# Patient Record
Sex: Female | Born: 1955 | Race: Black or African American | Hispanic: No | Marital: Married | Smoking: Never smoker
Health system: Southern US, Community
[De-identification: ages and names within clinical notes are randomized; demographics above are authoritative.]

## PROBLEM LIST (undated history)

## (undated) DIAGNOSIS — K750 Abscess of liver: Secondary | ICD-10-CM

## (undated) DIAGNOSIS — I10 Essential (primary) hypertension: Secondary | ICD-10-CM

---

## 1998-09-07 ENCOUNTER — Other Ambulatory Visit: Admission: RE | Admit: 1998-09-07 | Discharge: 1998-09-07 | Payer: Self-pay | Admitting: Obstetrics & Gynecology

## 1999-11-02 ENCOUNTER — Other Ambulatory Visit: Admission: RE | Admit: 1999-11-02 | Discharge: 1999-11-02 | Payer: Self-pay | Admitting: Obstetrics & Gynecology

## 2000-09-20 ENCOUNTER — Encounter: Admission: RE | Admit: 2000-09-20 | Discharge: 2000-11-20 | Payer: Self-pay | Admitting: Internal Medicine

## 2000-11-13 ENCOUNTER — Other Ambulatory Visit: Admission: RE | Admit: 2000-11-13 | Discharge: 2000-11-13 | Payer: Self-pay | Admitting: Obstetrics & Gynecology

## 2002-01-13 ENCOUNTER — Other Ambulatory Visit: Admission: RE | Admit: 2002-01-13 | Discharge: 2002-01-13 | Payer: Self-pay | Admitting: Obstetrics & Gynecology

## 2003-02-17 ENCOUNTER — Other Ambulatory Visit: Admission: RE | Admit: 2003-02-17 | Discharge: 2003-02-17 | Payer: Self-pay | Admitting: Obstetrics & Gynecology

## 2004-03-16 ENCOUNTER — Other Ambulatory Visit: Admission: RE | Admit: 2004-03-16 | Discharge: 2004-03-16 | Payer: Self-pay | Admitting: Obstetrics & Gynecology

## 2005-07-21 ENCOUNTER — Other Ambulatory Visit: Admission: RE | Admit: 2005-07-21 | Discharge: 2005-07-21 | Payer: Self-pay | Admitting: Obstetrics & Gynecology

## 2012-07-24 ENCOUNTER — Other Ambulatory Visit: Payer: Self-pay | Admitting: Radiology

## 2012-08-05 ENCOUNTER — Other Ambulatory Visit: Payer: Self-pay

## 2012-11-01 ENCOUNTER — Telehealth: Payer: Self-pay | Admitting: Hematology & Oncology

## 2012-11-01 NOTE — Telephone Encounter (Signed)
Left pt message to call and schedule appointment °

## 2012-11-01 NOTE — Telephone Encounter (Signed)
Pt aware of 12-02-12 appointment

## 2012-12-02 ENCOUNTER — Ambulatory Visit (HOSPITAL_BASED_OUTPATIENT_CLINIC_OR_DEPARTMENT_OTHER): Admitting: Hematology & Oncology

## 2012-12-02 ENCOUNTER — Other Ambulatory Visit (HOSPITAL_BASED_OUTPATIENT_CLINIC_OR_DEPARTMENT_OTHER): Admitting: Lab

## 2012-12-02 ENCOUNTER — Ambulatory Visit

## 2012-12-02 VITALS — BP 150/83 | HR 76 | Temp 98.4°F | Resp 16 | Ht 67.0 in | Wt 181.0 lb

## 2012-12-02 DIAGNOSIS — D693 Immune thrombocytopenic purpura: Secondary | ICD-10-CM

## 2012-12-02 LAB — CBC WITH DIFFERENTIAL (CANCER CENTER ONLY)
BASO#: 0 10*3/uL (ref 0.0–0.2)
HCT: 40.9 % (ref 34.8–46.6)
HGB: 13.8 g/dL (ref 11.6–15.9)
LYMPH#: 1.6 10*3/uL (ref 0.9–3.3)
MONO#: 0.4 10*3/uL (ref 0.1–0.9)
NEUT%: 66.7 % (ref 39.6–80.0)
WBC: 6.4 10*3/uL (ref 3.9–10.0)

## 2012-12-02 NOTE — Progress Notes (Signed)
This office note has been dictated.

## 2012-12-03 NOTE — Progress Notes (Signed)
CC:   Lisa Housekeeper, MD  DIAGNOSIS:  Transient thrombocytopenia.  HISTORY OF PRESENT ILLNESS:  Lisa Bauer is a very nice 57 year old African American female.  She has been very healthy.  She is followed by Dr. Donette Larry.  She has a history of high blood pressure.  She had some routine lab work done for her high blood pressure followup.  This was back in May.  Dr. Donette Larry saw that her platelet count was mildly depressed.  At that point in time, her CBC showed a white count of 5.8, hemoglobin 12.4, hematocrit 36.9, platelet count 137.  MCV was 89.  She had a normal white cell differential.  Lisa Bauer has not had any problems with bleeding or bruising.  She has had no weight loss or weight gain.  She is not a vegetarian.  There has been no change in medications.  She has had no dysphagia or odynophagia.  Of note, I think back in April, her CBC was done which showed a white cell count of 4.2, hemoglobin 13.1, hematocrit 39.1, and platelet count 138.  At that point in time, she had normal electrolytes.  Again, she was kindly referred to the [facility] for an evaluation.  She does have her routine mammograms.  She had her colonoscopy back in 2009.  She has had no rashes.  There has been no change in bowel or bladder habits.  There has been no bony pain.  She has had no dysphagia or odynophagia.  PAST MEDICAL HISTORY:  Hypertension.  ALLERGIES:  None.  MEDICATIONS:  Lisinopril 20 mg p.o. daily.  SOCIAL HISTORY:  Negative for tobacco use.  There is rare alcohol use. She has no obvious occupational exposures.  She previously worked for [employer].  FAMILY HISTORY:  Negative for any hematologic issue.  There is a cousin with sickle cell trait.  There is hypertension in the family.  REVIEW OF SYSTEMS:  As stated in history of present illness.  No additional findings noted on a 12-system review.  PHYSICAL EXAMINATION:  General:  This is a well-developed,  well- nourished African American female in no obvious distress.  Vital signs: Temperature 98.4, pulse 76, respiratory rate 16, blood pressure 150/83. Weight is 181.  Head and Neck:  Normocephalic, atraumatic skull.  There are no ocular or oral lesions.  There are no palpable cervical or supraclavicular lymph nodes.  Lungs:  Clear bilaterally.  Cardiac: Regular rate and rhythm with a normal S1, S2.  There are no murmurs, rubs or bruits.  Abdomen:  Soft with good bowel sounds.  There is no palpable abdominal mass.  There is no fluid wave.  There is no palpable hepatosplenomegaly.  Back:  No tenderness over the spine, ribs, or hips. Extremities:  No clubbing, cyanosis or edema.  She has good range motion of her joints.  Neurologic:  No focal neurological deficits.  Skin:  No rashes, ecchymosis, or petechia.  LABORATORY STUDIES:  White cell count 6.4, hemoglobin 13.8, hematocrit 40.9, platelet count 148.  Peripheral smear shows a normochromic, normocytic population of red blood cells.  There are no nucleated red blood cells.  There are no teardrop cells.  There is no __________ target cells.  There is no spherocytes or schistocytes.  White cells appear normal in morphology and maturation.  There is no immature myeloid or lymphoid forms.  I see no atypical lymphocytes.  There is no hypersegmented polys.  There is no blasts.  Platelets are adequate in number and  size.  Platelets are well granulated.  She may have a few large platelets.  IMPRESSION:  Lisa Bauer is a very nice 57 year old African female with minimal thrombocytopenia.  This is transient.  I cannot find anything on her physical exam or on the blood smear that would suggest an underlying hematologic issue.  I just think that Lisa Bauer will have some fluctuations of her platelet count as a "norm."  I do not see a need for a bone marrow biopsy.  I do not see need for __________ radiological test.  I forgot to mention that  there is no risk factors for HIV or hepatitis. As such, I do not see that cirrhosis or splenomegaly is going to be an issue here.  Again, Lisa Bauer's platelet count is normal today.  I suspect that she is going to have some fluctuations with her platelets.  Her white cells were okay.  She is not anemic.  It is also very important for [place] to consider.  I spent a good hour with Lisa Bauer.  We had a very nice talk.  I reviewed her lab work with her.  I explained my recommendations for her.  As nice as Lisa Bauer is, I do not think we need to get her back to the office.    ______________________________ Josph Macho, M.D. PRE/MEDQ  D:  12/02/2012  T:  12/03/2012  Job:  [id number]

## 2013-08-28 ENCOUNTER — Other Ambulatory Visit: Payer: Self-pay

## 2014-10-01 ENCOUNTER — Other Ambulatory Visit: Payer: Self-pay | Admitting: Internal Medicine

## 2014-10-01 ENCOUNTER — Ambulatory Visit
Admission: RE | Admit: 2014-10-01 | Discharge: 2014-10-01 | Disposition: A | Discharge status: Home / Self Care | Source: Ambulatory Visit | Attending: Internal Medicine | Admitting: Internal Medicine

## 2014-10-01 DIAGNOSIS — M545 Low back pain: Secondary | ICD-10-CM

## 2016-05-02 ENCOUNTER — Other Ambulatory Visit: Payer: Self-pay | Admitting: Internal Medicine

## 2016-05-02 DIAGNOSIS — R221 Localized swelling, mass and lump, neck: Secondary | ICD-10-CM

## 2016-05-08 ENCOUNTER — Ambulatory Visit
Admission: RE | Admit: 2016-05-08 | Discharge: 2016-05-08 | Disposition: A | Discharge status: Home / Self Care | Source: Ambulatory Visit | Attending: Internal Medicine | Admitting: Internal Medicine

## 2016-05-08 DIAGNOSIS — R221 Localized swelling, mass and lump, neck: Secondary | ICD-10-CM

## 2016-09-23 ENCOUNTER — Emergency Department (HOSPITAL_COMMUNITY)

## 2016-09-23 ENCOUNTER — Other Ambulatory Visit: Payer: Self-pay

## 2016-09-23 ENCOUNTER — Encounter (HOSPITAL_COMMUNITY): Payer: Self-pay | Admitting: *Deleted

## 2016-09-23 ENCOUNTER — Emergency Department (HOSPITAL_COMMUNITY)
Admission: EM | Admit: 2016-09-23 | Discharge: 2016-09-24 | Disposition: A | Discharge status: Home / Self Care | Attending: Emergency Medicine | Admitting: Emergency Medicine

## 2016-09-23 DIAGNOSIS — M79662 Pain in left lower leg: Secondary | ICD-10-CM | POA: Insufficient documentation

## 2016-09-23 DIAGNOSIS — Z79899 Other long term (current) drug therapy: Secondary | ICD-10-CM | POA: Diagnosis not present

## 2016-09-23 DIAGNOSIS — I10 Essential (primary) hypertension: Secondary | ICD-10-CM | POA: Diagnosis not present

## 2016-09-23 DIAGNOSIS — R079 Chest pain, unspecified: Secondary | ICD-10-CM | POA: Diagnosis not present

## 2016-09-23 HISTORY — DX: Essential (primary) hypertension: I10

## 2016-09-23 LAB — CBC
HEMATOCRIT: 40.5 % (ref 36.0–46.0)
Hemoglobin: 13.5 g/dL (ref 12.0–15.0)
MCH: 29.2 pg (ref 26.0–34.0)
MCHC: 33.3 g/dL (ref 30.0–36.0)
MCV: 87.5 fL (ref 78.0–100.0)
Platelets: 153 10*3/uL (ref 150–400)
RBC: 4.63 MIL/uL (ref 3.87–5.11)
RDW: 13.7 % (ref 11.5–15.5)
WBC: 6.3 10*3/uL (ref 4.0–10.5)

## 2016-09-23 LAB — BASIC METABOLIC PANEL
Anion gap: 9 (ref 5–15)
BUN: 13 mg/dL (ref 6–20)
CO2: 28 mmol/L (ref 22–32)
CREATININE: 0.61 mg/dL (ref 0.44–1.00)
Calcium: 8.9 mg/dL (ref 8.9–10.3)
Chloride: 102 mmol/L (ref 101–111)
GFR calc Af Amer: >60 mL/min (ref >60)
GFR calc non Af Amer: >60 mL/min (ref >60)
GLUCOSE: 112 mg/dL — AB (ref 65–99)
Potassium: 3.5 mmol/L (ref 3.5–5.1)
Sodium: 139 mmol/L (ref 135–145)

## 2016-09-23 LAB — I-STAT TROPONIN, ED
TROPONIN I, POC: 0 ng/mL (ref 0.00–0.08)
Troponin i, poc: 0 ng/mL (ref 0.00–0.08)

## 2016-09-23 LAB — D-DIMER, QUANTITATIVE (NOT AT ARMC): D DIMER QUANT: 0.7 ug{FEU}/mL — AB (ref 0.00–0.50)

## 2016-09-23 NOTE — ED Provider Notes (Signed)
[facility] DEPT Provider Note   CSN: [id number] Arrival date & time: 09/23/16  1831     History   Chief Complaint Chief Complaint  Patient presents with  . Chest Pain    HPI Lisa Bauer is a 61 y.o. female.  HPI  Off and on for 3 days, comes and goes, sharp pain in the middle of the chest.  Feels like the sharp pain "catches my breath. " But doesn't necessarily feel shortness of breath, no dizziness.  No nausea or vomiting.  Sometimes radiates to shoulder and arm.  No sweating. Noticed some calf pain on left side today.  Nothing makes chest pain better or worse, not worse with exertion. Not worse with deep breaths.  Had pain like this 30 years ago and came to ED.  Pain 5/10 at the worst, lasts for minute or two. Not constant.   No chol, DM, smoking, no other drug use No recent surgeries, car/airplan trips, estrogen use, hx DVT/PE  Father died of MI in 31s    Past Medical History:  Diagnosis Date  . Hypertension     There are no active problems to display for this patient.   History reviewed. No pertinent surgical history.  OB History    No data available       Home Medications    Prior to Admission medications   Medication Sig Start Date End Date Taking? Authorizing Provider  lisinopril-hydrochlorothiazide (PRINZIDE,ZESTORETIC) 20-25 MG tablet Take 1 tablet by mouth daily. 07/14/16  Yes Historical Provider, MD    Family History History reviewed. No pertinent family history.  Social History Social History  Substance Use Topics  . Smoking status: Never Smoker  . Smokeless tobacco: Not on file  . Alcohol use No     Allergies   Patient has no known allergies.   Review of Systems Review of Systems  Constitutional: Negative for fever.  HENT: Negative for sore throat.   Eyes: Negative for visual disturbance.  Respiratory: Negative for cough and shortness of breath.   Cardiovascular: Positive for chest pain. Negative for leg swelling.    Gastrointestinal: Negative for abdominal pain, diarrhea, nausea and vomiting.  Genitourinary: Negative for difficulty urinating and dysuria.  Musculoskeletal: Negative for neck pain.  Skin: Negative for rash.  Neurological: Negative for syncope and headaches.     Physical Exam Updated Vital Signs BP (!) 140/96   Pulse (!) 53   Temp 98.4 F (36.9 C) (Oral)   Resp 13   Wt 175 lb (79.4 kg)   SpO2 98%   BMI 27.41 kg/m   Physical Exam  Constitutional: She is oriented to person, place, and time. She appears well-developed and well-nourished. No distress.  HENT:  Head: Normocephalic and atraumatic.  Eyes: Conjunctivae and EOM are normal.  Neck: Normal range of motion.  Cardiovascular: Normal rate, regular rhythm, normal heart sounds and intact distal pulses.  Exam reveals no gallop and no friction rub.   No murmur heard. Pulmonary/Chest: Effort normal and breath sounds normal. No respiratory distress. She has no wheezes. She has no rales.  Abdominal: Soft. She exhibits no distension. There is no tenderness. There is no guarding.  Musculoskeletal: She exhibits no edema or tenderness.  Mild tenderness left lower calf on initial exam  Neurological: She is alert and oriented to person, place, and time.  Skin: Skin is warm and dry. No rash noted. She is not diaphoretic. No erythema.  Nursing note and vitals reviewed.    ED Treatments /  Results  Labs (all labs ordered are listed, but only abnormal results are displayed) Labs Reviewed  BASIC METABOLIC PANEL - Abnormal; Notable for the following:       Result Value   Glucose, Bld 112 (*)    All other components within normal limits  D-DIMER, QUANTITATIVE (NOT AT [facility]) - Abnormal; Notable for the following:    D-Dimer, Quant 0.70 (*)    All other components within normal limits  CBC  I-STAT TROPOININ, ED  I-STAT TROPOININ, ED    EKG  EKG Interpretation  Date/Time:  Saturday September 23 2016 18:34:35 EDT Ventricular Rate:   65 PR Interval:  164 QRS Duration: 100 QT Interval:  430 QTC Calculation: 447 R Axis:   -9 Text Interpretation:  Normal sinus rhythm Right atrial enlargement Incomplete right bundle branch block Moderate voltage criteria for LVH, may be normal variant Borderline ECG No previous ECGs available Confirmed by [facility] MD, Katyra Tomassetti ([id number]) on 09/23/2016 10:44:58 PM       Radiology Dg Chest 2 View  Result Date: 09/23/2016 CLINICAL DATA:  61 year old female with history of intermittent midsternal chest pain for the past 2-3 days. EXAM: CHEST  2 VIEW COMPARISON:  No priors. FINDINGS: Lung volumes are normal. No consolidative airspace disease. No pleural effusions. No pneumothorax. No pulmonary nodule or mass noted. Pulmonary vasculature and the cardiomediastinal silhouette are within normal limits. IMPRESSION: No radiographic evidence of acute cardiopulmonary disease. Electronically Signed   By: Vinnie Langton M.D.   On: 09/23/2016 19:23   Ct Angio Chest Pe W And/or Wo Contrast  Result Date: 09/24/2016 CLINICAL DATA:  Initial evaluation for intermittent chest pain for 3 days. 80 cc of Isovue 370. EXAM: CT ANGIOGRAPHY CHEST WITH CONTRAST TECHNIQUE: Multidetector CT imaging of the chest was performed using the standard protocol during bolus administration of intravenous contrast. Multiplanar CT image reconstructions and MIPs were obtained to evaluate the vascular anatomy. CONTRAST:  Prior radiograph from 09/23/2016. COMPARISON:  None. FINDINGS: Cardiovascular: Ascending aorta dilated up to 4.1 cm in diameter. Minimal plaque within the aortic arch. Mild cardiomegaly. No pericardial effusion. Pulmonary arterial tree adequately opacified for evaluation. Main pulmonary artery measures within normal limits for size. No filling defect to suggest acute pulmonary embolism. Re-formatted imaging confirms these findings. Mediastinum/Nodes: Partially visualized thyroid is normal. No pathologically enlarged mediastinal,  hilar, or axillary lymph nodes identified. Esophagus within normal limits. Lungs/Pleura: Tracheobronchial tree is patent. Mild hazy subsegmental atelectasis seen dependently within the lower lobes bilaterally. Lungs are otherwise clear. No focal infiltrates. No pulmonary edema or pleural effusion. No worrisome pulmonary nodule or mass. No pneumothorax. Upper Abdomen: Visualized upper abdomen within normal limits. Musculoskeletal: No acute osseus abnormality. No worrisome lytic or blastic osseous lesions. Few scattered breast calcifications noted. Review of the MIP images confirms the above findings. IMPRESSION: 1. No CT evidence for acute pulmonary embolism. 2. No other acute cardiopulmonary abnormality identified. 3. 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 Recommend annual imaging followup by CTA or MRA. This recommendation follows 2010 ACCF/AHA/AATS/ACR/ASA/SCA/SCAI/SIR/STS/SVM Guidelines for the Diagnosis and Management of Patients with Thoracic Aortic Disease. Circulation. 2010; 121: Y248-G500 Electronically Signed   By: Jeannine Boga M.D.   On: 09/24/2016 02:16    Procedures Procedures (including critical care time)  Medications Ordered in ED Medications  iopamidol (ISOVUE-370) 76 % injection (100 mLs  Contrast Given 09/24/16 0059)  enoxaparin (LOVENOX) injection 80 mg (80 mg Subcutaneous Given 09/24/16 0145)     Initial Impression / Assessment and Plan /  ED Course  I have reviewed the triage vital signs and the nursing notes.  Pertinent labs & imaging results that were available during my care of the patient were reviewed by me and considered in my medical decision making (see chart for details).     62 year old female with a history of hypertension, family history of coronary artery disease in her father in his 26s, presents with concern for intermittent chest pain over the last 3 days. EKG shows incomplete right bundle branch block without prior for comparison.  Given patient  reported left calf pain, with sharp chest pain, ordered d-dimer to evaluate for pulmonary embolus. D-dimer elevated, and CT PE study was done which showed no evidence of PE, however does show dilation of the ascending aorta which will require annual follow-up imaging. Discussed these results with patient.   Given patient's d-dimer, and initial left calf pain, discussed that the conservative plan would be to evaluate for DVT. Gave patient an empiric dose of Lovenox, and recommend return to the emergency department for further evaluation.  Patient is chest pain-free in the emergency department. She is a heart score of 3.  She has no exertional symptoms. Feel she is appropriate for outpatient management with her cardiologist, Dr. Tamala Julian. Recommend close follow-up with cardiology, as well as follow up with primary care physician and discussion of annual aortic imaging. Patient discharged in stable condition with understanding of reasons to return.   Final Clinical Impressions(s) / ED Diagnoses   Final diagnoses:  Chest pain, unspecified type  Pain of left lower leg    New Prescriptions Discharge Medication List as of 09/24/2016  2:20 AM       Gareth Morgan, MD 09/24/16 [phone]

## 2016-09-23 NOTE — ED Triage Notes (Signed)
Pt reports mid sharp chest pains intermittent for several days. Denies sob or n/v. No acute distress is noted at triage, ekg done.

## 2016-09-24 ENCOUNTER — Ambulatory Visit (HOSPITAL_BASED_OUTPATIENT_CLINIC_OR_DEPARTMENT_OTHER)
Admission: RE | Admit: 2016-09-24 | Discharge: 2016-09-24 | Disposition: A | Discharge status: Home / Self Care | Source: Ambulatory Visit | Attending: Emergency Medicine | Admitting: Emergency Medicine

## 2016-09-24 ENCOUNTER — Encounter (HOSPITAL_COMMUNITY): Payer: Self-pay | Admitting: Radiology

## 2016-09-24 ENCOUNTER — Emergency Department (HOSPITAL_COMMUNITY)

## 2016-09-24 DIAGNOSIS — M7989 Other specified soft tissue disorders: Secondary | ICD-10-CM | POA: Diagnosis not present

## 2016-09-24 DIAGNOSIS — M79605 Pain in left leg: Secondary | ICD-10-CM

## 2016-09-24 DIAGNOSIS — M79609 Pain in unspecified limb: Secondary | ICD-10-CM | POA: Diagnosis not present

## 2016-09-24 MED ORDER — ENOXAPARIN SODIUM 80 MG/0.8ML ~~LOC~~ SOLN
1.0000 mg/kg | Freq: Once | SUBCUTANEOUS | Status: AC
  Administered 2016-09-24: 02:00:00 80 mg via SUBCUTANEOUS
  Filled 2016-09-24: qty 0.8

## 2016-09-24 MED ORDER — IOPAMIDOL (ISOVUE-370) INJECTION 76%
INTRAVENOUS | Status: AC
  Administered 2016-09-24: 100 mL
  Filled 2016-09-24: qty 100

## 2016-09-24 NOTE — Progress Notes (Signed)
VASCULAR LAB PRELIMINARY  PRELIMINARY  PRELIMINARY  PRELIMINARY  Left lower extremity venous duplex completed.    Preliminary report:  There is no obvious evidence of DVT or SVT noted in the left lower extremity.   Tajah Noguchi, RVT 09/24/2016, 8:31 AM

## 2016-09-24 NOTE — ED Notes (Signed)
Patient transported to CT 

## 2016-09-24 NOTE — Discharge Instructions (Signed)
You have been evaluated in the emergency department for chest pain and leg pain. I see no sign of heart attack, blood clot in the lung, collapsed lung, pneumonia, or other immediate life-threatening cause of your chest pain at this time.  I recommend very close follow-up with your cardiologist for possible outpatient stress test and evaluation for heart disease.  Regarding your leg pain, I am reassured that it has self resolved, however in the setting of leg pain and an elevated d-dimer (blood clot number)-- the conservative course of action would be to obtain an ultrasound of your leg to evaluate for deep vein thrombosis tomorrow morning. Please return to the emergency department for increased chest pain, shortness of breath or other concerns.

## 2016-09-24 NOTE — ED Notes (Signed)
ED Provider at bedside. 

## 2016-09-24 NOTE — ED Notes (Signed)
Pt returned from CT and connected to the monitor 

## 2016-09-26 ENCOUNTER — Telehealth: Payer: Self-pay | Admitting: *Deleted

## 2016-09-26 DIAGNOSIS — I10 Essential (primary) hypertension: Secondary | ICD-10-CM

## 2016-09-26 DIAGNOSIS — R9431 Abnormal electrocardiogram [ECG] [EKG]: Secondary | ICD-10-CM

## 2016-09-26 DIAGNOSIS — R0789 Other chest pain: Secondary | ICD-10-CM

## 2016-09-26 NOTE — Telephone Encounter (Signed)
-----   Message from Belva Crome, MD sent at 09/25/2016  6:45 PM EDT ----- Regarding: Chest pain Recently seen in the emergency room with chest pain.  Has abnormal EKG suggesting LVH.  Needs to be scheduled for 2-D Doppler echocardiogram because of history of hypertension and EKG appearance of LVH.  Needs stress Myoview performed to rule out CAD in the setting of chest pain.

## 2016-09-26 NOTE — Telephone Encounter (Signed)
Spoke with pt and went over recommendations per Dr. Tamala Julian.  Went over instructions for Myoview.  Pt verbalized understanding and was in agreement with this plan.

## 2016-10-05 ENCOUNTER — Telehealth (HOSPITAL_COMMUNITY): Payer: Self-pay | Admitting: *Deleted

## 2016-10-05 NOTE — Telephone Encounter (Signed)
Patient given detailed instructions per Myocardial Perfusion Study Information Sheet for the test on 10/10/16 Patient notified to arrive 15 minutes early and that it is imperative to arrive on time for appointment to keep from having the test rescheduled.  If you need to cancel or reschedule your appointment, please call the office within 24 hours of your appointment. Failure to do so may result in a cancellation of your appointment, and a $50 no show fee. Patient verbalized understanding. Juliya Magill Jacqueline    

## 2016-10-10 ENCOUNTER — Ambulatory Visit (HOSPITAL_BASED_OUTPATIENT_CLINIC_OR_DEPARTMENT_OTHER)

## 2016-10-10 ENCOUNTER — Ambulatory Visit (HOSPITAL_COMMUNITY): Attending: Cardiovascular Disease

## 2016-10-10 ENCOUNTER — Other Ambulatory Visit: Payer: Self-pay

## 2016-10-10 DIAGNOSIS — R079 Chest pain, unspecified: Secondary | ICD-10-CM | POA: Diagnosis not present

## 2016-10-10 DIAGNOSIS — R9431 Abnormal electrocardiogram [ECG] [EKG]: Secondary | ICD-10-CM | POA: Insufficient documentation

## 2016-10-10 DIAGNOSIS — Z8249 Family history of ischemic heart disease and other diseases of the circulatory system: Secondary | ICD-10-CM | POA: Diagnosis not present

## 2016-10-10 DIAGNOSIS — R0789 Other chest pain: Secondary | ICD-10-CM

## 2016-10-10 DIAGNOSIS — I10 Essential (primary) hypertension: Secondary | ICD-10-CM | POA: Insufficient documentation

## 2016-10-10 LAB — MYOCARDIAL PERFUSION IMAGING
CHL CUP NUCLEAR SRS: 5
CHL CUP RESTING HR STRESS: 51 {beats}/min
CHL RATE OF PERCEIVED EXERTION: 18
CSEPED: 10 min
CSEPEDS: 30 s
Estimated workload: 12.5 METS
LV dias vol: 105 mL (ref 46–106)
LVSYSVOL: 34 mL
MPHR: 159 {beats}/min
NUC STRESS TID: 0.92
Peak HR: 139 {beats}/min
Percent HR: 87 %
RATE: 0.29
SDS: 3
SSS: 8

## 2016-10-10 MED ORDER — TECHNETIUM TC 99M TETROFOSMIN IV KIT
10.6000 | PACK | Freq: Once | INTRAVENOUS | Status: AC | PRN
  Administered 2016-10-10: 10.6 via INTRAVENOUS
  Filled 2016-10-10: qty 11

## 2016-10-10 MED ORDER — TECHNETIUM TC 99M TETROFOSMIN IV KIT
32.4000 | PACK | Freq: Once | INTRAVENOUS | Status: AC | PRN
  Administered 2016-10-10: 32.4 via INTRAVENOUS
  Filled 2016-10-10: qty 33

## 2016-10-20 ENCOUNTER — Ambulatory Visit: Admitting: Interventional Cardiology

## 2016-12-11 ENCOUNTER — Encounter: Payer: Self-pay | Admitting: Psychology

## 2017-04-03 ENCOUNTER — Encounter: Payer: Self-pay | Admitting: Psychology

## 2017-04-03 ENCOUNTER — Ambulatory Visit (INDEPENDENT_AMBULATORY_CARE_PROVIDER_SITE_OTHER): Admitting: Psychology

## 2017-04-03 DIAGNOSIS — F410 Panic disorder [episodic paroxysmal anxiety] without agoraphobia: Secondary | ICD-10-CM | POA: Diagnosis not present

## 2017-04-03 NOTE — Progress Notes (Signed)
NEUROPSYCHOLOGICAL INTERVIEW (CPT: [id number])  Name: Lisa Bauer Date of Birth: [birth year] Date of Interview: 04/03/2017  Reason for Referral:  Lisa Bauer is a 61 y.o. female who is referred for neuropsychological evaluation by Dr. Wenda Low, MD, of [facility] at [facility] due to concerns about episodic cognitive impairment. This patient is unaccompanied in the office for today's visit.  History of Presenting Problem:  Lisa Bauer reported that she had an episode while driving alone this summer to [place] wherein she all of a sudden felt an overwhelming feeling of losing control of the car, fear of blacking out, fear of crashing, and heart palpitations. She pulled over and then got something to eat as she had not eaten that day. After eating, she still felt "weird" so she returned home and took a nap. The next day she made the drive to [place] and though she had some similar sensations she felt she could control it. She subsequently had a nice weekend with no problems and drove herself home fine. Over the subsequent weeks, she had a few more episodes of similar sensations, with fear of losing control and a sensation that the car was floating and not on the road. Episodes mainly occurred in the car when she was driving but she does recall having similar feelings during a meeting in church on one occasion; she felt like she was losing her breath and her heart was racing. It came on quickly and then passed. After the first episode, she felt "in a fog" for a few weeks afterwards and felt she had word finding difficulty and some retrieval problems but it resolved completely. She did not experience headache, nausea/vomiting, acute confusion or disorientation during or after any of the episodes. She reports none of the episodes were witnessed (although it seems the one in a church meeting would have been witnessed) but she does not believe she had any abnormal motor movements. She could  not identify a trigger for the episodes, and denies any increase in stress level. She saw her PCP who told her he felt it was likely panic attack. She was prescribed Xanax as needed which she did take and did find helpful. All of a sudden she had no more episodes and has not had any since.  She has no prior history of seizure, headache or head injury. There is no family history of seizures. There is no family history of dementia. She does not have any ongoing cognitive complaints. She denies difficulty with word retrieval, memory, visual spatial navigation or attention/concentration. She manages all complex ADLs with no difficulty. She denies any physical complaints. She has no problems with sleep. Her mood is "always great". She denies any current psychosocial stressors.  She does have a history of anxiety about 20 years ago which sounds more like generalized anxiety than panic disorder. She did see a mental health professional and she thinks she was prescribed something which she took for a short while. She has no history of depression.   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 Hypertension      Current Medications:  Calcium-Vitamin D 600-400 mg Vitamin D 2000 iu Lisinopril-HCTZ 20-25 mg  Behavioral Observations:   Appearance: Very neatly dressed  and groomed Gait: Ambulated independently, no abnormalities observed Speech: Fluent; normal rate, rhythm and volume. No word finding difficulty. Thought process: Linear, goal directed Affect: Full, bright, euthymic Interpersonal: Very pleasant, appropriate   Clinical Impressions: Panic attacks without agoraphobia, now resolved. While she does not  have a history of prior panic attacks or disorder, she does have a history of generalized anxiety earlier in her life (about 20 years ago). The patient now presents with acute episodes of what sound like classic panic attack symptoms (e.g., sudden onset of fear of losing control, heart palpitations, breathlessness and possible derealization). She responded well to conventional treatment for panic attacks and episodes completely resolved and have not recurred. As such, I believe it is more likely that she experienced panic attacks as opposed to a neurologic event such as partial seizure.  Because she has no other symptoms concerning for neurologic disorder, and because she reports the episodes have completely resolved, I advised that it is not necessary to undergo formal neurocognitive evaluation at this time. I provided education regarding panic attacks and cognitive-behavioral strategies to use if they recur. I advised that if she has any episodes in the future with symptoms concerning for seizure (e.g., headache or significant fatigue afterwards, loss of consciousness, abnormal movements or motor automatisms), she can see our epileptologist, Dr. Ellouise Newer, for neurologic consultation.

## 2017-04-10 ENCOUNTER — Encounter

## 2017-04-19 ENCOUNTER — Encounter: Admitting: Psychology

## 2017-08-09 DIAGNOSIS — M7070 Other bursitis of hip, unspecified hip: Secondary | ICD-10-CM | POA: Insufficient documentation

## 2018-01-03 DIAGNOSIS — K750 Abscess of liver: Secondary | ICD-10-CM

## 2018-01-03 HISTORY — DX: Abscess of liver: K75.0

## 2018-01-09 ENCOUNTER — Inpatient Hospital Stay (HOSPITAL_COMMUNITY)
Admission: EM | Admit: 2018-01-09 | Discharge: 2018-01-24 | DRG: 871 | Disposition: A | Discharge status: Home Health | Attending: Family Medicine | Admitting: Family Medicine

## 2018-01-09 ENCOUNTER — Emergency Department (HOSPITAL_COMMUNITY)

## 2018-01-09 ENCOUNTER — Other Ambulatory Visit: Payer: Self-pay

## 2018-01-09 ENCOUNTER — Inpatient Hospital Stay (HOSPITAL_COMMUNITY)

## 2018-01-09 ENCOUNTER — Encounter (HOSPITAL_COMMUNITY): Payer: Self-pay | Admitting: Emergency Medicine

## 2018-01-09 DIAGNOSIS — A409 Streptococcal sepsis, unspecified: Secondary | ICD-10-CM | POA: Diagnosis present

## 2018-01-09 DIAGNOSIS — D62 Acute posthemorrhagic anemia: Secondary | ICD-10-CM | POA: Diagnosis not present

## 2018-01-09 DIAGNOSIS — J690 Pneumonitis due to inhalation of food and vomit: Secondary | ICD-10-CM | POA: Diagnosis not present

## 2018-01-09 DIAGNOSIS — I10 Essential (primary) hypertension: Secondary | ICD-10-CM | POA: Diagnosis present

## 2018-01-09 DIAGNOSIS — R6521 Severe sepsis with septic shock: Secondary | ICD-10-CM | POA: Diagnosis present

## 2018-01-09 DIAGNOSIS — D696 Thrombocytopenia, unspecified: Secondary | ICD-10-CM | POA: Diagnosis not present

## 2018-01-09 DIAGNOSIS — T380X5A Adverse effect of glucocorticoids and synthetic analogues, initial encounter: Secondary | ICD-10-CM | POA: Diagnosis not present

## 2018-01-09 DIAGNOSIS — E872 Acidosis: Secondary | ICD-10-CM | POA: Diagnosis present

## 2018-01-09 DIAGNOSIS — T464X5A Adverse effect of angiotensin-converting-enzyme inhibitors, initial encounter: Secondary | ICD-10-CM | POA: Diagnosis present

## 2018-01-09 DIAGNOSIS — D649 Anemia, unspecified: Secondary | ICD-10-CM | POA: Diagnosis present

## 2018-01-09 DIAGNOSIS — E876 Hypokalemia: Secondary | ICD-10-CM | POA: Diagnosis present

## 2018-01-09 DIAGNOSIS — E860 Dehydration: Secondary | ICD-10-CM | POA: Diagnosis not present

## 2018-01-09 DIAGNOSIS — R7989 Other specified abnormal findings of blood chemistry: Secondary | ICD-10-CM | POA: Diagnosis present

## 2018-01-09 DIAGNOSIS — R1084 Generalized abdominal pain: Secondary | ICD-10-CM

## 2018-01-09 DIAGNOSIS — N179 Acute kidney failure, unspecified: Secondary | ICD-10-CM | POA: Diagnosis present

## 2018-01-09 DIAGNOSIS — B955 Unspecified streptococcus as the cause of diseases classified elsewhere: Secondary | ICD-10-CM | POA: Diagnosis not present

## 2018-01-09 DIAGNOSIS — R5381 Other malaise: Secondary | ICD-10-CM | POA: Diagnosis present

## 2018-01-09 DIAGNOSIS — I5030 Unspecified diastolic (congestive) heart failure: Secondary | ICD-10-CM | POA: Diagnosis not present

## 2018-01-09 DIAGNOSIS — I471 Supraventricular tachycardia: Secondary | ICD-10-CM | POA: Diagnosis not present

## 2018-01-09 DIAGNOSIS — E871 Hypo-osmolality and hyponatremia: Secondary | ICD-10-CM | POA: Diagnosis not present

## 2018-01-09 DIAGNOSIS — A419 Sepsis, unspecified organism: Secondary | ICD-10-CM | POA: Diagnosis present

## 2018-01-09 DIAGNOSIS — K750 Abscess of liver: Secondary | ICD-10-CM | POA: Diagnosis present

## 2018-01-09 DIAGNOSIS — R601 Generalized edema: Secondary | ICD-10-CM | POA: Diagnosis not present

## 2018-01-09 DIAGNOSIS — N390 Urinary tract infection, site not specified: Secondary | ICD-10-CM | POA: Diagnosis present

## 2018-01-09 DIAGNOSIS — Z95828 Presence of other vascular implants and grafts: Secondary | ICD-10-CM | POA: Diagnosis not present

## 2018-01-09 DIAGNOSIS — R160 Hepatomegaly, not elsewhere classified: Secondary | ICD-10-CM | POA: Diagnosis present

## 2018-01-09 DIAGNOSIS — E8770 Fluid overload, unspecified: Secondary | ICD-10-CM | POA: Diagnosis not present

## 2018-01-09 DIAGNOSIS — R945 Abnormal results of liver function studies: Secondary | ICD-10-CM | POA: Diagnosis not present

## 2018-01-09 DIAGNOSIS — E861 Hypovolemia: Secondary | ICD-10-CM | POA: Diagnosis present

## 2018-01-09 DIAGNOSIS — Z79899 Other long term (current) drug therapy: Secondary | ICD-10-CM

## 2018-01-09 DIAGNOSIS — D65 Disseminated intravascular coagulation [defibrination syndrome]: Secondary | ICD-10-CM | POA: Diagnosis not present

## 2018-01-09 DIAGNOSIS — R7881 Bacteremia: Secondary | ICD-10-CM | POA: Diagnosis not present

## 2018-01-09 DIAGNOSIS — R0600 Dyspnea, unspecified: Secondary | ICD-10-CM

## 2018-01-09 DIAGNOSIS — R509 Fever, unspecified: Secondary | ICD-10-CM | POA: Diagnosis not present

## 2018-01-09 DIAGNOSIS — J9601 Acute respiratory failure with hypoxia: Secondary | ICD-10-CM | POA: Diagnosis not present

## 2018-01-09 DIAGNOSIS — K7689 Other specified diseases of liver: Secondary | ICD-10-CM | POA: Diagnosis not present

## 2018-01-09 DIAGNOSIS — Z978 Presence of other specified devices: Secondary | ICD-10-CM | POA: Diagnosis not present

## 2018-01-09 DIAGNOSIS — N289 Disorder of kidney and ureter, unspecified: Secondary | ICD-10-CM | POA: Diagnosis not present

## 2018-01-09 DIAGNOSIS — B954 Other streptococcus as the cause of diseases classified elsewhere: Secondary | ICD-10-CM | POA: Diagnosis not present

## 2018-01-09 LAB — COMPREHENSIVE METABOLIC PANEL
ALT: 464 U/L — ABNORMAL HIGH (ref 0–44)
AST: 437 U/L — ABNORMAL HIGH (ref 15–41)
Albumin: 2.9 g/dL — ABNORMAL LOW (ref 3.5–5.0)
Alkaline Phosphatase: 86 U/L (ref 38–126)
Anion gap: 15 (ref 5–15)
BILIRUBIN TOTAL: 3.3 mg/dL — AB (ref 0.3–1.2)
BUN: 38 mg/dL — ABNORMAL HIGH (ref 8–23)
CO2: 25 mmol/L (ref 22–32)
Calcium: 7.6 mg/dL — ABNORMAL LOW (ref 8.9–10.3)
Chloride: 87 mmol/L — ABNORMAL LOW (ref 98–111)
Creatinine, Ser: 2.48 mg/dL — ABNORMAL HIGH (ref 0.44–1.00)
GFR, EST AFRICAN AMERICAN: 23 mL/min — AB (ref >60)
GFR, EST NON AFRICAN AMERICAN: 20 mL/min — AB (ref >60)
Glucose, Bld: 116 mg/dL — ABNORMAL HIGH (ref 70–99)
POTASSIUM: 2.8 mmol/L — AB (ref 3.5–5.1)
Sodium: 127 mmol/L — ABNORMAL LOW (ref 135–145)
TOTAL PROTEIN: 6.1 g/dL — AB (ref 6.5–8.1)

## 2018-01-09 LAB — CBC WITH DIFFERENTIAL/PLATELET
BAND NEUTROPHILS: 24 %
BLASTS: 0 %
Basophils Absolute: 0 10*3/uL (ref 0.0–0.1)
Basophils Relative: 0 %
Eosinophils Absolute: 0 10*3/uL (ref 0.0–0.7)
Eosinophils Relative: 0 %
HCT: 36.6 % (ref 36.0–46.0)
HEMOGLOBIN: 12.3 g/dL (ref 12.0–15.0)
LYMPHS PCT: 9 %
Lymphs Abs: 0.9 10*3/uL (ref 0.7–4.0)
MCH: 29.4 pg (ref 26.0–34.0)
MCHC: 33.6 g/dL (ref 30.0–36.0)
MCV: 87.6 fL (ref 78.0–100.0)
MONO ABS: 0.6 10*3/uL (ref 0.1–1.0)
MYELOCYTES: 1 %
Metamyelocytes Relative: 2 %
Monocytes Relative: 6 %
Neutro Abs: 8.4 10*3/uL — ABNORMAL HIGH (ref 1.7–7.7)
Neutrophils Relative %: 58 %
OTHER: 0 %
PLATELETS: 93 10*3/uL — AB (ref 150–400)
PROMYELOCYTES RELATIVE: 0 %
RBC: 4.18 MIL/uL (ref 3.87–5.11)
RDW: 13.7 % (ref 11.5–15.5)
SMEAR REVIEW: DECREASED
WBC MORPHOLOGY: INCREASED
WBC: 9.9 10*3/uL (ref 4.0–10.5)
nRBC: 0 /100 WBC

## 2018-01-09 LAB — BASIC METABOLIC PANEL
Anion gap: 15 (ref 5–15)
BUN: 39 mg/dL — ABNORMAL HIGH (ref 8–23)
CHLORIDE: 95 mmol/L — AB (ref 98–111)
CO2: 22 mmol/L (ref 22–32)
Calcium: 7 mg/dL — ABNORMAL LOW (ref 8.9–10.3)
Creatinine, Ser: 2.43 mg/dL — ABNORMAL HIGH (ref 0.44–1.00)
GFR calc Af Amer: 23 mL/min — ABNORMAL LOW (ref >60)
GFR calc non Af Amer: 20 mL/min — ABNORMAL LOW (ref >60)
Glucose, Bld: 101 mg/dL — ABNORMAL HIGH (ref 70–99)
Potassium: 3.5 mmol/L (ref 3.5–5.1)
Sodium: 132 mmol/L — ABNORMAL LOW (ref 135–145)

## 2018-01-09 LAB — CBC
HCT: 31.5 % — ABNORMAL LOW (ref 36.0–46.0)
Hemoglobin: 10.4 g/dL — ABNORMAL LOW (ref 12.0–15.0)
MCH: 29.4 pg (ref 26.0–34.0)
MCHC: 33 g/dL (ref 30.0–36.0)
MCV: 89 fL (ref 78.0–100.0)
PLATELETS: 58 10*3/uL — AB (ref 150–400)
RBC: 3.54 MIL/uL — AB (ref 3.87–5.11)
RDW: 13.9 % (ref 11.5–15.5)
WBC: 12 10*3/uL — AB (ref 4.0–10.5)

## 2018-01-09 LAB — I-STAT CG4 LACTIC ACID, ED
LACTIC ACID, VENOUS: 3.05 mmol/L — AB (ref 0.5–1.9)
LACTIC ACID, VENOUS: 2.7 mmol/L — AB (ref 0.5–1.9)

## 2018-01-09 LAB — MAGNESIUM: Magnesium: 1.6 mg/dL — ABNORMAL LOW (ref 1.7–2.4)

## 2018-01-09 LAB — CREATININE, SERUM
Creatinine, Ser: 2.32 mg/dL — ABNORMAL HIGH (ref 0.44–1.00)
GFR, EST AFRICAN AMERICAN: 25 mL/min — AB (ref >60)
GFR, EST NON AFRICAN AMERICAN: 21 mL/min — AB (ref >60)

## 2018-01-09 LAB — MRSA PCR SCREENING: MRSA BY PCR: NEGATIVE

## 2018-01-09 LAB — PROTIME-INR
INR: 1.24
PROTHROMBIN TIME: 15.5 s — AB (ref 11.4–15.2)

## 2018-01-09 LAB — LIPASE, BLOOD: Lipase: 24 U/L (ref 11–51)

## 2018-01-09 LAB — GLUCOSE, CAPILLARY: GLUCOSE-CAPILLARY: 103 mg/dL — AB (ref 70–99)

## 2018-01-09 MED ORDER — SODIUM CHLORIDE 0.9 % IV BOLUS (SEPSIS)
1000.0000 mL | Freq: Once | INTRAVENOUS | Status: AC
  Administered 2018-01-09: 1000 mL via INTRAVENOUS

## 2018-01-09 MED ORDER — VANCOMYCIN HCL IN DEXTROSE 1-5 GM/200ML-% IV SOLN
1000.0000 mg | INTRAVENOUS | Status: DC
  Filled 2018-01-09: qty 200

## 2018-01-09 MED ORDER — POTASSIUM CHLORIDE 2 MEQ/ML IV SOLN
INTRAVENOUS | Status: DC
  Administered 2018-01-09 (×2): via INTRAVENOUS
  Filled 2018-01-09 (×3): qty 1000

## 2018-01-09 MED ORDER — VANCOMYCIN HCL IN DEXTROSE 1-5 GM/200ML-% IV SOLN: 1000.0000 mg | Freq: Once | INTRAVENOUS | Status: DC

## 2018-01-09 MED ORDER — METRONIDAZOLE IN NACL 5-0.79 MG/ML-% IV SOLN
500.0000 mg | Freq: Once | INTRAVENOUS | Status: AC
  Administered 2018-01-09: 500 mg via INTRAVENOUS
  Filled 2018-01-09: qty 100

## 2018-01-09 MED ORDER — PIPERACILLIN-TAZOBACTAM 3.375 G IVPB 30 MIN
3.3750 g | Freq: Once | INTRAVENOUS | Status: AC
  Administered 2018-01-09: 3.375 g via INTRAVENOUS
  Filled 2018-01-09: qty 50

## 2018-01-09 MED ORDER — SODIUM CHLORIDE 0.9 % IV BOLUS
1000.0000 mL | Freq: Once | INTRAVENOUS | Status: AC
  Administered 2018-01-09: 1000 mL via INTRAVENOUS

## 2018-01-09 MED ORDER — PIPERACILLIN-TAZOBACTAM 3.375 G IVPB
3.3750 g | Freq: Three times a day (TID) | INTRAVENOUS | Status: DC
  Administered 2018-01-09 – 2018-01-14 (×13): 3.375 g via INTRAVENOUS
  Filled 2018-01-09 (×18): qty 50

## 2018-01-09 MED ORDER — FAMOTIDINE IN NACL 20-0.9 MG/50ML-% IV SOLN
20.0000 mg | Freq: Two times a day (BID) | INTRAVENOUS | Status: DC
  Administered 2018-01-09 – 2018-01-10 (×2): 20 mg via INTRAVENOUS
  Filled 2018-01-09 (×2): qty 50

## 2018-01-09 MED ORDER — HYDROMORPHONE HCL 1 MG/ML IJ SOLN
0.5000 mg | Freq: Once | INTRAMUSCULAR | Status: AC
Start: 2018-01-09 — End: 2018-01-09
  Administered 2018-01-09: 0.5 mg via INTRAVENOUS
  Filled 2018-01-09: qty 1

## 2018-01-09 MED ORDER — KCL-LACTATED RINGERS 20 MEQ/L IV SOLN
INTRAVENOUS | Status: DC
  Filled 2018-01-09: qty 1000

## 2018-01-09 MED ORDER — VANCOMYCIN HCL 10 G IV SOLR
1500.0000 mg | Freq: Once | INTRAVENOUS | Status: AC
  Administered 2018-01-09: 1500 mg via INTRAVENOUS
  Filled 2018-01-09: qty 1500

## 2018-01-09 MED ORDER — ENOXAPARIN SODIUM 30 MG/0.3ML ~~LOC~~ SOLN
30.0000 mg | SUBCUTANEOUS | Status: DC
  Administered 2018-01-09: 30 mg via SUBCUTANEOUS
  Filled 2018-01-09: qty 0.3

## 2018-01-09 MED ORDER — SODIUM CHLORIDE 0.9 % IV BOLUS (SEPSIS)
500.0000 mL | Freq: Once | INTRAVENOUS | Status: AC
  Administered 2018-01-09: 500 mL via INTRAVENOUS

## 2018-01-09 MED ORDER — SODIUM CHLORIDE 0.9 % IV SOLN
250.0000 mL | INTRAVENOUS | Status: DC | PRN
  Administered 2018-01-11: 250 mL via INTRAVENOUS

## 2018-01-09 MED ORDER — ONDANSETRON HCL 4 MG/2ML IJ SOLN
4.0000 mg | Freq: Once | INTRAMUSCULAR | Status: AC
  Administered 2018-01-09: 4 mg via INTRAVENOUS
  Filled 2018-01-09: qty 2

## 2018-01-09 MED ORDER — METRONIDAZOLE IN NACL 5-0.79 MG/ML-% IV SOLN
500.0000 mg | Freq: Three times a day (TID) | INTRAVENOUS | Status: DC
  Administered 2018-01-09 – 2018-01-10 (×2): 500 mg via INTRAVENOUS
  Filled 2018-01-09 (×2): qty 100

## 2018-01-09 MED ORDER — POTASSIUM CHLORIDE 10 MEQ/100ML IV SOLN
10.0000 meq | INTRAVENOUS | Status: AC
  Administered 2018-01-09 (×2): 10 meq via INTRAVENOUS
  Filled 2018-01-09 (×2): qty 100

## 2018-01-09 MED ORDER — ONDANSETRON HCL 4 MG/2ML IJ SOLN
4.0000 mg | Freq: Four times a day (QID) | INTRAMUSCULAR | Status: DC | PRN
  Administered 2018-01-09: 4 mg via INTRAVENOUS
  Filled 2018-01-09: qty 2

## 2018-01-09 MED ORDER — POTASSIUM CHLORIDE 10 MEQ/100ML IV SOLN
10.0000 meq | INTRAVENOUS | Status: DC
  Filled 2018-01-09: qty 100

## 2018-01-09 MED ORDER — ALBUMIN HUMAN 5 % IV SOLN
25.0000 g | Freq: Once | INTRAVENOUS | Status: AC
  Administered 2018-01-09: 25 g via INTRAVENOUS
  Filled 2018-01-09: qty 500

## 2018-01-09 MED ORDER — HYDROMORPHONE HCL 1 MG/ML IJ SOLN
1.0000 mg | INTRAMUSCULAR | Status: DC | PRN
  Administered 2018-01-09: 1 mg via INTRAVENOUS
  Filled 2018-01-09: qty 1

## 2018-01-09 NOTE — Progress Notes (Signed)
eLink Physician-Brief Progress Note Patient Name: Lisa Bauer DOB: [birth year] MRN: [id number]   Date of Service  01/09/2018  HPI/Events of Note  Hypotension  eICU Interventions  5 % Albumin 500 ml iv fluid bolus x 1        Shikita Vaillancourt U Christella App 01/09/2018, 11:40 PM

## 2018-01-09 NOTE — H&P (Addendum)
PULMONARY / CRITICAL CARE MEDICINE   Name: Lisa Bauer MRN: [id number] DOB: 1956/02/22    ADMISSION DATE:  01/09/2018   CC:: malaise , abdominal pain, fever, low blood pressure  HPI:  This patient presents with malaise, achiness epigastric abdominal pain x several days now. Has had fever and chillls more recently. Has had a few bouts of diarrthea and nausea/vomiting. He has no history of liver,pancreatic or gallbaldder disease. She denies recent travel.  Has not had any invasive procedures or surgeries in the past 2-3 months. The patieint was hypotensive on presentation. Her initial lactate was 3.05. Her WBC is within normal limits. Her temp on presentation was 100.8. Her LFTs show mild transaminase elevation to 3.3 and moderate elevation of her LFTs. The patient is basically a very healthy woman. Has not had any recent weight loss. No recent travel. The patient initially had a bP in the 70-80s but it has slowly risen after fluid resuscitation and was most recently 97/65. The patient is alert and does not appear toxic at this time.  Has normal pulse ox and respiratory effort.Marland Kitchen     PAST MEDICAL HISTORY :  She  has a past medical history of Hypertension.  PAST SURGICAL HISTORY: She  has no past surgical history on file.  No Known Allergies  No current facility-administered medications on file prior to encounter.    Current Outpatient Medications on File Prior to Encounter  Medication Sig  . cholecalciferol (VITAMIN D) 1000 units tablet Take 1,000 Units by mouth daily.  . fluconazole (DIFLUCAN) 150 MG tablet Take 150 mg by mouth See admin instructions. Take 1 tablet on 01/07/18. Repeat dose in 3 days (01/10/18)  . lisinopril-hydrochlorothiazide (PRINZIDE,ZESTORETIC) 20-25 MG tablet Take 1 tablet by mouth at bedtime.   . nitrofurantoin, macrocrystal-monohydrate, (MACROBID) 100 MG capsule Take 100 mg by mouth 2 (two) times daily.  . ondansetron (ZOFRAN-ODT) 8 MG disintegrating tablet  Take 8 mg by mouth every 8 (eight) hours as needed for nausea or vomiting.  . phenazopyridine (PYRIDIUM) 200 MG tablet Take 200 mg by mouth 3 (three) times daily as needed for pain.  . TURMERIC PO Take 2 capsules by mouth at bedtime.    FAMILY HISTORY:  Her family history is not on file.  SOCIAL HISTORY: She  reports that she has never smoked. She has never used smokeless tobacco. She reports that she does not drink alcohol or use drugs.  REVIEW OF SYSTEMS:   Review of Systems  Constitutional: Positive for chills, fever and malaise/fatigue.  Eyes: Negative.   Respiratory: Negative.   Cardiovascular: Positive for chest pain. Negative for palpitations and orthopnea.  Gastrointestinal: Positive for abdominal pain, diarrhea, nausea and vomiting. Negative for heartburn.  Genitourinary: Negative.   Musculoskeletal: Positive for myalgias.  Neurological: Positive for weakness. Negative for dizziness and headaches.  Endo/Heme/Allergies: Negative.   Psychiatric/Behavioral: Negative.      VITAL SIGNS: BP 97/67   Pulse 72   Temp 99.8 F (37.7 C) (Axillary)   Resp (!) 21   SpO2 96%           INTAKE / OUTPUT: No intake/output data recorded.  PHYSICAL EXAMINATION: General: Patient is a WDWN BF looking about stated age. Does not appear toxic Neuro:  Alert and oriented, moving alll extr.Sppeec his fluent HEENT: [place]/AT Cardiovascular:  RRR s1s2 Lungs:  Bilateral BS Abdomen:  Soft, BS+, some tenderness in the epigastrium,  Extr: [place]/c/e Skin:  warmand dry  LABS:  BMET Recent Labs  Lab 01/09/18  1151  NA 127*  K 2.8*  CL 87*  CO2 25  BUN 38*  CREATININE 2.48*  GLUCOSE 116*    Electrolytes Recent Labs  Lab 01/09/18 1151 01/09/18 1443  CALCIUM 7.6*  --   MG  --  1.6*    CBC Recent Labs  Lab 01/09/18 1151  WBC 9.9  HGB 12.3  HCT 36.6  PLT 93*    Coag's Recent Labs  Lab 01/09/18 1151  INR 1.24    Sepsis Markers Recent Labs  Lab 01/09/18 1202  01/09/18 1329  LATICACIDVEN 3.05* 2.70*    ABG No results for input(s): PHART, PCO2ART, PO2ART in the last 168 hours.  Liver Enzymes Recent Labs  Lab 01/09/18 1151  AST 437*  ALT 464*  ALKPHOS 86  BILITOT 3.3*  ALBUMIN 2.9*    Cardiac Enzymes No results for input(s): TROPONINI, PROBNP in the last 168 hours.  Glucose No results for input(s): GLUCAP in the last 168 hours.  Imaging Ct Abdomen Pelvis Wo Contrast  Result Date: 01/09/2018 CLINICAL DATA:  62 year old female with a history of abdominal pain with nausea and vomiting EXAM: CT ABDOMEN AND PELVIS WITHOUT CONTRAST TECHNIQUE: Multidetector CT imaging of the abdomen and pelvis was performed following the standard protocol without IV contrast. COMPARISON:  09/24/2016 FINDINGS: Lower chest: No pleural effusion or confluent airspace disease. Hepatobiliary: Heterogeneously attenuating mass of the liver, prominently within segment 4, though extending into the right liver, segment 2 and segment 3. Estimated diameter on the axial images 9.5 cm x 8.7 cm. Additional nodule/mass within the periphery of the right liver on image 19. Gallbladder is relatively decompressed.  No radiopaque gallstones. Pancreas: Unremarkable appearance of pancreas Spleen: Unremarkable spleen Adrenals/Urinary Tract: Unremarkable adrenal glands. Bilateral kidneys without hydronephrosis or nephrolithiasis. Unremarkable course the bilateral ureters. Urinary bladder is relatively decompressed. Stomach/Bowel: Stomach unremarkable. No abnormally distended small bowel. No transition point. No focal inflammatory changes of the small bowel mesentery. Normal appendix. Colon is decompressed. Vascular/Lymphatic: No significant vascular calcifications. No mesenteric, retroperitoneal, or inguinal adenopathy. Reproductive: Fibroid changes of the uterus. Other: No large ventral wall hernia. Musculoskeletal: Grade 1 anterolisthesis of L4 on L5. Vacuum disc phenomenon at L4-L5 and L5-S1.  No bony canal narrowing. Degenerative changes of the facets. Mild degenerative changes of the hips. No acute fracture IMPRESSION: Heterogeneously attenuating mass within the liver, predominantly segment 4 measuring 9.5 cm by 8.7 cm. Differential includes abscess, primary liver tumor/biliary tumor, or metastases. Additional small pathologic focus within the lateral aspect of the right liver measuring approximately 2.6 cm. These results were called by telephone at the time of interpretation on 01/09/2018 at 2:25 pm to Dr. Lajean Saver , who verbally acknowledged these results. Fibroid changes of the uterus. Electronically Signed   By: Corrie Mckusick D.O.   On: 01/09/2018 14:28   Dg Chest 2 View  Result Date: 01/09/2018 CLINICAL DATA:  Fever, weakness and dizziness over the last 3 days. EXAM: CHEST - 2 VIEW COMPARISON:  09/23/2017 FINDINGS: Heart and mediastinal shadows are normal. Lung volumes are diminished which could be due to a poor inspiration for underlying lung disease. Lung markings appear more prominent diffusely, which again could relate to the poor inspiration or could indicate mild diffuse pneumonitis. No dense consolidation or lobar collapse. No effusions. No significant bone finding. IMPRESSION: Poor inspiration versus widespread pneumonitis. No dense consolidation or collapse. Electronically Signed   By: Nelson Chimes M.D.   On: 01/09/2018 11:29      DISCUSSION: The patient presents  with an amorphous large right liver density with fever.the differtial includes a hepatic abscess, liver tumor or metastatic disease. There was no gross evidence of disease elsewhere. No history of cancer and weight has been stable. The patient was hypotensive but her BP appears to be gradually creeping up. hads moderate elevation of transaminases and bili of 3. The patient has an element of AKI with a creatinine of  2.48. No history of kidney probems   ASSESSMENT / PLAN:  PULMONARY Respiratory status ap[pears  normal with normal woband o2 sats.  CARDIOVASCULAR Patiet in a nSR. Had hypotension which appears related to a septic syndrome. Has responded well to IV fluids. Not on pressors at this time.    RENAL  AKI related to poor intake and hypotension. Patient getting fluid rewsuscitated  GASTROINTESTINAL/Infx  ? liver abscess vs lhepatoma vs metastatic disease. Elevated LFTs Patient cultured and pllaced on abx with zoisyn, Vanco aND fLAGYL. wILL GET STOOL CULTURE AND STOOL FOR o AND p. I AM GOING TO ASKE THE SURGICAL SERVICE AND I SERVICE TO WEIGHT IN. The patien t may need an aspiration of the liver mass. C onsult is being placed to the gi service as well.   Family :: I have discussed the current issues and plan with the patient her husband and daughter      Micheal Likens MD Pulmonary and [place] Pager: [phone] Cell [phone]  01/09/2018, 3:33 PM

## 2018-01-09 NOTE — Progress Notes (Signed)
[facility] Progress Note Patient Name: Lisa Bauer DOB: 29-Jan-1956 MRN: [id number]   Date of Service  01/09/2018  HPI/Events of Note  Hypokalemia with partial K+ replacement in pt who is anuric.  eICU Interventions  Check K+ to determine need for additional replacement-result of BMET pending.     Intervention Category Minor Interventions: Electrolytes abnormality - evaluation and management  Jaliel Deavers U Rethel Sebek 01/09/2018, 9:05 PM

## 2018-01-09 NOTE — ED Notes (Signed)
PureWick placed on pt. 

## 2018-01-09 NOTE — ED Provider Notes (Addendum)
[place] EMERGENCY DEPARTMENT Provider Note   CSN: [id number] Arrival date & time: 01/09/18  1020     History   Chief Complaint Chief Complaint  Patient presents with  . Abdominal Pain    HPI KAMARIA LUCIA is a 62 y.o. female.  Patient c/o generally not feeling well in the past week. Felt achy, fatigued, less energy than normal, and fevers. In the past couple days, pain to upper abdomen, constant, dull, now severe, non radiating. Fevers persist. Was told possible uti and started on po antibiotic 2 days ago but symptoms worse. Nausea. A couple episodes emesis, not bloody or bilious.  Did have a few loose stools per day, no severe diarrhea. No prior abd surgery. No hx gallstones or kidney stones. No hx pancreatitis. Denies chest pain or sob. No cough or uri symptoms. No dysuria.   The history is provided by the patient and the spouse.  Abdominal Pain   Associated symptoms include fever, diarrhea, nausea, vomiting and myalgias. Pertinent negatives include headaches.    Past Medical History:  Diagnosis Date  . Hypertension     Patient Active Problem List   Diagnosis Date Noted  . Hepatic abscess 01/09/2018    History reviewed. No pertinent surgical history.   OB History   None      Home Medications    Prior to Admission medications   Medication Sig Start Date End Date Taking? Authorizing Provider  cholecalciferol (VITAMIN D) 1000 units tablet Take 1,000 Units by mouth daily.   Yes [provider]  fluconazole (DIFLUCAN) 150 MG tablet Take 150 mg by mouth See admin instructions. Take 1 tablet on 01/07/18. Repeat dose in 3 days (01/10/18) 01/07/18 01/11/18 Yes [provider]  lisinopril-hydrochlorothiazide (PRINZIDE,ZESTORETIC) 20-25 MG tablet Take 1 tablet by mouth at bedtime.  07/14/16  Yes [provider]  nitrofurantoin, macrocrystal-monohydrate, (MACROBID) 100 MG capsule Take 100 mg by mouth 2 (two) times daily. 01/07/18 01/12/18  Yes [provider]  ondansetron (ZOFRAN-ODT) 8 MG disintegrating tablet Take 8 mg by mouth every 8 (eight) hours as needed for nausea or vomiting.   Yes [provider]  phenazopyridine (PYRIDIUM) 200 MG tablet Take 200 mg by mouth 3 (three) times daily as needed for pain.   Yes [provider]  TURMERIC PO Take 2 capsules by mouth at bedtime.   Yes [provider]    Family History No family history on file.  Social History Social History   Tobacco Use  . Smoking status: Never Smoker  . Smokeless tobacco: Never Used  Substance Use Topics  . Alcohol use: No  . Drug use: No     Allergies   Patient has no known allergies.   Review of Systems Review of Systems  Constitutional: Positive for fever.  HENT: Negative for sore throat.   Eyes: Negative for redness.  Respiratory: Negative for cough and shortness of breath.   Cardiovascular: Negative for chest pain.  Gastrointestinal: Positive for abdominal pain, diarrhea, nausea and vomiting.  Endocrine: Negative for polyuria.  Genitourinary: Negative for flank pain.  Musculoskeletal: Positive for myalgias. Negative for back pain, neck pain and neck stiffness.  Skin: Negative for rash.  Neurological: Negative for headaches.  Hematological: Does not bruise/bleed easily.  Psychiatric/Behavioral: Negative for confusion.     Physical Exam Updated Vital Signs BP 90/68   Pulse 75   Temp 99.8 F (37.7 C) (Axillary)   Resp (!) 21   LMP  (LMP Unknown)  SpO2 97%   Physical Exam  Constitutional: She appears well-developed and well-nourished. She appears ill.  HENT:  Mouth/Throat: Oropharynx is clear and moist.  Eyes: Conjunctivae are normal. No scleral icterus.  Neck: Neck supple. No tracheal deviation present.  No stiffness or rigidity  Cardiovascular: Normal rate, regular rhythm, normal heart sounds and intact distal pulses. Exam reveals no gallop and no friction rub.  No murmur  heard. Pulmonary/Chest: Effort normal and breath sounds normal. No respiratory distress.  Abdominal: Soft. Normal appearance and bowel sounds are normal. She exhibits no distension and no mass. There is tenderness. There is guarding. There is no rebound. No hernia.  Moderate/severe upper abd tenderness.   Genitourinary:  Genitourinary Comments: No cva tenderness  Musculoskeletal: She exhibits no edema or tenderness.  Neurological: She is alert.  Skin: Skin is warm and dry. No rash noted.  Psychiatric: She has a normal mood and affect.  Nursing note and vitals reviewed.    ED Treatments / Results  Labs (all labs ordered are listed, but only abnormal results are displayed) Results for orders placed or performed during the hospital encounter of 01/09/18  Comprehensive metabolic panel  Result Value Ref Range   Sodium 127 (L) 135 - 145 mmol/L   Potassium 2.8 (L) 3.5 - 5.1 mmol/L   Chloride 87 (L) 98 - 111 mmol/L   CO2 25 22 - 32 mmol/L   Glucose, Bld 116 (H) 70 - 99 mg/dL   BUN 38 (H) 8 - 23 mg/dL   Creatinine, Ser 2.48 (H) 0.44 - 1.00 mg/dL   Calcium 7.6 (L) 8.9 - 10.3 mg/dL   Total Protein 6.1 (L) 6.5 - 8.1 g/dL   Albumin 2.9 (L) 3.5 - 5.0 g/dL   AST 437 (H) 15 - 41 U/L   ALT 464 (H) 0 - 44 U/L   Alkaline Phosphatase 86 38 - 126 U/L   Total Bilirubin 3.3 (H) 0.3 - 1.2 mg/dL   GFR calc non Af Amer 20 (L) >60 mL/min   GFR calc Af Amer 23 (L) >60 mL/min   Anion gap 15 5 - 15  CBC with Differential  Result Value Ref Range   WBC 9.9 4.0 - 10.5 K/uL   RBC 4.18 3.87 - 5.11 MIL/uL   Hemoglobin 12.3 12.0 - 15.0 g/dL   HCT 36.6 36.0 - 46.0 %   MCV 87.6 78.0 - 100.0 fL   MCH 29.4 26.0 - 34.0 pg   MCHC 33.6 30.0 - 36.0 g/dL   RDW 13.7 11.5 - 15.5 %   Platelets 93 (L) 150 - 400 K/uL   Neutrophils Relative % 58 %   Lymphocytes Relative 9 %   Monocytes Relative 6 %   Eosinophils Relative 0 %   Basophils Relative 0 %   Band Neutrophils 24 %   Metamyelocytes Relative 2 %    Myelocytes 1 %   Promyelocytes Relative 0 %   Blasts 0 %   nRBC 0 0 /100 WBC   Other 0 %   Neutro Abs 8.4 (H) 1.7 - 7.7 K/uL   Lymphs Abs 0.9 0.7 - 4.0 K/uL   Monocytes Absolute 0.6 0.1 - 1.0 K/uL   Eosinophils Absolute 0.0 0.0 - 0.7 K/uL   Basophils Absolute 0.0 0.0 - 0.1 K/uL   WBC Morphology INCREASED BANDS (>20% BANDS)    Smear Review PLATELETS APPEAR DECREASED   Protime-INR  Result Value Ref Range   Prothrombin Time 15.5 (H) 11.4 - 15.2 seconds   INR 1.24  Lipase, blood  Result Value Ref Range   Lipase 24 11 - 51 U/L  Magnesium  Result Value Ref Range   Magnesium 1.6 (L) 1.7 - 2.4 mg/dL  I-Stat CG4 Lactic Acid, ED  Result Value Ref Range   Lactic Acid, Venous 3.05 (HH) 0.5 - 1.9 mmol/L   Comment NOTIFIED PHYSICIAN   I-Stat CG4 Lactic Acid, ED  (not at  [facility])  Result Value Ref Range   Lactic Acid, Venous 2.70 (HH) 0.5 - 1.9 mmol/L   Comment NOTIFIED PHYSICIAN    Ct Abdomen Pelvis Wo Contrast  Result Date: 01/09/2018 CLINICAL DATA:  62 year old female with a history of abdominal pain with nausea and vomiting EXAM: CT ABDOMEN AND PELVIS WITHOUT CONTRAST TECHNIQUE: Multidetector CT imaging of the abdomen and pelvis was performed following the standard protocol without IV contrast. COMPARISON:  09/24/2016 FINDINGS: Lower chest: No pleural effusion or confluent airspace disease. Hepatobiliary: Heterogeneously attenuating mass of the liver, prominently within segment 4, though extending into the right liver, segment 2 and segment 3. Estimated diameter on the axial images 9.5 cm x 8.7 cm. Additional nodule/mass within the periphery of the right liver on image 19. Gallbladder is relatively decompressed.  No radiopaque gallstones. Pancreas: Unremarkable appearance of pancreas Spleen: Unremarkable spleen Adrenals/Urinary Tract: Unremarkable adrenal glands. Bilateral kidneys without hydronephrosis or nephrolithiasis. Unremarkable course the bilateral ureters. Urinary bladder is relatively  decompressed. Stomach/Bowel: Stomach unremarkable. No abnormally distended small bowel. No transition point. No focal inflammatory changes of the small bowel mesentery. Normal appendix. Colon is decompressed. Vascular/Lymphatic: No significant vascular calcifications. No mesenteric, retroperitoneal, or inguinal adenopathy. Reproductive: Fibroid changes of the uterus. Other: No large ventral wall hernia. Musculoskeletal: Grade 1 anterolisthesis of L4 on L5. Vacuum disc phenomenon at L4-L5 and L5-S1. No bony canal narrowing. Degenerative changes of the facets. Mild degenerative changes of the hips. No acute fracture IMPRESSION: Heterogeneously attenuating mass within the liver, predominantly segment 4 measuring 9.5 cm by 8.7 cm. Differential includes abscess, primary liver tumor/biliary tumor, or metastases. Additional small pathologic focus within the lateral aspect of the right liver measuring approximately 2.6 cm. These results were called by telephone at the time of interpretation on 01/09/2018 at 2:25 pm to Dr. Lajean Saver , who verbally acknowledged these results. Fibroid changes of the uterus. Electronically Signed   By: Corrie Mckusick D.O.   On: 01/09/2018 14:28   Dg Chest 2 View  Result Date: 01/09/2018 CLINICAL DATA:  Fever, weakness and dizziness over the last 3 days. EXAM: CHEST - 2 VIEW COMPARISON:  09/23/2017 FINDINGS: Heart and mediastinal shadows are normal. Lung volumes are diminished which could be due to a poor inspiration for underlying lung disease. Lung markings appear more prominent diffusely, which again could relate to the poor inspiration or could indicate mild diffuse pneumonitis. No dense consolidation or lobar collapse. No effusions. No significant bone finding. IMPRESSION: Poor inspiration versus widespread pneumonitis. No dense consolidation or collapse. Electronically Signed   By: Nelson Chimes M.D.   On: 01/09/2018 11:29    EKG EKG Interpretation  Date/Time:  Wednesday January 09 2018 11:57:43 EDT Ventricular Rate:  79 PR Interval:    QRS Duration: 124 QT Interval:  391 QTC Calculation: 449 R Axis:   -4 Text Interpretation:  Sinus rhythm IVCD Left ventricular hypertrophy Nonspecific ST abnormality No significant change since last tracing Confirmed by Lajean Saver [id number]) on 01/09/2018 12:00:48 PM   Radiology Ct Abdomen Pelvis Wo Contrast  Result Date: 01/09/2018 CLINICAL DATA:  62 year old female  with a history of abdominal pain with nausea and vomiting EXAM: CT ABDOMEN AND PELVIS WITHOUT CONTRAST TECHNIQUE: Multidetector CT imaging of the abdomen and pelvis was performed following the standard protocol without IV contrast. COMPARISON:  09/24/2016 FINDINGS: Lower chest: No pleural effusion or confluent airspace disease. Hepatobiliary: Heterogeneously attenuating mass of the liver, prominently within segment 4, though extending into the right liver, segment 2 and segment 3. Estimated diameter on the axial images 9.5 cm x 8.7 cm. Additional nodule/mass within the periphery of the right liver on image 19. Gallbladder is relatively decompressed.  No radiopaque gallstones. Pancreas: Unremarkable appearance of pancreas Spleen: Unremarkable spleen Adrenals/Urinary Tract: Unremarkable adrenal glands. Bilateral kidneys without hydronephrosis or nephrolithiasis. Unremarkable course the bilateral ureters. Urinary bladder is relatively decompressed. Stomach/Bowel: Stomach unremarkable. No abnormally distended small bowel. No transition point. No focal inflammatory changes of the small bowel mesentery. Normal appendix. Colon is decompressed. Vascular/Lymphatic: No significant vascular calcifications. No mesenteric, retroperitoneal, or inguinal adenopathy. Reproductive: Fibroid changes of the uterus. Other: No large ventral wall hernia. Musculoskeletal: Grade 1 anterolisthesis of L4 on L5. Vacuum disc phenomenon at L4-L5 and L5-S1. No bony canal narrowing. Degenerative changes of the facets.  Mild degenerative changes of the hips. No acute fracture IMPRESSION: Heterogeneously attenuating mass within the liver, predominantly segment 4 measuring 9.5 cm by 8.7 cm. Differential includes abscess, primary liver tumor/biliary tumor, or metastases. Additional small pathologic focus within the lateral aspect of the right liver measuring approximately 2.6 cm. These results were called by telephone at the time of interpretation on 01/09/2018 at 2:25 pm to Dr. Lajean Saver , who verbally acknowledged these results. Fibroid changes of the uterus. Electronically Signed   By: Corrie Mckusick D.O.   On: 01/09/2018 14:28   Dg Chest 2 View  Result Date: 01/09/2018 CLINICAL DATA:  Fever, weakness and dizziness over the last 3 days. EXAM: CHEST - 2 VIEW COMPARISON:  09/23/2017 FINDINGS: Heart and mediastinal shadows are normal. Lung volumes are diminished which could be due to a poor inspiration for underlying lung disease. Lung markings appear more prominent diffusely, which again could relate to the poor inspiration or could indicate mild diffuse pneumonitis. No dense consolidation or lobar collapse. No effusions. No significant bone finding. IMPRESSION: Poor inspiration versus widespread pneumonitis. No dense consolidation or collapse. Electronically Signed   By: Nelson Chimes M.D.   On: 01/09/2018 11:29    Procedures Procedures (including critical care time)  Medications Ordered in ED Medications  0.9 %  sodium chloride infusion (has no administration in time range)  enoxaparin (LOVENOX) injection 30 mg (has no administration in time range)  famotidine (PEPCID) IVPB 20 mg premix (has no administration in time range)  ondansetron (ZOFRAN) injection 4 mg (has no administration in time range)  potassium chloride 10 mEq in 100 mL IVPB (10 mEq Intravenous New Bag/Given 01/09/18 1538)  lactated ringers with KCl 20 mEq/L infusion (has no administration in time range)  HYDROmorphone (DILAUDID) injection 1 mg (has no  administration in time range)  sodium chloride 0.9 % bolus 1,000 mL (0 mLs Intravenous Stopped 01/09/18 1420)  HYDROmorphone (DILAUDID) injection 0.5 mg (0.5 mg Intravenous Given 01/09/18 1148)  ondansetron (ZOFRAN) injection 4 mg (4 mg Intravenous Given 01/09/18 1147)  sodium chloride 0.9 % bolus 1,000 mL (0 mLs Intravenous Stopped 01/09/18 1409)    And  sodium chloride 0.9 % bolus 1,000 mL (0 mLs Intravenous Stopped 01/09/18 1409)    And  sodium chloride 0.9 % bolus 500 mL (0 mLs  Intravenous Stopped 01/09/18 1409)  piperacillin-tazobactam (ZOSYN) IVPB 3.375 g (0 g Intravenous Stopped 01/09/18 1226)  vancomycin (VANCOCIN) 1,500 mg in sodium chloride 0.9 % 500 mL IVPB (0 mg Intravenous Stopped 01/09/18 1508)  sodium chloride 0.9 % bolus 1,000 mL (1,000 mLs Intravenous New Bag/Given 01/09/18 1421)  metroNIDAZOLE (FLAGYL) IVPB 500 mg (0 mg Intravenous Stopped 01/09/18 1526)     Initial Impression / Assessment and Plan / ED Course  I have reviewed the triage vital signs and the nursing notes.  Pertinent labs & imaging results that were available during my care of the patient were reviewed by me and considered in my medical decision making (see chart for details).  Iv ns bolus. Stat labs. Stat ct.   Reviewed nursing notes and prior charts for additional history.   Code sepsis activated. Cultures. Iv antibiotics, vanc and zosyn.  30 cc/kg ns bolus.   2nd iv line. Continued monitoring.   Pt initially normotensive, but became hypotensive. Pt did have strong bil radial pulses all along, even when bp reading low, and was conscious and alert. When bp low, additional ns bolus. bp responded, and on rechecks in room would improved > 90/50.   Labs reviewed - initial lactate very high. Additional fluids.   Recheck abd, persistent upper abd tenderness. bp 96/50. Pt requests pain medication. Dilaudid .5 mg iv for pain. zofran for nausea.  Await CT.  bp transiently low, < 90/ again. Additional fluids. Recheck, bp  improved.   k low, kcl iv. Mg added to labs.   Ct reviewed - felt c/w large liver abscess +suspected that is source of infection vs seeding  From elsewhere. Flagyl iv added to earlier antibiotics.   ICU service consulted-  Will admit, care turned over to ICU team who was already present in ED. Gen surgery consulted - they indicate IR for drainage, and that no role for gen surg at this point.  IR consulted - discussed pt with Dr Anselm Pancoast - he will arrange imaging guiding drain.   Post 30 cc/kg boluses, lactate improved. bp 94/52.   Pt/family updated. Critical care team w pt.  Repeat bp 98/67.  CRITICAL CARE   RE: severe sepsis, acute kidney injury. Liver abscess, hypokalemia, dehydration, hypotension  Performed by: Mirna Mires Total critical care time: 95 minutes Critical care time was exclusive of separately billable procedures and treating other patients. Critical care was necessary to treat or prevent imminent or life-threatening deterioration. Critical care was time spent personally by me on the following activities: development of treatment plan with patient and/or surrogate as well as nursing, discussions with consultants, evaluation of patient's response to treatment, examination of patient, obtaining history from patient or surrogate, ordering and performing treatments and interventions, ordering and review of laboratory studies, ordering and review of radiographic studies, pulse oximetry and re-evaluation of patient's condition.     Final Clinical Impressions(s) / ED Diagnoses   Final diagnoses:  Fever    ED Discharge Orders    None           Lajean Saver, MD 01/12/18 0730

## 2018-01-09 NOTE — ED Notes (Signed)
Bladder scan 96 mL 

## 2018-01-09 NOTE — ED Triage Notes (Signed)
Pt arrives to the ED with mid abd pain with n/v for the last 3 days. 3 episodes of v/d in the last 24 hours. Pt was diagnosed with UTI and started anabiotics. Pt arrives today and feels dehydrated , hasn't eaten solid  food in 3 days.

## 2018-01-09 NOTE — Progress Notes (Signed)
Pharmacy Antibiotic Note  Lisa Bauer is a 62 y.o. female admitted on 01/09/2018 with intra-abdominal infection.  Pharmacy has been consulted for Vancomycin dosing.  Plan: Patient received Vancomycin 1500 mg IV x 1 in the ED Continue Vancomycin 1 gram q24hr Monitor renal function, cultures and LOT   Height: 6' (182.9 cm) Weight: 186 lb 1.1 oz (84.4 kg) IBW/kg (Calculated) : 73.1  Temp (24hrs), Avg:100.5 F (38.1 C), Min:99.8 F (37.7 C), Max:100.8 F (38.2 C)  Recent Labs  Lab 01/09/18 1151 01/09/18 1202 01/09/18 1329 01/09/18 1655  WBC 9.9  --   --  12.0*  CREATININE 2.48*  --   --  2.32*  LATICACIDVEN  --  3.05* 2.70*  --     Estimated Creatinine Clearance: 29 mL/min (A) (by C-G formula based on SCr of 2.32 mg/dL (H)).    No Known Allergies  Antimicrobials this admission: Vanc 8/7>> Zosyn 8/7>> Flagyl 8/7>>  Microbiology results: 8/7 BCx: pending 8/7 MRSA PCR: negative  Thank you for allowing pharmacy to be a part of this patient's care.  Alanda Slim, PharmD, [facility] Clinical Pharmacist Please see AMION for all Pharmacists' Contact Phone Numbers 01/09/2018, 9:00 PM

## 2018-01-09 NOTE — Progress Notes (Signed)
Patient ID: Lisa Bauer, female   DOB: 1956/02/29, 62 y.o.   MRN: [id number] IR was consulted about a hepatic mass vs abscess.  Reviewed recent CT and discussed with Dr. Debbora Dus to confirm that patient does not need emergent percutaneous sampling or drainage.  Favor a neoplastic process based on CT and normal WBC but a large abscess cannot be excluded.  Dr. Debbora Dus did not think the patient needed emergent percutaneous sampling and we discussed getting additional imaging.  Limited options with patient's kidney function but a non contrast MRI of the abdomen would be very useful to assess the liver disease.  Tentatively, patient will get MRI tomorrow if she remains stable and clinically improves.  Otherwise, US guided biopsy or aspiration/drainage is possible.  Plan on formal consult tomorrow.

## 2018-01-10 DIAGNOSIS — R6521 Severe sepsis with septic shock: Secondary | ICD-10-CM

## 2018-01-10 DIAGNOSIS — A419 Sepsis, unspecified organism: Secondary | ICD-10-CM

## 2018-01-10 DIAGNOSIS — N179 Acute kidney failure, unspecified: Secondary | ICD-10-CM

## 2018-01-10 LAB — BLOOD CULTURE ID PANEL (REFLEXED)
Acinetobacter baumannii: NOT DETECTED
CANDIDA PARAPSILOSIS: NOT DETECTED
CANDIDA TROPICALIS: NOT DETECTED
Candida albicans: NOT DETECTED
Candida glabrata: NOT DETECTED
Candida krusei: NOT DETECTED
ENTEROBACTERIACEAE SPECIES: NOT DETECTED
Enterobacter cloacae complex: NOT DETECTED
Enterococcus species: NOT DETECTED
Escherichia coli: NOT DETECTED
HAEMOPHILUS INFLUENZAE: NOT DETECTED
KLEBSIELLA PNEUMONIAE: NOT DETECTED
Klebsiella oxytoca: NOT DETECTED
Listeria monocytogenes: NOT DETECTED
Neisseria meningitidis: NOT DETECTED
PROTEUS SPECIES: NOT DETECTED
Pseudomonas aeruginosa: NOT DETECTED
SERRATIA MARCESCENS: NOT DETECTED
STAPHYLOCOCCUS AUREUS BCID: NOT DETECTED
STAPHYLOCOCCUS SPECIES: NOT DETECTED
STREPTOCOCCUS SPECIES: DETECTED — AB
Streptococcus agalactiae: NOT DETECTED
Streptococcus pneumoniae: NOT DETECTED
Streptococcus pyogenes: NOT DETECTED

## 2018-01-10 LAB — TYPE AND SCREEN
ABO/RH(D): A NEG
Antibody Screen: NEGATIVE

## 2018-01-10 LAB — MAGNESIUM: MAGNESIUM: 1.6 mg/dL — AB (ref 1.7–2.4)

## 2018-01-10 LAB — BASIC METABOLIC PANEL
Anion gap: 14 (ref 5–15)
BUN: 45 mg/dL — AB (ref 8–23)
CALCIUM: 6.8 mg/dL — AB (ref 8.9–10.3)
CO2: 21 mmol/L — ABNORMAL LOW (ref 22–32)
Chloride: 99 mmol/L (ref 98–111)
Creatinine, Ser: 3.01 mg/dL — ABNORMAL HIGH (ref 0.44–1.00)
GFR calc non Af Amer: 16 mL/min — ABNORMAL LOW (ref >60)
GFR, EST AFRICAN AMERICAN: 18 mL/min — AB (ref >60)
Glucose, Bld: 86 mg/dL (ref 70–99)
Potassium: 2.8 mmol/L — ABNORMAL LOW (ref 3.5–5.1)
SODIUM: 134 mmol/L — AB (ref 135–145)

## 2018-01-10 LAB — HIV ANTIBODY (ROUTINE TESTING W REFLEX): HIV Screen 4th Generation wRfx: NONREACTIVE

## 2018-01-10 LAB — GLUCOSE, CAPILLARY
GLUCOSE-CAPILLARY: 125 mg/dL — AB (ref 70–99)
Glucose-Capillary: 95 mg/dL (ref 70–99)

## 2018-01-10 LAB — ABO/RH: ABO/RH(D): A NEG

## 2018-01-10 LAB — PHOSPHORUS: PHOSPHORUS: 2.4 mg/dL — AB (ref 2.5–4.6)

## 2018-01-10 LAB — LACTIC ACID, PLASMA: Lactic Acid, Venous: 2 mmol/L (ref 0.5–1.9)

## 2018-01-10 MED ORDER — FENTANYL CITRATE (PF) 100 MCG/2ML IJ SOLN
25.0000 ug | INTRAMUSCULAR | Status: DC | PRN
  Administered 2018-01-10: 75 ug via INTRAVENOUS
  Administered 2018-01-10: 100 ug via INTRAVENOUS
  Administered 2018-01-11: 25 ug via INTRAVENOUS
  Administered 2018-01-11: 100 ug via INTRAVENOUS
  Administered 2018-01-11: 50 ug via INTRAVENOUS
  Administered 2018-01-11: 75 ug via INTRAVENOUS
  Administered 2018-01-12 (×2): 25 ug via INTRAVENOUS
  Filled 2018-01-10 (×8): qty 2

## 2018-01-10 MED ORDER — ALBUMIN HUMAN 5 % IV SOLN
INTRAVENOUS | Status: AC
  Administered 2018-01-10: 25 g via INTRAVENOUS
  Filled 2018-01-10: qty 500

## 2018-01-10 MED ORDER — POTASSIUM CHLORIDE 10 MEQ/100ML IV SOLN
10.0000 meq | INTRAVENOUS | Status: AC
  Administered 2018-01-10 (×4): 10 meq via INTRAVENOUS
  Filled 2018-01-10 (×3): qty 100

## 2018-01-10 MED ORDER — MAGNESIUM SULFATE 2 GM/50ML IV SOLN: 2.0000 g | Freq: Once | INTRAVENOUS | Status: DC

## 2018-01-10 MED ORDER — DEXTROSE 5 % IV SOLN
30.0000 mmol | Freq: Once | INTRAVENOUS | Status: AC
  Administered 2018-01-10: 30 mmol via INTRAVENOUS
  Filled 2018-01-10: qty 10

## 2018-01-10 MED ORDER — PHENYLEPHRINE HCL-NACL 10-0.9 MG/250ML-% IV SOLN
0.0000 ug/min | INTRAVENOUS | Status: DC
  Administered 2018-01-10: 20 ug/min via INTRAVENOUS
  Filled 2018-01-10: qty 250

## 2018-01-10 MED ORDER — MAGNESIUM SULFATE 2 GM/50ML IV SOLN
2.0000 g | Freq: Once | INTRAVENOUS | Status: AC
  Administered 2018-01-10: 2 g via INTRAVENOUS
  Filled 2018-01-10: qty 50

## 2018-01-10 MED ORDER — SODIUM CHLORIDE 0.9 % IV SOLN
INTRAVENOUS | Status: DC
  Administered 2018-01-10 – 2018-01-13 (×8): via INTRAVENOUS

## 2018-01-10 MED ORDER — HYDROCORTISONE NA SUCCINATE PF 100 MG IJ SOLR
100.0000 mg | Freq: Four times a day (QID) | INTRAMUSCULAR | Status: DC
  Administered 2018-01-10 (×2): 100 mg via INTRAVENOUS
  Filled 2018-01-10 (×2): qty 2

## 2018-01-10 MED ORDER — SODIUM CHLORIDE 0.9% IV SOLUTION: Freq: Once | INTRAVENOUS | Status: DC

## 2018-01-10 MED ORDER — ALBUMIN HUMAN 5 % IV SOLN
25.0000 g | Freq: Once | INTRAVENOUS | Status: AC
  Administered 2018-01-10: 25 g via INTRAVENOUS

## 2018-01-10 NOTE — Progress Notes (Signed)
eLink Physician-Brief Progress Note Patient Name: Lisa Bauer DOB: [birth year] MRN: [id number]   Date of Service  01/10/2018  HPI/Events of Note  KCL 2.8 meq/ l Lactic acid 2.0  eICU Interventions  KCL 10 meq iv Q 1 hr x 4 doses Trend serum Lactate        Aniko Finnigan U Roey Coopman 01/10/2018, 4:00 AM

## 2018-01-10 NOTE — Progress Notes (Signed)
PHARMACY - PHYSICIAN COMMUNICATION CRITICAL VALUE ALERT - BLOOD CULTURE IDENTIFICATION (BCID)  Lisa Bauer is an 62 y.o. female who presented to [facility] on 01/09/2018 with an amorphous large right liver density with fever.the differtial includes a hepatic abscess, liver tumor or metastatic disease  Assessment:  Possible intra-abdominal infection with 1 out of 4 blood cultures positive for GPCs in chains identified as Strep  Name of physician (or Provider) Contacted: Dr. Elsworth Soho  Current antibiotics: Zosyn  Changes to prescribed antibiotics recommended:  Patient is on recommended antibiotics - No changes needed  Results for orders placed or performed during the hospital encounter of 01/09/18  Blood Culture ID Panel (Reflexed) (Collected: 01/09/2018 11:40 AM)  Result Value Ref Range   Enterococcus species NOT DETECTED NOT DETECTED   Listeria monocytogenes NOT DETECTED NOT DETECTED   Staphylococcus species NOT DETECTED NOT DETECTED   Staphylococcus aureus NOT DETECTED NOT DETECTED   Streptococcus species DETECTED (A) NOT DETECTED   Streptococcus agalactiae NOT DETECTED NOT DETECTED   Streptococcus pneumoniae NOT DETECTED NOT DETECTED   Streptococcus pyogenes NOT DETECTED NOT DETECTED   Acinetobacter baumannii NOT DETECTED NOT DETECTED   Enterobacteriaceae species NOT DETECTED NOT DETECTED   Enterobacter cloacae complex NOT DETECTED NOT DETECTED   Escherichia coli NOT DETECTED NOT DETECTED   Klebsiella oxytoca NOT DETECTED NOT DETECTED   Klebsiella pneumoniae NOT DETECTED NOT DETECTED   Proteus species NOT DETECTED NOT DETECTED   Serratia marcescens NOT DETECTED NOT DETECTED   Haemophilus influenzae NOT DETECTED NOT DETECTED   Neisseria meningitidis NOT DETECTED NOT DETECTED   Pseudomonas aeruginosa NOT DETECTED NOT DETECTED   Candida albicans NOT DETECTED NOT DETECTED   Candida glabrata NOT DETECTED NOT DETECTED   Candida krusei NOT DETECTED NOT DETECTED   Candida parapsilosis NOT  DETECTED NOT DETECTED   Candida tropicalis NOT DETECTED NOT DETECTED    Alanda Slim, PharmD, [facility] Clinical Pharmacist Please see AMION for all Pharmacists' Contact Phone Numbers 01/10/2018, 4:31 PM

## 2018-01-10 NOTE — Progress Notes (Signed)
Chief Complaint: Patient was seen in consultation today for liver mass biopsy/drainage at the request of Montey Hora PA-C  Referring Physician(s): Montey Hora PA-C  Supervising Physician: Arne Cleveland  Patient Status: [facility] - In-pt  History of Present Illness: Lisa Bauer is a 62 y.o. female who presented with fever, hypotension, lactate of 3.0 and abdominal pain.  CT abdomen showing a large liver mass. She required transient Neo-Synephrine and has been tapered off now. She is also now afebrile. Her workup has found a large hepatic mass vs abscess. Further MRI was performed today and again favors neoplastic process, though there does appear to be some fluid component to it. IR is asked to perform bx vs drainage of this lesion. Imaging reviewed by Dr. Vernard Gambles. Pt seen resting in bed comfortably, husband at bedside. Chart, meds, labs also reviewed.  Past Medical History:  Diagnosis Date  . Hypertension     History reviewed. No pertinent surgical history.  Allergies: Patient has no known allergies.  Medications:  Current Facility-Administered Medications:  .  0.9 %  sodium chloride infusion, 250 mL, Intravenous, PRN, Tarry Kos, MD .  0.9 %  sodium chloride infusion, , Intravenous, Continuous, Desai, Rahul P, PA-C, Last Rate: 100 mL/hr at 01/10/18 1052 .  fentaNYL (SUBLIMAZE) injection 25-100 mcg, 25-100 mcg, Intravenous, Q2H PRN, Desai, Rahul P, PA-C .  magnesium sulfate IVPB 2 g 50 mL, 2 g, Intravenous, Once, Tarry Kos, MD .  ondansetron [facility]) injection 4 mg, 4 mg, Intravenous, Q6H PRN, Tarry Kos, MD, 4 mg at 01/09/18 1917 .  piperacillin-tazobactam (ZOSYN) IVPB 3.375 g, 3.375 g, Intravenous, Q8H, Corinda Gubler, RPH, Last Rate: 12.5 mL/hr at 01/10/18 0531, 3.375 g at 01/10/18 0531 .  potassium PHOSPHATE 30 mmol in dextrose 5 % 500 mL infusion, 30 mmol, Intravenous, Once, Desai, Rahul P, PA-C    No family history on  file.  Social History   Socioeconomic History  . Marital status: Married    Spouse name: Not on file  . Number of children: Not on file  . Years of education: Not on file  . Highest education level: Not on file  Occupational History  . Not on file  Social Needs  . Financial resource strain: Not on file  . Food insecurity:    Worry: Not on file    Inability: Not on file  . Transportation needs:    Medical: Not on file    Non-medical: Not on file  Tobacco Use  . Smoking status: Never Smoker  . Smokeless tobacco: Never Used  Substance and Sexual Activity  . Alcohol use: No  . Drug use: No  . Sexual activity: Not on file  Lifestyle  . Physical activity:    Days per week: Not on file    Minutes per session: Not on file  . Stress: Not on file  Relationships  . Social connections:    Talks on phone: Not on file    Gets together: Not on file    Attends religious service: Not on file    Active member of club or organization: Not on file    Attends meetings of clubs or organizations: Not on file    Relationship status: Not on file  Other Topics Concern  . Not on file  Social History Narrative  . Not on file    Review of Systems: A 12 point ROS discussed and pertinent positives are indicated in the HPI above.  All other systems  are negative.  Review of Systems  Vital Signs: BP 115/86   Pulse 69   Temp 97.9 F (36.6 C) (Oral)   Resp (!) 22   Ht 6' (1.829 m)   Wt 84.4 kg   LMP  (LMP Unknown)   SpO2 100%   BMI 25.24 kg/m   Physical Exam  Constitutional: She is oriented to person, place, and time. She appears well-developed and well-nourished. No distress.  HENT:  Head: Normocephalic.  Mouth/Throat: Oropharynx is clear and moist.  Cardiovascular: Normal rate, regular rhythm and normal heart sounds.  Pulmonary/Chest: Effort normal and breath sounds normal. No respiratory distress.  Abdominal: Soft. She exhibits no mass. There is no tenderness. There is no  guarding.  Neurological: She is alert and oriented to person, place, and time.  Skin: Skin is warm and dry. She is not diaphoretic.  Psychiatric: She has a normal mood and affect.    Imaging: Ct Abdomen Pelvis Wo Contrast  Result Date: 01/09/2018 CLINICAL DATA:  62 year old female with a history of abdominal pain with nausea and vomiting EXAM: CT ABDOMEN AND PELVIS WITHOUT CONTRAST TECHNIQUE: Multidetector CT imaging of the abdomen and pelvis was performed following the standard protocol without IV contrast. COMPARISON:  09/24/2016 FINDINGS: Lower chest: No pleural effusion or confluent airspace disease. Hepatobiliary: Heterogeneously attenuating mass of the liver, prominently within segment 4, though extending into the right liver, segment 2 and segment 3. Estimated diameter on the axial images 9.5 cm x 8.7 cm. Additional nodule/mass within the periphery of the right liver on image 19. Gallbladder is relatively decompressed.  No radiopaque gallstones. Pancreas: Unremarkable appearance of pancreas Spleen: Unremarkable spleen Adrenals/Urinary Tract: Unremarkable adrenal glands. Bilateral kidneys without hydronephrosis or nephrolithiasis. Unremarkable course the bilateral ureters. Urinary bladder is relatively decompressed. Stomach/Bowel: Stomach unremarkable. No abnormally distended small bowel. No transition point. No focal inflammatory changes of the small bowel mesentery. Normal appendix. Colon is decompressed. Vascular/Lymphatic: No significant vascular calcifications. No mesenteric, retroperitoneal, or inguinal adenopathy. Reproductive: Fibroid changes of the uterus. Other: No large ventral wall hernia. Musculoskeletal: Grade 1 anterolisthesis of L4 on L5. Vacuum disc phenomenon at L4-L5 and L5-S1. No bony canal narrowing. Degenerative changes of the facets. Mild degenerative changes of the hips. No acute fracture IMPRESSION: Heterogeneously attenuating mass within the liver, predominantly segment 4  measuring 9.5 cm by 8.7 cm. Differential includes abscess, primary liver tumor/biliary tumor, or metastases. Additional small pathologic focus within the lateral aspect of the right liver measuring approximately 2.6 cm. These results were called by telephone at the time of interpretation on 01/09/2018 at 2:25 pm to Dr. Lajean Saver , who verbally acknowledged these results. Fibroid changes of the uterus. Electronically Signed   By: Corrie Mckusick D.O.   On: 01/09/2018 14:28   Dg Chest 2 View  Result Date: 01/09/2018 CLINICAL DATA:  Fever, weakness and dizziness over the last 3 days. EXAM: CHEST - 2 VIEW COMPARISON:  09/23/2017 FINDINGS: Heart and mediastinal shadows are normal. Lung volumes are diminished which could be due to a poor inspiration for underlying lung disease. Lung markings appear more prominent diffusely, which again could relate to the poor inspiration or could indicate mild diffuse pneumonitis. No dense consolidation or lobar collapse. No effusions. No significant bone finding. IMPRESSION: Poor inspiration versus widespread pneumonitis. No dense consolidation or collapse. Electronically Signed   By: Nelson Chimes M.D.   On: 01/09/2018 11:29   Mr Abdomen Mrcp Wo Contrast  Result Date: 01/10/2018 CLINICAL DATA:  Abdominal pain. Extrahepatic cholangiocarcinoma  suspected. EXAM: MRI ABDOMEN WITHOUT CONTRAST  (INCLUDING MRCP) TECHNIQUE: Multiplanar multisequence MR imaging of the abdomen was performed. Heavily T2-weighted images of the biliary and pancreatic ducts were obtained, and three-dimensional MRCP images were rendered by post processing. COMPARISON:  01/09/2018 FINDINGS: Lower chest: Small bilateral pleural effusions identified. Hepatobiliary: There is a large heterogeneous mass involving much of the entirety of segment 4. The mass measures 10.2 by 10.5 by 9.4 cm, image 14/6. A second smaller lesion is identified within the periphery of segment 7 measuring 2.7 cm, image 29/2. The common bile  duct has a normal caliber. Pericholecystic fluid is identified. No gallstones identified. Pancreas: Within the limitations of unenhanced technique no pancreas abnormality identified. No main duct dilatation or mass. Spleen:  Normal appearance of the spleen. Adrenals/Urinary Tract: Normal appearance of the adrenal glands. No kidney mass or hydronephrosis identified. Stomach/Bowel: The stomach is normal. The visualized bowel loops within the upper abdomen are unremarkable. Vascular/Lymphatic: Normal appearance of the abdominal aorta. No retroperitoneal adenopathy. Other: There is a small amount of free fluid identified within the upper abdomen. Musculoskeletal: No suspicious bone lesions identified. IMPRESSION: 1. Examination confirms presence of large heterogeneous mass involving almost the entire segment 4 of the liver. A smaller lesion is identified within the lateral aspect of segment 7. Imaging findings are compatible with either primary neoplasm of the liver such as cholangiocarcinoma or metastatic disease. The dominant mass should be easily amendable to image guided percutaneous biopsy. Additionally, consider further investigation with PET-CT to assess for distant metastatic disease. 2. Small pleural effusions. Electronically Signed   By: Kerby Moors M.D.   On: 01/10/2018 10:25   Mr 3d Recon At Scanner  Result Date: 01/10/2018 CLINICAL DATA:  Abdominal pain. 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 EXAM: MRI ABDOMEN WITHOUT CONTRAST  (INCLUDING MRCP) TECHNIQUE: Multiplanar multisequence MR imaging of the abdomen was performed. Heavily T2-weighted images of the biliary and pancreatic ducts were obtained, and three-dimensional MRCP images were rendered by post processing. COMPARISON:  01/09/2018 FINDINGS: Lower chest: Small bilateral pleural effusions identified. Hepatobiliary: There is a large heterogeneous mass involving much of the entirety of segment 4. The mass measures 10.2 by 10.5 by 9.4 cm, image  14/6. A second smaller lesion is identified within the periphery of segment 7 measuring 2.7 cm, image 29/2. The common bile duct has a normal caliber. Pericholecystic fluid is identified. No gallstones identified. Pancreas: Within the limitations of unenhanced technique no pancreas abnormality identified. No main duct dilatation or mass. Spleen:  Normal appearance of the spleen. Adrenals/Urinary Tract: Normal appearance of the adrenal glands. No kidney mass or hydronephrosis identified. Stomach/Bowel: The stomach is normal. The visualized bowel loops within the upper abdomen are unremarkable. Vascular/Lymphatic: Normal appearance of the abdominal aorta. No retroperitoneal adenopathy. Other: There is a small amount of free fluid identified within the upper abdomen. Musculoskeletal: No suspicious bone lesions identified. IMPRESSION: 1. Examination confirms presence of large heterogeneous mass involving almost the entire segment 4 of the liver. A smaller lesion is identified within the lateral aspect of segment 7. Imaging findings are compatible with either primary neoplasm of the liver such as cholangiocarcinoma or metastatic disease. The dominant mass should be easily amendable to image guided percutaneous biopsy. Additionally, consider further investigation with PET-CT to assess for distant metastatic disease. 2. Small pleural effusions. Electronically Signed   By: Kerby Moors M.D.   On: 01/10/2018 10:25   Dg Chest Port 1 View  Result Date: 01/09/2018 CLINICAL DATA:  Fever, nausea and body aches for  6 days. EXAM: PORTABLE CHEST 1 VIEW COMPARISON:  January 09, 2018 11:20 a.m. FINDINGS: The mediastinal contour is normal. The heart size is mildly enlarged. The lung volumes are low. There is no focal pneumonia, pulmonary edema, or pleural effusion. The visualized skeletal structures are unremarkable. IMPRESSION: Mild enlarged cardiac silhouette. Low lung volumes. No focal pneumonia or frank pulmonary edema is noted.  Electronically Signed   By: Abelardo Diesel M.D.   On: 01/09/2018 16:10    Labs:  CBC: Recent Labs    01/09/18 1151 01/09/18 1655  WBC 9.9 12.0*  HGB 12.3 10.4*  HCT 36.6 31.5*  PLT 93* 58*    COAGS: Recent Labs    01/09/18 1151  INR 1.24    BMP: Recent Labs    01/09/18 1151 01/09/18 1655 01/09/18 2011 01/10/18 0228  NA 127*  --  132* 134*  K 2.8*  --  3.5 2.8*  CL 87*  --  95* 99  CO2 25  --  22 21*  GLUCOSE 116*  --  101* 86  BUN 38*  --  39* 45*  CALCIUM 7.6*  --  7.0* 6.8*  CREATININE 2.48* 2.32* 2.43* 3.01*  GFRNONAA 20* 21* 20* 16*  GFRAA 23* 25* 23* 18*    LIVER FUNCTION TESTS: Recent Labs    01/09/18 1151  BILITOT 3.3*  AST 437*  ALT 464*  ALKPHOS 86  PROT 6.1*  ALBUMIN 2.9*    TUMOR MARKERS: No results for input(s): AFPTM, CEA, CA199, CHROMGRNA in the last 8760 hours.  Assessment and Plan: Hepatic mass. Favor neoplastic process though presented with septic picture. After review of imaging, will plan for CT guided, though if return of purulent material, may need to leave drain intact for decompression. Labs reviewed. PLTs down to 58k. Will plan for PLT transfusion before and during procedure tomorrow. NPO p MN. Risks and benefits discussed with the patient and her husband including, but not limited to bleeding, infection, damage to adjacent structures, need for drain placement, or low yield requiring additional tests.  All of the patient's questions were answered, patient is agreeable to proceed. Consent signed and in chart.    Thank you for this interesting consult.  I greatly enjoyed meeting LAVINIA MCNEELY and look forward to participating in their care.  A copy of this report was sent to the requesting provider on this date.  Electronically Signed: Ascencion Dike, PA-C 01/10/2018, 2:50 PM   I spent a total of 25 minutes in face to face in clinical consultation, greater than 50% of which was counseling/coordinating care for hepatic  mass biopsy

## 2018-01-10 NOTE — Progress Notes (Signed)
PULMONARY / CRITICAL CARE MEDICINE   Name: Lisa Bauer MRN: [id number] DOB: [birth year]    ADMISSION DATE:  01/09/2018   CC: malaise , abdominal pain, fever, low blood pressure  HPI:  This patient presents with malaise, achiness epigastric abdominal pain x several days now. Has had fever and chillls more recently. Has had a few bouts of diarrthea and nausea/vomiting. He has no history of liver,pancreatic or gallbaldder disease. She denies recent travel.  Has not had any invasive procedures or surgeries in the past 2-3 months. The patieint was hypotensive on presentation. Her initial lactate was 3.05. Her WBC is within normal limits. Her temp on presentation was 100.8. Her LFTs show mild transaminase elevation to 3.3 and moderate elevation of her LFTs. The patient is basically a very healthy woman. Has not had any recent weight loss. No recent travel. The patient initially had a bP in the 70-80s but it has slowly risen after fluid resuscitation and was most recently 97/65. The patient is alert and does not appear toxic at this time.  Has normal pulse ox and respiratory effort.   SUBJECTIVE:  MRI abdomen performed, results pending.  IR consult pending (? Liver biopsy).  Vitals stable, BP much improved without pressors.  Pt feels bloated this AM but did have BM earlier that was normal per RN.    VITAL SIGNS: BP 96/72   Pulse 83   Temp 98.4 F (36.9 C) (Oral)   Resp (!) 21   Ht 6' (1.829 m)   Wt 84.4 kg   LMP  (LMP Unknown)   SpO2 100%   BMI 25.24 kg/m        INTAKE / OUTPUT: I/O last 3 completed shifts: In: 4718.2 [I.V.:628; IV Piggyback:4090.2] Out: -   PHYSICAL EXAMINATION: General: Adult female, no distress Neuro:  Alert and oriented, MAE HEENT: [place]/AT, MMM Cardiovascular:  RRR, no M/R/G Lungs:  Normal effort, CTAB Abdomen:  BS x 4, soft, tenderness in epigastrium and RUQ  Extr: No deformities, no edema Skin:  Warm and dry  LABS:  BMET Recent Labs  Lab  01/09/18 1151 01/09/18 1655 01/09/18 2011 01/10/18 0228  NA 127*  --  132* 134*  K 2.8*  --  3.5 2.8*  CL 87*  --  95* 99  CO2 25  --  22 21*  BUN 38*  --  39* 45*  CREATININE 2.48* 2.32* 2.43* 3.01*  GLUCOSE 116*  --  101* 86    Electrolytes Recent Labs  Lab 01/09/18 1151 01/09/18 1443 01/09/18 2011 01/10/18 0228  CALCIUM 7.6*  --  7.0* 6.8*  MG  --  1.6*  --  1.6*  PHOS  --   --   --  2.4*    CBC Recent Labs  Lab 01/09/18 1151 01/09/18 1655  WBC 9.9 12.0*  HGB 12.3 10.4*  HCT 36.6 31.5*  PLT 93* 58*    Coag's Recent Labs  Lab 01/09/18 1151  INR 1.24    Sepsis Markers Recent Labs  Lab 01/09/18 1202 01/09/18 1329 01/10/18 0228  LATICACIDVEN 3.05* 2.70* 2.0*    ABG No results for input(s): PHART, PCO2ART, PO2ART in the last 168 hours.  Liver Enzymes Recent Labs  Lab 01/09/18 1151  AST 437*  ALT 464*  ALKPHOS 86  BILITOT 3.3*  ALBUMIN 2.9*    Cardiac Enzymes No results for input(s): TROPONINI, PROBNP in the last 168 hours.  Glucose Recent Labs  Lab 01/09/18 1640 01/10/18 0843  GLUCAP 103* 95  Imaging Ct Abdomen Pelvis Wo Contrast  Result Date: 01/09/2018 CLINICAL DATA:  62 year old female with a history of abdominal pain with nausea and vomiting EXAM: CT ABDOMEN AND PELVIS WITHOUT CONTRAST TECHNIQUE: Multidetector CT imaging of the abdomen and pelvis was performed following the standard protocol without IV contrast. COMPARISON:  09/24/2016 FINDINGS: Lower chest: No pleural effusion or confluent airspace disease. Hepatobiliary: Heterogeneously attenuating mass of the liver, prominently within segment 4, though extending into the right liver, segment 2 and segment 3. Estimated diameter on the axial images 9.5 cm x 8.7 cm. Additional nodule/mass within the periphery of the right liver on image 19. Gallbladder is relatively decompressed.  No radiopaque gallstones. Pancreas: Unremarkable appearance of pancreas Spleen: Unremarkable spleen  Adrenals/Urinary Tract: Unremarkable adrenal glands. Bilateral kidneys without hydronephrosis or nephrolithiasis. Unremarkable course the bilateral ureters. Urinary bladder is relatively decompressed. Stomach/Bowel: Stomach unremarkable. No abnormally distended small bowel. No transition point. No focal inflammatory changes of the small bowel mesentery. Normal appendix. Colon is decompressed. Vascular/Lymphatic: No significant vascular calcifications. No mesenteric, retroperitoneal, or inguinal adenopathy. Reproductive: Fibroid changes of the uterus. Other: No large ventral wall hernia. Musculoskeletal: Grade 1 anterolisthesis of L4 on L5. Vacuum disc phenomenon at L4-L5 and L5-S1. No bony canal narrowing. Degenerative changes of the facets. Mild degenerative changes of the hips. No acute fracture IMPRESSION: Heterogeneously attenuating mass within the liver, predominantly segment 4 measuring 9.5 cm by 8.7 cm. Differential includes abscess, primary liver tumor/biliary tumor, or metastases. Additional small pathologic focus within the lateral aspect of the right liver measuring approximately 2.6 cm. These results were called by telephone at the time of interpretation on 01/09/2018 at 2:25 pm to Dr. Lajean Saver , who verbally acknowledged these results. Fibroid changes of the uterus. Electronically Signed   By: Corrie Mckusick D.O.   On: 01/09/2018 14:28   Dg Chest 2 View  Result Date: 01/09/2018 CLINICAL DATA:  Fever, weakness and dizziness over the last 3 days. EXAM: CHEST - 2 VIEW COMPARISON:  09/23/2017 FINDINGS: Heart and mediastinal shadows are normal. Lung volumes are diminished which could be due to a poor inspiration for underlying lung disease. Lung markings appear more prominent diffusely, which again could relate to the poor inspiration or could indicate mild diffuse pneumonitis. No dense consolidation or lobar collapse. No effusions. No significant bone finding. IMPRESSION: Poor inspiration versus  widespread pneumonitis. No dense consolidation or collapse. Electronically Signed   By: Nelson Chimes M.D.   On: 01/09/2018 11:29   Dg Chest Port 1 View  Result Date: 01/09/2018 CLINICAL DATA:  Fever, nausea and body aches for 6 days. EXAM: PORTABLE CHEST 1 VIEW COMPARISON:  January 09, 2018 11:20 a.m. FINDINGS: The mediastinal contour is normal. The heart size is mildly enlarged. The lung volumes are low. There is no focal pneumonia, pulmonary edema, or pleural effusion. The visualized skeletal structures are unremarkable. IMPRESSION: Mild enlarged cardiac silhouette. Low lung volumes. No focal pneumonia or frank pulmonary edema is noted. Electronically Signed   By: Abelardo Diesel M.D.   On: 01/09/2018 16:10   STUDIES: CT A / P 8/7 > mass in liver 9.5cm x 8.7cm. DDx includes abscess, tumor, mets.  Additional small focus in lateral aspect of right liver measuring 2.6cm. MRI abd 8/8 > large mass with smaller lesion in liver.  Imaging findings concerning for primary neoplasm of liver such as cholangiocarcinoma or metastatic disease.  ANTIBIOTICS: Vanc 8/7 >  Zosyn 8/7 >   MICRO: Blood 8/7 >  Stool 8/7 >  DISCUSSION: The patient presents with an amorphous large right liver density with fever.the differtial includes a hepatic abscess, liver tumor or metastatic disease. There was no gross evidence of disease elsewhere. No history of cancer and weight has been stable. The patient was hypotensive but her BP appears to be gradually creeping up. Had mamoderate elevation of transaminases and bili of 3. The patient has an element of AKI with a creatinine of  2.48. No history of kidney probems   ASSESSMENT / PLAN:   Hypotension / sepsis - unclear source, ? Hepatic abcess  Briefly treated with neo but responded well.  Now normotensive with normal fluids.  Lactate improved. Plan: Continue fluids and abx. Follow cultures. Will discuss with IR regarding biopsy as well as consult GI.  Hx  HTN. Plan: Hydralazine PRN. Hold preadmission lisinopril-HCTZ.  AKI related to poor intake, hypotension, ACEi Hypokalemia - s/p 4 runs K Hyponatremia - likely hypovolemic Hypomagnesemia. Hypophosphatemia. Plan: Continue fluids. 2g Mag. 30 mmol Kphos. Follow BMP.  ? liver abscess vs hepatoma vs metastatic disease - hx favored abscess / sepsis given acute onset; however, MRI concerning for primary neoplasm (cholangiocarcinoma or mets). Elevated LFTs - likely low flow state with hypotension Plan: Will discuss with IR regarding biopsy as well as consult GI. Continue empiric abx and follow cultures.  Thrombocytopenia - presumed due to sepsis. Plan: Follow platelet counts and monitor for any bleeding (might need to hold off on biopsy another 24hrs or so). D/c lovenox.   Stabilized overnight with brief neo. Now needs IR and GI consults for liver biopsy, then likely oncology follow up. Transfer to SDU and will ask [facility] to assume care in AM 8/9 with PCCM off at that time.   Montey Hora, [facility] Pager: [phone]  or [phone] 01/10/2018, 10:46 AM

## 2018-01-11 ENCOUNTER — Encounter (HOSPITAL_COMMUNITY): Payer: Self-pay

## 2018-01-11 ENCOUNTER — Inpatient Hospital Stay (HOSPITAL_COMMUNITY)

## 2018-01-11 ENCOUNTER — Other Ambulatory Visit: Payer: Self-pay

## 2018-01-11 ENCOUNTER — Inpatient Hospital Stay: Payer: Self-pay

## 2018-01-11 DIAGNOSIS — D649 Anemia, unspecified: Secondary | ICD-10-CM | POA: Diagnosis present

## 2018-01-11 DIAGNOSIS — J9601 Acute respiratory failure with hypoxia: Secondary | ICD-10-CM

## 2018-01-11 DIAGNOSIS — N179 Acute kidney failure, unspecified: Secondary | ICD-10-CM | POA: Diagnosis present

## 2018-01-11 DIAGNOSIS — R7881 Bacteremia: Secondary | ICD-10-CM

## 2018-01-11 DIAGNOSIS — J690 Pneumonitis due to inhalation of food and vomit: Secondary | ICD-10-CM

## 2018-01-11 DIAGNOSIS — B955 Unspecified streptococcus as the cause of diseases classified elsewhere: Secondary | ICD-10-CM | POA: Diagnosis present

## 2018-01-11 DIAGNOSIS — R7989 Other specified abnormal findings of blood chemistry: Secondary | ICD-10-CM | POA: Diagnosis present

## 2018-01-11 DIAGNOSIS — E876 Hypokalemia: Secondary | ICD-10-CM | POA: Insufficient documentation

## 2018-01-11 DIAGNOSIS — R160 Hepatomegaly, not elsewhere classified: Secondary | ICD-10-CM

## 2018-01-11 DIAGNOSIS — E872 Acidosis, unspecified: Secondary | ICD-10-CM | POA: Insufficient documentation

## 2018-01-11 DIAGNOSIS — R509 Fever, unspecified: Secondary | ICD-10-CM

## 2018-01-11 DIAGNOSIS — D696 Thrombocytopenia, unspecified: Secondary | ICD-10-CM

## 2018-01-11 DIAGNOSIS — R1084 Generalized abdominal pain: Secondary | ICD-10-CM

## 2018-01-11 DIAGNOSIS — R945 Abnormal results of liver function studies: Secondary | ICD-10-CM

## 2018-01-11 DIAGNOSIS — A419 Sepsis, unspecified organism: Secondary | ICD-10-CM | POA: Diagnosis present

## 2018-01-11 DIAGNOSIS — R6521 Severe sepsis with septic shock: Secondary | ICD-10-CM

## 2018-01-11 LAB — BASIC METABOLIC PANEL
Anion gap: 15 (ref 5–15)
BUN: 63 mg/dL — ABNORMAL HIGH (ref 8–23)
CALCIUM: 6.7 mg/dL — AB (ref 8.9–10.3)
CO2: 17 mmol/L — AB (ref 22–32)
Chloride: 101 mmol/L (ref 98–111)
Creatinine, Ser: 3.72 mg/dL — ABNORMAL HIGH (ref 0.44–1.00)
GFR calc non Af Amer: 12 mL/min — ABNORMAL LOW (ref >60)
GFR, EST AFRICAN AMERICAN: 14 mL/min — AB (ref >60)
Glucose, Bld: 110 mg/dL — ABNORMAL HIGH (ref 70–99)
POTASSIUM: 3.4 mmol/L — AB (ref 3.5–5.1)
Sodium: 133 mmol/L — ABNORMAL LOW (ref 135–145)

## 2018-01-11 LAB — CBC
HEMATOCRIT: 31.3 % — AB (ref 36.0–46.0)
HEMATOCRIT: 27.2 % — AB (ref 36.0–46.0)
HEMOGLOBIN: 10.6 g/dL — AB (ref 12.0–15.0)
HEMOGLOBIN: 9.3 g/dL — AB (ref 12.0–15.0)
MCH: 28.7 pg (ref 26.0–34.0)
MCH: 29.2 pg (ref 26.0–34.0)
MCHC: 33.9 g/dL (ref 30.0–36.0)
MCHC: 34.2 g/dL (ref 30.0–36.0)
MCV: 84.8 fL (ref 78.0–100.0)
MCV: 85.3 fL (ref 78.0–100.0)
Platelets: 54 10*3/uL — ABNORMAL LOW (ref 150–400)
Platelets: 59 10*3/uL — ABNORMAL LOW (ref 150–400)
RBC: 3.69 MIL/uL — ABNORMAL LOW (ref 3.87–5.11)
RBC: 3.19 MIL/uL — AB (ref 3.87–5.11)
RDW: 14.2 % (ref 11.5–15.5)
RDW: 14.6 % (ref 11.5–15.5)
WBC: 14.9 10*3/uL — AB (ref 4.0–10.5)
WBC: 18.3 10*3/uL — AB (ref 4.0–10.5)

## 2018-01-11 LAB — CEA: CEA1: 0.8 ng/mL (ref 0.0–4.7)

## 2018-01-11 LAB — HEPATIC FUNCTION PANEL
ALT: 319 U/L — AB (ref 0–44)
AST: 273 U/L — ABNORMAL HIGH (ref 15–41)
Albumin: 2.2 g/dL — ABNORMAL LOW (ref 3.5–5.0)
Alkaline Phosphatase: 70 U/L (ref 38–126)
BILIRUBIN INDIRECT: 1.2 mg/dL — AB (ref 0.3–0.9)
Bilirubin, Direct: 1.2 mg/dL — ABNORMAL HIGH (ref 0.0–0.2)
TOTAL PROTEIN: 5.1 g/dL — AB (ref 6.5–8.1)
Total Bilirubin: 2.4 mg/dL — ABNORMAL HIGH (ref 0.3–1.2)

## 2018-01-11 LAB — AFP TUMOR MARKER: AFP, Serum, Tumor Marker: 4.1 ng/mL (ref 0.0–8.3)

## 2018-01-11 LAB — IRON AND TIBC
Iron: 15 ug/dL — ABNORMAL LOW (ref 28–170)
SATURATION RATIOS: 9 % — AB (ref 10.4–31.8)
TIBC: 158 ug/dL — ABNORMAL LOW (ref 250–450)
UIBC: 143 ug/dL

## 2018-01-11 LAB — CORTISOL: CORTISOL PLASMA: 43.5 ug/dL

## 2018-01-11 LAB — VITAMIN B12: Vitamin B-12: 3227 pg/mL — ABNORMAL HIGH (ref 180–914)

## 2018-01-11 LAB — RETICULOCYTES
RBC.: 3.67 MIL/uL — AB (ref 3.87–5.11)
RETIC COUNT ABSOLUTE: 22 10*3/uL (ref 19.0–186.0)
Retic Ct Pct: 0.6 % (ref 0.4–3.1)

## 2018-01-11 LAB — PHOSPHORUS: Phosphorus: 3.8 mg/dL (ref 2.5–4.6)

## 2018-01-11 LAB — CREATININE, URINE, RANDOM: Creatinine, Urine: 76.05 mg/dL

## 2018-01-11 LAB — MAGNESIUM: MAGNESIUM: 2.4 mg/dL (ref 1.7–2.4)

## 2018-01-11 LAB — LACTIC ACID, PLASMA: LACTIC ACID, VENOUS: 2.7 mmol/L — AB (ref 0.5–1.9)

## 2018-01-11 LAB — SODIUM, URINE, RANDOM

## 2018-01-11 LAB — FERRITIN: Ferritin: 487 ng/mL — ABNORMAL HIGH (ref 11–307)

## 2018-01-11 LAB — FOLATE: FOLATE: 14.5 ng/mL (ref >5.9)

## 2018-01-11 MED ORDER — SODIUM CHLORIDE 0.9% FLUSH
10.0000 mL | INTRAVENOUS | Status: DC | PRN
  Administered 2018-01-13: 10 mL
  Administered 2018-01-17: 40 mL
  Administered 2018-01-19 – 2018-01-20 (×2): 10 mL
  Administered 2018-01-20: 20 mL
  Administered 2018-01-23: 10 mL
  Filled 2018-01-11 (×6): qty 40

## 2018-01-11 MED ORDER — ACETAMINOPHEN 325 MG PO TABS
650.0000 mg | ORAL_TABLET | Freq: Four times a day (QID) | ORAL | Status: DC | PRN
  Administered 2018-01-11: 650 mg via ORAL
  Filled 2018-01-11: qty 2

## 2018-01-11 MED ORDER — FENTANYL CITRATE (PF) 100 MCG/2ML IJ SOLN
INTRAMUSCULAR | Status: AC | PRN
  Administered 2018-01-11: 25 ug via INTRAVENOUS
  Administered 2018-01-11: 50 ug via INTRAVENOUS

## 2018-01-11 MED ORDER — POTASSIUM CHLORIDE 10 MEQ/100ML IV SOLN
10.0000 meq | INTRAVENOUS | Status: AC
  Administered 2018-01-11: 10 meq via INTRAVENOUS
  Filled 2018-01-11: qty 100

## 2018-01-11 MED ORDER — MIDAZOLAM HCL 2 MG/2ML IJ SOLN
INTRAMUSCULAR | Status: AC | PRN
  Administered 2018-01-11: 1 mg via INTRAVENOUS
  Administered 2018-01-11: 0.5 mg via INTRAVENOUS

## 2018-01-11 MED ORDER — POTASSIUM CHLORIDE CRYS ER 20 MEQ PO TBCR
40.0000 meq | EXTENDED_RELEASE_TABLET | Freq: Once | ORAL | Status: DC
  Filled 2018-01-11: qty 2

## 2018-01-11 MED ORDER — MIDAZOLAM HCL 2 MG/2ML IJ SOLN
INTRAMUSCULAR | Status: AC
  Filled 2018-01-11: qty 2

## 2018-01-11 MED ORDER — SODIUM CHLORIDE 0.9 % IV BOLUS
1000.0000 mL | Freq: Once | INTRAVENOUS | Status: AC
  Administered 2018-01-11: 1000 mL via INTRAVENOUS

## 2018-01-11 MED ORDER — SODIUM CHLORIDE 0.9% FLUSH
10.0000 mL | Freq: Two times a day (BID) | INTRAVENOUS | Status: DC
  Administered 2018-01-11 – 2018-01-12 (×2): 20 mL
  Administered 2018-01-12: 10 mL

## 2018-01-11 MED ORDER — LIDOCAINE HCL (PF) 1 % IJ SOLN
INTRAMUSCULAR | Status: AC
  Filled 2018-01-11: qty 30

## 2018-01-11 MED ORDER — SODIUM CHLORIDE 0.9% FLUSH
5.0000 mL | Freq: Three times a day (TID) | INTRAVENOUS | Status: DC
  Administered 2018-01-11 – 2018-01-18 (×13): 5 mL
  Administered 2018-01-19: 20:00:00
  Administered 2018-01-20 – 2018-01-23 (×5): 5 mL

## 2018-01-11 MED ORDER — FENTANYL CITRATE (PF) 100 MCG/2ML IJ SOLN
INTRAMUSCULAR | Status: AC
  Filled 2018-01-11: qty 2

## 2018-01-11 NOTE — Consult Note (Addendum)
Reason for Consult: AKI  Referring Physician: Karleen Hampshire, MD  Lisa Bauer is an 62 y.o. female.  HPI: Pt is a 62 yo AAF with a PMH significant for HTN who present to [facility] on 01/09/18 with a 1 week history of malaise, fatigue, and fevers.  She was started on antibiotics as an outpatient for suspected UTI, however her fevers persisted as well as her symptoms worsened.  She has had N/V as well as loose stools.  Her workup included a CT scan of the abdomen and pelvis which revealed a large mass in her liver thought to be a possible abscess.  She was also noted to be hypotensive in the ED which responded to IVF's and short term vasopressors.  We were consulted due to worsening renal function.  Her trend in Scr is seen below.    Her Cr was 0.89 on 01/07/18 when she was treated with macrobid and pyridium for possible UTI.  Her blood cultures and liver aspiration are growing gram positive cocci and her blood cultures revealed streptococcus constellatus.  Of note she had been taking lisinopril/hct prior to her admission.  She was seen following her liver aspiration and developed respiratory distress and wheezing.  She was also noted to be hypoxic and was started on oxygen.  She reports that it was a sudden onset and denied any history of asthma.  She has had good urine output over the last 24 hours but did notice decrease urine production prior to admission.  She was also hypotensive following aspiration for several hours.   Trend in Creatinine: Creatinine, Ser  Date/Time Value Ref Range Status  01/11/2018 03:56 AM 3.72 (H) 0.44 - 1.00 mg/dL Final  01/10/2018 02:28 AM 3.01 (H) 0.44 - 1.00 mg/dL Final  01/09/2018 08:11 PM 2.43 (H) 0.44 - 1.00 mg/dL Final  01/09/2018 04:55 PM 2.32 (H) 0.44 - 1.00 mg/dL Final  01/09/2018 11:51 AM 2.48 (H) 0.44 - 1.00 mg/dL Final  09/23/2016 06:46 PM 0.61 0.44 - 1.00 mg/dL Final    PMH:   Past Medical History:  Diagnosis Date  . Hypertension     PSH:  History reviewed. No  pertinent surgical history.  Allergies: No Known Allergies  Medications:   Prior to Admission medications   Medication Sig Start Date End Date Taking? Authorizing Provider  cholecalciferol (VITAMIN D) 1000 units tablet Take 1,000 Units by mouth daily.   Yes [provider]  fluconazole (DIFLUCAN) 150 MG tablet Take 150 mg by mouth See admin instructions. Take 1 tablet on 01/07/18. Repeat dose in 3 days (01/10/18) 01/07/18 01/11/18 Yes [provider]  lisinopril-hydrochlorothiazide (PRINZIDE,ZESTORETIC) 20-25 MG tablet Take 1 tablet by mouth at bedtime.  07/14/16  Yes [provider]  nitrofurantoin, macrocrystal-monohydrate, (MACROBID) 100 MG capsule Take 100 mg by mouth 2 (two) times daily. 01/07/18 01/12/18 Yes [provider]  ondansetron (ZOFRAN-ODT) 8 MG disintegrating tablet Take 8 mg by mouth every 8 (eight) hours as needed for nausea or vomiting.   Yes [provider]  phenazopyridine (PYRIDIUM) 200 MG tablet Take 200 mg by mouth 3 (three) times daily as needed for pain.   Yes [provider]  TURMERIC PO Take 2 capsules by mouth at bedtime.   Yes [provider]    Inpatient medications: . fentaNYL      . lidocaine (PF)      . midazolam      . sodium chloride flush  10-40 mL Intracatheter Q12H  . sodium chloride flush  5  mL Intracatheter Q8H    Discontinued Meds:   Medications Discontinued During This Encounter  Medication Reason  . vancomycin (VANCOCIN) IVPB 1000 mg/200 mL premix   . potassium chloride 10 mEq in 100 mL IVPB   . lactated ringers with KCl 20 mEq/L infusion   . vancomycin (VANCOCIN) IVPB 1000 mg/200 mL premix   . metroNIDAZOLE (FLAGYL) IVPB 500 mg   . hydrocortisone sodium succinate (SOLU-CORTEF) 100 MG injection 100 mg   . enoxaparin (LOVENOX) injection 30 mg   . famotidine (PEPCID) IVPB 20 mg premix   . HYDROmorphone (DILAUDID) injection 1 mg   . lactated ringers 1,000 mL with potassium chloride 20 mEq  infusion   . phenylephrine (NEOSYNEPHRINE) 10-0.9 MG/250ML-% infusion   . magnesium sulfate IVPB 2 g 50 mL   . vancomycin (VANCOCIN) IVPB 1000 mg/200 mL premix   . potassium chloride SA (K-DUR,KLOR-CON) CR tablet 40 mEq   . 0.9 %  sodium chloride infusion (Manually program via Guardrails IV Fluids)     Social History:  reports that she has never smoked. She has never used smokeless tobacco. She reports that she does not drink alcohol or use drugs.  Family History:  History reviewed. No pertinent family history.  Pertinent items are noted in HPI. Weight change: 9.5 kg  Intake/Output Summary (Last 24 hours) at 01/11/2018 2035 Last data filed at 01/11/2018 1900 Gross per 24 hour  Intake 2318.74 ml  Output 910 ml  Net 1408.74 ml   BP (!) 76/60   Pulse 73   Temp 97.7 F (36.5 C) (Oral)   Resp (!) 21   Ht 6' (1.829 m)   Wt 93.9 kg   LMP  (LMP Unknown)   SpO2 100%   BMI 28.08 kg/m  Vitals:   01/11/18 1801 01/11/18 1815 01/11/18 1819 01/11/18 1824  BP:  (!) 63/52 (!) 77/56 (!) 76/60  Pulse:  100 73 73  Resp: (!) 23 (!) 26 20 (!) 21  Temp:      TempSrc:      SpO2:  (!) 74% 100% 100%  Weight:      Height:         General appearance: moderate distress Head: Normocephalic, without obvious abnormality, atraumatic Resp: wheezes bilaterally Cardio: regular rate and rhythm, S1, S2 normal, no murmur, click, rub or gallop GI: +BS, soft, tenderness in RUQ Extremities: extremities normal, atraumatic, no cyanosis or edema  Labs: Basic Metabolic Panel: Recent Labs  Lab 01/09/18 1151 01/09/18 1655 01/09/18 2011 01/10/18 0228 01/11/18 0356 01/11/18 1105  NA 127*  --  132* 134* 133*  --   K 2.8*  --  3.5 2.8* 3.4*  --   CL 87*  --  95* 99 101  --   CO2 25  --  22 21* 17*  --   GLUCOSE 116*  --  101* 86 110*  --   BUN 38*  --  39* 45* 63*  --   CREATININE 2.48* 2.32* 2.43* 3.01* 3.72*  --   ALBUMIN 2.9*  --   --   --   --  2.2*  CALCIUM 7.6*  --  7.0* 6.8* 6.7*  --   PHOS  --    --   --  2.4* 3.8  --    Liver Function Tests: Recent Labs  Lab 01/09/18 1151 01/11/18 1105  AST 437* 273*  ALT 464* 319*  ALKPHOS 86 70  BILITOT 3.3* 2.4*  PROT 6.1* 5.1*  ALBUMIN 2.9* 2.2*  Recent Labs  Lab 01/09/18 1151  LIPASE 24   No results for input(s): AMMONIA in the last 168 hours. CBC: Recent Labs  Lab 01/09/18 1151 01/09/18 1655 01/11/18 0356 01/11/18 1829  WBC 9.9 12.0* 14.9* 18.3*  NEUTROABS 8.4*  --   --   --   HGB 12.3 10.4* 10.6* 9.3*  HCT 36.6 31.5* 31.3* 27.2*  MCV 87.6 89.0 84.8 85.3  PLT 93* 58* 54* 59*   PT/INR: @LABRCNTIP (inr:5) Cardiac Enzymes: )No results for input(s): CKTOTAL, CKMB, CKMBINDEX, TROPONINI in the last 168 hours. CBG: Recent Labs  Lab 01/09/18 1640 01/10/18 0843 01/10/18 1624  GLUCAP 103* 95 125*    Iron Studies:  Recent Labs  Lab 01/11/18 1105  IRON 15*  TIBC 158*  FERRITIN 487*    Xrays/Other Studies: US Guided Needle Placement  Result Date: 01/11/2018 INDICATION: Heterogeneous mass versus abscess of the liver with liquefied component. Presentation with abdominal pain and sepsis with positive blood cultures. EXAM: 1. ULTRASOUND-GUIDED CORE BIOPSY LIVER 2. ULTRASOUND GUIDED PERCUTANEOUS CATHETER DRAINAGE OF LIVER MEDICATIONS: The patient is currently admitted to the hospital and receiving intravenous antibiotics. The antibiotics were administered within an appropriate time frame prior to the initiation of the procedure. ANESTHESIA/SEDATION: Fentanyl 75 mcg IV; Versed 1.5 mg IV Moderate Sedation Time:  31 minutes. The patient was continuously monitored during the procedure by the interventional radiology nurse under my direct supervision. COMPLICATIONS: None immediate. PROCEDURE: Informed written consent was obtained from the patient after a thorough discussion of the procedural risks, benefits and alternatives. All questions were addressed. Maximal Sterile Barrier Technique was utilized including caps, mask, sterile  gowns, sterile gloves, sterile drape, hand hygiene and skin antiseptic. A timeout was performed prior to the initiation of the procedure. Ultrasound was performed of the liver. Under ultrasound guidance, a 17 gauge needle was advanced into the right lobe. 18 gauge core biopsy samples were obtained with 3 samples submitted in formalin. The 17 gauge needle was then advanced into a liquified component. Aspiration of fluid was performed and sample sent for both culture analysis and cytology. A guidewire was advanced through the needle. The tract was dilated and a 10 French percutaneous drainage catheter advanced. Catheter position was confirmed by ultrasound. The catheter was connected to a suction bulb. The drainage catheter was secured at the skin with a Prolene retention suture and adhesive StatLock device. FINDINGS: Large heterogeneous abnormality of the liver is present with ill-defined borders. The area of parenchymal abnormality spans at least an area 11 cm. The echogenic rim surrounding a liquified component was sampled with tissue biopsy. Aspiration at the level of the liquefied component yielded bloody and turbid fluid. A 10 French drain was placed in this collection which is draining bloody fluid. IMPRESSION: Ultrasound-guided biopsy of the liver lesion along the edge of the liquefied component followed by aspiration of the liquefied component yielding bloody and turbid fluid which was sent for culture analysis and cytology. Additional placement of a 10 French percutaneous drainage catheter into the liquefied component which was attached to suction bulb drainage. Electronically Signed   By: Aletta Edouard M.D.   On: 01/11/2018 17:15   Mr Abdomen Mrcp Wo Contrast  Result Date: 01/10/2018 CLINICAL DATA:  Abdominal pain. 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 EXAM: MRI ABDOMEN WITHOUT CONTRAST  (INCLUDING MRCP) TECHNIQUE: Multiplanar multisequence MR imaging of the abdomen was performed. Heavily  T2-weighted images of the biliary and pancreatic ducts were obtained, and three-dimensional MRCP images were rendered by post processing. COMPARISON:  01/09/2018 FINDINGS:  Lower chest: Small bilateral pleural effusions identified. Hepatobiliary: There is a large heterogeneous mass involving much of the entirety of segment 4. The mass measures 10.2 by 10.5 by 9.4 cm, image 14/6. A second smaller lesion is identified within the periphery of segment 7 measuring 2.7 cm, image 29/2. The common bile duct has a normal caliber. Pericholecystic fluid is identified. No gallstones identified. Pancreas: Within the limitations of unenhanced technique no pancreas abnormality identified. No main duct dilatation or mass. Spleen:  Normal appearance of the spleen. Adrenals/Urinary Tract: Normal appearance of the adrenal glands. No kidney mass or hydronephrosis identified. Stomach/Bowel: The stomach is normal. The visualized bowel loops within the upper abdomen are unremarkable. Vascular/Lymphatic: Normal appearance of the abdominal aorta. No retroperitoneal adenopathy. Other: There is a small amount of free fluid identified within the upper abdomen. Musculoskeletal: No suspicious bone lesions identified. IMPRESSION: 1. Examination confirms presence of large heterogeneous mass involving almost the entire segment 4 of the liver. A smaller lesion is identified within the lateral aspect of segment 7. Imaging findings are compatible with either primary neoplasm of the liver such as cholangiocarcinoma or metastatic disease. The dominant mass should be easily amendable to image guided percutaneous biopsy. Additionally, consider further investigation with PET-CT to assess for distant metastatic disease. 2. Small pleural effusions. Electronically Signed   By: Kerby Moors M.D.   On: 01/10/2018 10:25   Mr 3d Recon At Scanner  Result Date: 01/10/2018 CLINICAL DATA:  Abdominal pain. 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 EXAM: MRI  ABDOMEN WITHOUT CONTRAST  (INCLUDING MRCP) TECHNIQUE: Multiplanar multisequence MR imaging of the abdomen was performed. Heavily T2-weighted images of the biliary and pancreatic ducts were obtained, and three-dimensional MRCP images were rendered by post processing. COMPARISON:  01/09/2018 FINDINGS: Lower chest: Small bilateral pleural effusions identified. Hepatobiliary: There is a large heterogeneous mass involving much of the entirety of segment 4. The mass measures 10.2 by 10.5 by 9.4 cm, image 14/6. A second smaller lesion is identified within the periphery of segment 7 measuring 2.7 cm, image 29/2. The common bile duct has a normal caliber. Pericholecystic fluid is identified. No gallstones identified. Pancreas: Within the limitations of unenhanced technique no pancreas abnormality identified. No main duct dilatation or mass. Spleen:  Normal appearance of the spleen. Adrenals/Urinary Tract: Normal appearance of the adrenal glands. No kidney mass or hydronephrosis identified. Stomach/Bowel: The stomach is normal. The visualized bowel loops within the upper abdomen are unremarkable. Vascular/Lymphatic: Normal appearance of the abdominal aorta. No retroperitoneal adenopathy. Other: There is a small amount of free fluid identified within the upper abdomen. Musculoskeletal: No suspicious bone lesions identified. IMPRESSION: 1. Examination confirms presence of large heterogeneous mass involving almost the entire segment 4 of the liver. A smaller lesion is identified within the lateral aspect of segment 7. Imaging findings are compatible with either primary neoplasm of the liver such as cholangiocarcinoma or metastatic disease. The dominant mass should be easily amendable to image guided percutaneous biopsy. Additionally, consider further investigation with PET-CT to assess for distant metastatic disease. 2. Small pleural effusions. Electronically Signed   By: Kerby Moors M.D.   On: 01/10/2018 10:25   US Biopsy  (liver)  Result Date: 01/11/2018 INDICATION: Heterogeneous mass versus abscess of the liver with liquefied component. Presentation with abdominal pain and sepsis with positive blood cultures. EXAM: 1. ULTRASOUND-GUIDED CORE BIOPSY LIVER 2. ULTRASOUND GUIDED PERCUTANEOUS CATHETER DRAINAGE OF LIVER MEDICATIONS: The patient is currently admitted to the hospital and receiving intravenous antibiotics. The antibiotics were administered within an  appropriate time frame prior to the initiation of the procedure. ANESTHESIA/SEDATION: Fentanyl 75 mcg IV; Versed 1.5 mg IV Moderate Sedation Time:  31 minutes. The patient was continuously monitored during the procedure by the interventional radiology nurse under my direct supervision. COMPLICATIONS: None immediate. PROCEDURE: Informed written consent was obtained from the patient after a thorough discussion of the procedural risks, benefits and alternatives. All questions were addressed. Maximal Sterile Barrier Technique was utilized including caps, mask, sterile gowns, sterile gloves, sterile drape, hand hygiene and skin antiseptic. A timeout was performed prior to the initiation of the procedure. Ultrasound was performed of the liver. Under ultrasound guidance, a 17 gauge needle was advanced into the right lobe. 18 gauge core biopsy samples were obtained with 3 samples submitted in formalin. The 17 gauge needle was then advanced into a liquified component. Aspiration of fluid was performed and sample sent for both culture analysis and cytology. A guidewire was advanced through the needle. The tract was dilated and a 10 French percutaneous drainage catheter advanced. Catheter position was confirmed by ultrasound. The catheter was connected to a suction bulb. The drainage catheter was secured at the skin with a Prolene retention suture and adhesive StatLock device. FINDINGS: Large heterogeneous abnormality of the liver is present with ill-defined borders. The area of parenchymal  abnormality spans at least an area 11 cm. The echogenic rim surrounding a liquified component was sampled with tissue biopsy. Aspiration at the level of the liquefied component yielded bloody and turbid fluid. A 10 French drain was placed in this collection which is draining bloody fluid. IMPRESSION: Ultrasound-guided biopsy of the liver lesion along the edge of the liquefied component followed by aspiration of the liquefied component yielding bloody and turbid fluid which was sent for culture analysis and cytology. Additional placement of a 10 French percutaneous drainage catheter into the liquefied component which was attached to suction bulb drainage. Electronically Signed   By: Aletta Edouard M.D.   On: 01/11/2018 17:15   Dg Chest Port 1 View  Result Date: 01/11/2018 CLINICAL DATA:  Dyspnea. EXAM: PORTABLE CHEST 1 VIEW COMPARISON:  Radiograph of January 09, 2018. FINDINGS: Stable cardiomediastinal silhouette. No pneumothorax is noted. Left lung is clear. Right-sided PICC line is noted with distal tip in expected position of SVC. Mild right basilar opacity is noted concerning for atelectasis or infiltrate with associated pleural effusion. Bony thorax is unremarkable. IMPRESSION: Interval development of mild right basilar opacity concerning for atelectasis or infiltrate with possible associated pleural effusion. Interval placement of right-sided PICC line with distal tip in expected position of SVC. Electronically Signed   By: Marijo Conception, M.D.   On: 01/11/2018 15:27   [place] Ekg Site Rite  Result Date: 01/11/2018 If Site Rite image not attached, placement could not be confirmed due to current cardiac rhythm.    Assessment/Plan: 1.  AKI- nonoliguric, in setting of sepsis, hypotension, volume depletion and concomitant ACE inhibition.  Most consistent with ischemic ATN.  Electrolytes stable as well as volume status.  No indication for HD or CVVHD at this time and will continue to follow closely.  Agree  with foley catheter placement.  CT scan without evidence of obstruction or stones. 2. Sepsis- blood cultures positive for strep constellatus.  Continue with zosyn and await sensitivities.  Continue with IVF's and add pressors as needed 3. Liver mass/abscess- s/p aspiration with gram positive cocci in pairs, as above 4. Acute hypoxic Respiratory distress with wheezing- repeat cxr appears to have new infiltrate consistent  with possible aspiration pneumonia- abx per PCCM. 5. Anemia of critical illness vs ABLA- following aspiration.  Follow H/H and transfuse prn 6. Thrombocytopenia- likely due to DIC.  Follow and avoid heparin. 7. Metabolic acidosis due to #1.  May need isotonic bicarb 8. Hypokalemia- replete gently given AKI 9. Hyponatremia due to #1 and respiratory distress.  Continue to follow.    Governor Rooks Cianni Manny 01/11/2018, 8:35 PM

## 2018-01-11 NOTE — Care Management Note (Signed)
Case Management Note  Patient Details  Name: Lisa Bauer MRN: [id number] Date of Birth: [birth year]  Subjective/Objective:   From home with spouse, presents with hepatic abscess, acute renal failure, severe sepsis with septic shock, bacteremia due to dtreptococcus, elevated lft's, hypokalemia, metabolic acidosis, anemi, hypophosphatemia.  She is s/p  US guided bx of liver and catheter drainage.                 Action/Plan: NCM will follow for transition of care needs.   Expected Discharge Date:                  Expected Discharge Plan:  Home/Self Care  In-House Referral:     Discharge planning Services  CM Consult  Post Acute Care Choice:    Choice offered to:     DME Arranged:    DME Agency:     HH Arranged:    HH Agency:     Status of Service:  In process, will continue to follow  If discussed at Long Length of Stay Meetings, dates discussed:    Additional Comments:  Zenon Mayo, RN 01/11/2018, 4:25 PM

## 2018-01-11 NOTE — Progress Notes (Signed)
Report to Nate RN accompanying pt to IR.

## 2018-01-11 NOTE — Progress Notes (Signed)
Peripherally Inserted Central Catheter/Midline Placement  The IV Nurse has discussed with the patient and/or persons authorized to consent for the patient, the purpose of this procedure and the potential benefits and risks involved with this procedure.  The benefits include less needle sticks, lab draws from the catheter, and the patient may be discharged home with the catheter. Risks include, but not limited to, infection, bleeding, blood clot (thrombus formation), and puncture of an artery; nerve damage and irregular heartbeat and possibility to perform a PICC exchange if needed/ordered by physician.  Alternatives to this procedure were also discussed.  Bard Power PICC patient education guide, fact sheet on infection prevention and patient information card has been provided to patient /or left at bedside.    PICC/Midline Placement Documentation        Darlyn Read 01/11/2018, 9:25 AM

## 2018-01-11 NOTE — Procedures (Signed)
Interventional Radiology Procedure Note  Procedure: US guided biopsy of liver and catheter drainage   Complications: None  Estimated Blood Loss: < 10 mL  Findings: Large, heterogeneous necrotic mass vs abscess of liver.  80 G core biopsy performed of echogenic tissue along rim of liquefied area.  Aspiration of liquefied region yielded bloody, turbid fluid.  Samples sent for culture and cytology.  10 Fr drain placed in liquefied region and placed to suction bulb drainage.  Venetia Night. Kathlene Cote, M.D Pager:  [phone]

## 2018-01-11 NOTE — Progress Notes (Signed)
Report given to 5[facility] 08 RN. IR called and informed that pt to be tx to [facility] 08. Pt's son who's been at bs also informed of tx and location.

## 2018-01-11 NOTE — Progress Notes (Signed)
Lisa Bauer is a 62 y.o. female patient admitted from ED awake, alert - oriented  X 4 - no acute distress noted.  VSS - Blood pressure (!) 163/98, pulse 91, temperature 100.2 F (37.9 C), temperature source Oral, resp. rate (!) 24, height 6' (1.829 m), weight 93.9 kg, SpO2 (!) 84 %.    IV in place, occlusive dsg intact without redness.  Orientation to room, and floor completed with information packet given to patient/family.  Patient declined safety video at this time.  Admission INP armband ID verified with patient/family, and in place.   SR up x 2, fall assessment complete, with patient and family able to verbalize understanding of risk associated with falls, and verbalized understanding to call nsg before up out of bed.  Call light within reach, patient able to voice, and demonstrate understanding.  Skin, clean-dry- intact without evidence of bruising, or skin tears.   No evidence of skin break down noted on exam.     Will cont to eval and treat per MD orders.  Holley Raring, RN 01/11/2018 3:18 PM

## 2018-01-11 NOTE — Progress Notes (Signed)
PULMONARY / CRITICAL CARE MEDICINE   Name: Lisa Bauer MRN: [id number] DOB: [birth year]    ADMISSION DATE:  01/09/2018 CONSULTATION DATE: 01/11/18  REFERRING MD: Dr Karleen Hampshire  CHIEF COMPLAINT: Shock, Hypoxia  HISTORY OF PRESENT ILLNESS:   62yoF with hx HTN, Uterine Leiomyoma, and Sciatica, developed abd pain, fatigue, dysuria, dark urine, and N/V on 8/4, saw her PCP on 8/6 and was diagnosed with a UTI. She was prescribed Macrobid. She then presented to the ER on 8/7 with worsening epigastric abdominal pain, N/V, Fever. In the ER she was found to have septic shock, transaminitis, and AKI. CT Abdomen revealed liver abscess vs mass. IR consulted for drainage but favored waiting and obtaining an MRCP first. MRCP showed 10.2x10.5x9.4cm liver mass with a second smaller 2.7cm liver mass. She was taken for IR for CT guided biopsy of the liver mass/abscess. Aspiration revealed bloody turbid fluid, so a drain was left in place to decompress. During the procedure, patient received 178mcg Fentanyl and 1.5mg  Versed. Following the procedure patient found to be hypotensive to 63/52  And hypoxic to 78% on 2L (had previously been 96% on 2L). PCCM reconsulted. At time of my exam, patient says she "feels pretty good." Reports only mild abdominal pain at drain site at rest, worse with movement. Denies SOB or Cough but says she has pleuritic CP.   PAST MEDICAL HISTORY :  She  has a past medical history of Hypertension.  PAST SURGICAL HISTORY: She  has no past surgical history on file.  No Known Allergies  No current facility-administered medications on file prior to encounter.    Current Outpatient Medications on File Prior to Encounter  Medication Sig  . cholecalciferol (VITAMIN D) 1000 units tablet Take 1,000 Units by mouth daily.  . fluconazole (DIFLUCAN) 150 MG tablet Take 150 mg by mouth See admin instructions. Take 1 tablet on 01/07/18. Repeat dose in 3 days (01/10/18)  . lisinopril-hydrochlorothiazide  (PRINZIDE,ZESTORETIC) 20-25 MG tablet Take 1 tablet by mouth at bedtime.   . nitrofurantoin, macrocrystal-monohydrate, (MACROBID) 100 MG capsule Take 100 mg by mouth 2 (two) times daily.  . ondansetron (ZOFRAN-ODT) 8 MG disintegrating tablet Take 8 mg by mouth every 8 (eight) hours as needed for nausea or vomiting.  . phenazopyridine (PYRIDIUM) 200 MG tablet Take 200 mg by mouth 3 (three) times daily as needed for pain.  . TURMERIC PO Take 2 capsules by mouth at bedtime.   FAMILY HISTORY:  Her family history is not on file.  SOCIAL HISTORY: She  reports that she has never smoked. She has never used smokeless tobacco. She reports that she does not drink alcohol or use drugs.  REVIEW OF SYSTEMS:   Review of Systems  Constitutional: Positive for chills, fever and malaise/fatigue.  HENT: Negative.   Eyes: Negative.   Respiratory: Negative for cough, sputum production and wheezing.        SOB with exertion  Cardiovascular: Positive for chest pain.       Pleuritic CP only  Gastrointestinal: Positive for abdominal pain. Negative for diarrhea, nausea and vomiting.  Genitourinary: Negative.   Musculoskeletal: Negative.   Skin: Negative.   Neurological: Negative.   Endo/Heme/Allergies: Negative.   Psychiatric/Behavioral: Negative.    SUBJECTIVE:  Lying on hospital bed wearing 6L O2 in NAD  VITAL SIGNS: BP (!) 76/60   Pulse 73   Temp 97.7 F (36.5 C) (Oral)   Resp (!) 21   Ht 6' (1.829 m)   Wt 93.9 kg   LMP  (  LMP Unknown)   SpO2 100%   BMI 28.08 kg/m   HEMODYNAMICS:  94/64 on no vasopressors   INTAKE / OUTPUT: I/O last 3 completed shifts: In: 3532 [P.O.:280; I.V.:3381.8; Blood:672; Other:50; IV Piggyback:1273.1] Out: 9924 [Urine:1250; Drains:160; Stool:400]  PHYSICAL EXAMINATION: General: WDWN Adult female, in NAD Neuro: AAOx3, moving all extremities, obeying commands HEENT: OP clear, MM moist  Cardiovascular: Tachycardic to 118 with a regular rhythm, no m/r/g Lungs:  CTA b/l, speaking in full sentences with no accessory muscle use. RR charted as 26 but actually is 16 on my exam Abdomen: Soft Mildly TTP in RUQ, no guarding/rebound, BS hypoactive Musculoskeletal: no LE edema Skin: no rashes  GU: external urinary catheter in place   LABS:  BMET Recent Labs  Lab 01/09/18 2011 01/10/18 0228 01/11/18 0356  NA 132* 134* 133*  K 3.5 2.8* 3.4*  CL 95* 99 101  CO2 22 21* 17*  BUN 39* 45* 63*  CREATININE 2.43* 3.01* 3.72*  GLUCOSE 101* 86 110*   Electrolytes Recent Labs  Lab 01/09/18 1443 01/09/18 2011 01/10/18 0228 01/11/18 0356  CALCIUM  --  7.0* 6.8* 6.7*  MG 1.6*  --  1.6* 2.4  PHOS  --   --  2.4* 3.8   CBC Recent Labs  Lab 01/09/18 1655 01/11/18 0356 01/11/18 1829  WBC 12.0* 14.9* 18.3*  HGB 10.4* 10.6* 9.3*  HCT 31.5* 31.3* 27.2*  PLT 58* 54* 59*   Coag's Recent Labs  Lab 01/09/18 1151  INR 1.24   Sepsis Markers Recent Labs  Lab 01/09/18 1202 01/09/18 1329 01/10/18 0228  LATICACIDVEN 3.05* 2.70* 2.0*   ABG No results for input(s): PHART, PCO2ART, PO2ART in the last 168 hours.  Liver Enzymes Recent Labs  Lab 01/09/18 1151 01/11/18 1105  AST 437* 273*  ALT 464* 319*  ALKPHOS 86 70  BILITOT 3.3* 2.4*  ALBUMIN 2.9* 2.2*   Cardiac Enzymes No results for input(s): TROPONINI, PROBNP in the last 168 hours.  Glucose Recent Labs  Lab 01/09/18 1640 01/10/18 0843 01/10/18 1624  GLUCAP 103* 95 125*   Imaging US Guided Needle Placement  Result Date: 01/11/2018 INDICATION: Heterogeneous mass versus abscess of the liver with liquefied component. Presentation with abdominal pain and sepsis with positive blood cultures. EXAM: 1. ULTRASOUND-GUIDED CORE BIOPSY LIVER 2. ULTRASOUND GUIDED PERCUTANEOUS CATHETER DRAINAGE OF LIVER MEDICATIONS: The patient is currently admitted to the hospital and receiving intravenous antibiotics. The antibiotics were administered within an appropriate time frame prior to the initiation of  the procedure. ANESTHESIA/SEDATION: Fentanyl 75 mcg IV; Versed 1.5 mg IV Moderate Sedation Time:  31 minutes. The patient was continuously monitored during the procedure by the interventional radiology nurse under my direct supervision. COMPLICATIONS: None immediate. PROCEDURE: Informed written consent was obtained from the patient after a thorough discussion of the procedural risks, benefits and alternatives. All questions were addressed. Maximal Sterile Barrier Technique was utilized including caps, mask, sterile gowns, sterile gloves, sterile drape, hand hygiene and skin antiseptic. A timeout was performed prior to the initiation of the procedure. Ultrasound was performed of the liver. Under ultrasound guidance, a 17 gauge needle was advanced into the right lobe. 18 gauge core biopsy samples were obtained with 3 samples submitted in formalin. The 17 gauge needle was then advanced into a liquified component. Aspiration of fluid was performed and sample sent for both culture analysis and cytology. A guidewire was advanced through the needle. The tract was dilated and a 10 French percutaneous drainage catheter advanced. Catheter position was  confirmed by ultrasound. The catheter was connected to a suction bulb. The drainage catheter was secured at the skin with a Prolene retention suture and adhesive StatLock device. FINDINGS: Large heterogeneous abnormality of the liver is present with ill-defined borders. The area of parenchymal abnormality spans at least an area 11 cm. The echogenic rim surrounding a liquified component was sampled with tissue biopsy. Aspiration at the level of the liquefied component yielded bloody and turbid fluid. A 10 French drain was placed in this collection which is draining bloody fluid. IMPRESSION: Ultrasound-guided biopsy of the liver lesion along the edge of the liquefied component followed by aspiration of the liquefied component yielding bloody and turbid fluid which was sent for  culture analysis and cytology. Additional placement of a 10 French percutaneous drainage catheter into the liquefied component which was attached to suction bulb drainage. Electronically Signed   By: Aletta Edouard M.D.   On: 01/11/2018 17:15   US Biopsy (liver)  Result Date: 01/11/2018 INDICATION: Heterogeneous mass versus abscess of the liver with liquefied component. Presentation with abdominal pain and sepsis with positive blood cultures. EXAM: 1. ULTRASOUND-GUIDED CORE BIOPSY LIVER 2. ULTRASOUND GUIDED PERCUTANEOUS CATHETER DRAINAGE OF LIVER MEDICATIONS: The patient is currently admitted to the hospital and receiving intravenous antibiotics. The antibiotics were administered within an appropriate time frame prior to the initiation of the procedure. ANESTHESIA/SEDATION: Fentanyl 75 mcg IV; Versed 1.5 mg IV Moderate Sedation Time:  31 minutes. The patient was continuously monitored during the procedure by the interventional radiology nurse under my direct supervision. COMPLICATIONS: None immediate. PROCEDURE: Informed written consent was obtained from the patient after a thorough discussion of the procedural risks, benefits and alternatives. All questions were addressed. Maximal Sterile Barrier Technique was utilized including caps, mask, sterile gowns, sterile gloves, sterile drape, hand hygiene and skin antiseptic. A timeout was performed prior to the initiation of the procedure. Ultrasound was performed of the liver. Under ultrasound guidance, a 17 gauge needle was advanced into the right lobe. 18 gauge core biopsy samples were obtained with 3 samples submitted in formalin. The 17 gauge needle was then advanced into a liquified component. Aspiration of fluid was performed and sample sent for both culture analysis and cytology. A guidewire was advanced through the needle. The tract was dilated and a 10 French percutaneous drainage catheter advanced. Catheter position was confirmed by ultrasound. The  catheter was connected to a suction bulb. The drainage catheter was secured at the skin with a Prolene retention suture and adhesive StatLock device. FINDINGS: Large heterogeneous abnormality of the liver is present with ill-defined borders. The area of parenchymal abnormality spans at least an area 11 cm. The echogenic rim surrounding a liquified component was sampled with tissue biopsy. Aspiration at the level of the liquefied component yielded bloody and turbid fluid. A 10 French drain was placed in this collection which is draining bloody fluid. IMPRESSION: Ultrasound-guided biopsy of the liver lesion along the edge of the liquefied component followed by aspiration of the liquefied component yielding bloody and turbid fluid which was sent for culture analysis and cytology. Additional placement of a 10 French percutaneous drainage catheter into the liquefied component which was attached to suction bulb drainage. Electronically Signed   By: Aletta Edouard M.D.   On: 01/11/2018 17:15   Dg Chest Port 1 View  Result Date: 01/11/2018 CLINICAL DATA:  Dyspnea. EXAM: PORTABLE CHEST 1 VIEW COMPARISON:  Radiograph of January 09, 2018. FINDINGS: Stable cardiomediastinal silhouette. No pneumothorax is noted. Left lung is  clear. Right-sided PICC line is noted with distal tip in expected position of SVC. Mild right basilar opacity is noted concerning for atelectasis or infiltrate with associated pleural effusion. Bony thorax is unremarkable. IMPRESSION: Interval development of mild right basilar opacity concerning for atelectasis or infiltrate with possible associated pleural effusion. Interval placement of right-sided PICC line with distal tip in expected position of SVC. Electronically Signed   By: Marijo Conception, M.D.   On: 01/11/2018 15:27   [place] Ekg Site Rite  Result Date: 01/11/2018 If Site Rite image not attached, placement could not be confirmed due to current cardiac rhythm.  STUDIES:  CXR (8/9): Interval  development of mild right basilar opacity concerning for atelectasis or infiltrate with possible associated pleural effusion.  US guided liver biopsy (8/9): Ultrasound-guided biopsy of the liver lesion along the edge of the liquefied component followed by aspiration of the liquefied component yielding bloody and turbid fluid which was sent for culture analysis and cytology. Additional placement of a 10 French percutaneous drainage catheter into the liquefied component which was attached to suction bulb drainage.  MRCP (8/7): 1. Examination confirms presence of large heterogeneous mass involving almost the entire segment 4 of the liver. A smaller lesion is identified within the lateral aspect of segment 7. Imaging findings are compatible with either primary neoplasm of the liver such as cholangiocarcinoma or metastatic disease. The dominant mass should be easily amendable to image guided percutaneous biopsy. Additionally, consider further investigation with PET-CT to assess for distant metastatic disease. 2. Small pleural effusions.  CT Abdomen w/o contrast (8/7): Heterogeneously attenuating mass within the liver, predominantly segment 4 measuring 9.5 cm by 8.7 cm. Differential includes abscess, primary liver tumor/biliary tumor, or metastases. Additional small pathologic focus within the lateral aspect of the right liver measuring approximately 2.6 cm. Bilateral kidneys without hydronephrosis or nephrolithiasis. Unremarkable course the bilateral ureters. Urinary bladder is relatively decompressed.   CULTURES: Blood cultures (8/7): 2/4 bottles strep MRSA PCR (8/7): negative Liver abscess culture (8/9): pending  UA (8/9): pending Sputum culture (8/9): pending   ANTIBIOTICS: Flagyl 8/7>>8/8 Vanc 8/7 Zosyn 8/7>>  SIGNIFICANT EVENTS: 8/6: presented to PCP with abd pain, N/V, F/C, Dysuria, dark urine >> diagnosed with UTI, started on Macrobid and Pyridium 8/7: presented to ER with worsening abd  pain, F/C, N/V >> dx with septic shock, AKI, transaminitis, and liver abscess vs mass 8/8 AM: weaned off vasopressors 8/9: taken for liver abscess/mass aspiration; drain left in place >> returns to unit and has worsened hypoxia and hypotension; PCCM reconsulted  LINES/TUBES: PICC 01/11/18>> External urinary catheter   DISCUSSION: 62yoF with hx HTN, Uterine Leiomyoma, and Sciatica, developed abd pain, fatigue, dysuria, dark urine, and N/V on 8/4, saw her PCP on 8/6 and was diagnosed with a UTI. She was prescribed Macrobid. She then presented to the ER on 8/7 with worsening epigastric abdominal pain, N/V, Fever. In the ER she was found to have septic shock, transaminitis, and AKI. CT Abdomen revealed liver abscess vs mass. IR consulted for drainage but favored waiting and obtaining an MRCP first. MRCP showed 10.2x10.5x9.4cm liver mass with a second smaller 2.7cm liver mass. She was taken for IR for CT guided biopsy of the liver mass/abscess. Aspiration revealed bloody turbid fluid, so a drain was left in place to decompress. During the procedure, patient received 184mcg Fentanyl and 1.5mg  Versed. Following the procedure patient found to be hypotensive to 63/52  And hypoxic to 78% on 2L (had previously been 96% on 2L). PCCM  reconsulted. At time of my exam, patient says she "feels pretty good." Reports only mild abdominal pain at drain site at rest, worse with movement. Denies SOB or Cough but says she has pleuritic CP.   ASSESSMENT / PLAN:  PULMONARY: 1. Acute Hypoxic Respiratory failure; Aspiration pneumonia: - was previously requiring only 2L now on 6L O2 - CXR on my review shows a new RLL infiltrate that was not present previously; assume she aspirated during conscious sedation for her procedure today - obtain sputum culture; wean O2 as tolerated. Continue antibiotics as below. - NPO overnight; re-eval in AM  CARDIOVASCULAR 1. Shock: likely related to sedation given during procedure; improving now  following 2L IVF bolus. See further workup below.   RENAL 1. AKI; UTI: - creatinine 3.72 up from 3.01 yesterday and from 2.43 on admission, from a baseline of 0.89 on 8/15.  - likely AKI due to ATN in setting of shock, poor PO intake for several days, and taking an ACE-inhibitor - CT Abdomen shows no hydronephrosis or pyelo - UA from PCP office consistent with UTI; no UA or Urine lytes have been obtained here.  - Place foley catheter; obtain UA and Urine lytes now - No note by [facility] so I called Dr Marval Regal who says he DID see the patient earlier today but hasn't written a note yet.  - plan for UTI discussed below.   GASTROINTESTINAL 1. Liver mass vs abscess; Transaminitis: - presenting clinical picture more consistent with sepsis which would make it more likely this is a liver abscess; however on MRCP it appears more likely a liver mass. Aspiration today of bloody turbid fluid may be from necrotic mass vs abscess. Drain in place. Awaiting cytology and culture results - LFT's remain high but are improving; continue to trend daily.   HEMATOLOGIC 1. Anemia; Thrombocytopenia: - Hgb 12.3 on admission, down from baseline of 13.3 on 01/07/18, now further declined to 10.6 this AM and now 9.3 post procedure. No clinically obvious active bleeding. Continue to monitor - Platelets 93 on admission, down from baseline of 145 on 01/07/18, decreased further to a low of 54 this AM, now up to 59. Likely related to sepsis; continue to monitor. If drops further consider peripheral smear.   INFECTIOUS 1. Septic shock: due to UTI, Liver abscess, Strep bacteremia, and now Aspiration pneumonia - had been in septic shock on admission 8/7 but weaned off pressors by 8/8; now with worsening shock s/p liver abscess/mass aspiration. Most likely iatrogenic hypotension due to the sedation she received in the procedure. However cannot rule out worsening shock, especially given the new RLL infiltrate. - lactate 2.7, up from  2.0 previously; continue to trend - check procalcitonin - obtain UA/Urine culture, Sputum culture, and repeat blood cultures to see if the Strep bacteremia from 8/7 cleared. If new blood culture shows that bacteremia has not cleared, she will need a TTE to eval for endocarditis - s/p 2L IVF bolus with SBP now in low to mid 90's (MAP 69-74); continue NS @ 100cc/hr - if MAP drops below 65, will start vasopressors and transfer to ICU. For now with BP improving, she is ok to remain in step-down - check cortisol - continue Zosyn; no need for Vanc as MRSA PCR of nares was negative  ENDOCRINE No active issues  NEUROLOGIC No active issues   FAMILY  - Updates: no family present at bedside at time of my exam - Inter-disciplinary family meet or Palliative Care meeting due by: 01/17/18  60 minutes critical care time  Vernie Murders, MD  Pulmonary and [place] Pager: [phone]  01/11/2018, 7:54 PM

## 2018-01-11 NOTE — Progress Notes (Addendum)
PROGRESS NOTE    Lisa Bauer  WUJ:[id number] DOB: Jan 31, 1956 DOA: 01/09/2018 PCP: [facility]   Brief Narrative:  62 year old lady with prior h/o  comes in for fever, abdominal pain, was found to have hypotension and elevated lactic acid. Further evaluation with a CT abdomen shows heterogenous liver mass.  She was initially admitted by [facility] for septic shock and briefly required pressors.  She is off the pressors and transferred to [facility] service on 8/9.     Assessment & Plan:   Active Problems:   Hepatic abscess   ARF (acute renal failure) (HCC)   Severe sepsis with septic shock (HCC)   Bacteremia due to Streptococcus   Elevated liver function tests   Hypokalemia   Hypomagnesemia   Metabolic acidosis   Anemia   Hypophosphatemia   Septic shock briefly requiring pressors and ICU Improving.  BP parameters are improving.  Off the pressors.  Blood cultures showing streptococcus. Further identification and sensitivities are pending.  Trend lactic acid.  Currently pt is on zosyn for the liver abscess, continue the same.     Hypotension:  Resolved.     Acute kidney injury with some metabolic acidosis:  Differential include ATN from sepsis vs pre renal from hypotension and hypovolemia.  Worsening despite IV fluids and urine output adequate.  CT abd and MRI of the abdomen does not show any hydronephrosis.  UA, urine electrolytes ordered to check FeNa.  Nephrology consulted for evaluation.   Hypokalemia: replaced, recheck in am.    Hypomagnesemia and hypophosphatemia Replaced.    Anemia: Normocytic , unclear etiology.  Get stool for occult blood and anemia panel to check iron studies.    Thrombocytopenia:  Unclear etiology. Differential include liver dysfunction vs sepsis.  Transfuse 2 units of platelets today prior to liver biopsy by IR.  Recheck cbc in am.    Heterogenous liver mass:  Differential include  Primary liver neoplasm vs  liver abscess vs cholangiocarcinoma.  Plan for liver biopsy today. Her CEA and alpha fetoprotein wnl.  Recheck liver function panel today.  GI consulted, Dr Michail Sermon suggested outpatient follow up with Dr Therisa Doyne for a colonoscopy when the liver issues improve.       DVT prophylaxis: scd's Code Status: full code.  Family Communication: none at bedside.  Disposition Plan: pending clinical improvement and liver biopsy.    Consultants:  Nephrology Dr Marval Regal PCCM since admission.  IR  Procedures:  IR guided liver biopsy and possible drain placement if purulent fluid is aspirated  Antimicrobials:zosyn for possible liver abscess.    Subjective: NO chest pain or sob, no nausea, vomiting  Objective: Vitals:   01/11/18 0500 01/11/18 0600 01/11/18 0700 01/11/18 0757  BP: 105/78 117/79 133/89   Pulse: 71 72 66   Resp: (!) 23 18 (!) 21   Temp:    98 F (36.7 C)  TempSrc:    Oral  SpO2: 99% 100% 100%   Weight: 93.9 kg     Height:        Intake/Output Summary (Last 24 hours) at 01/11/2018 0902 Last data filed at 01/11/2018 0600 Gross per 24 hour  Intake 4134.39 ml  Output 1100 ml  Net 3034.39 ml   Filed Weights   01/09/18 1600 01/10/18 0458 01/11/18 0500  Weight: 84.4 kg 84.4 kg 93.9 kg    Examination:  General exam: Appears calm and comfortable  Respiratory system: Clear to auscultation. Respiratory effort normal. Cardiovascular system: S1 & S2 heard,  RRR. No JVD, murmurs, rubs, gallops or clicks. No pedal edema. Gastrointestinal system: Abdomen is nondistended, soft and nontender. No organomegaly or masses felt. Normal bowel sounds heard. Central nervous system: Alert and oriented. No focal neurological deficits. Extremities: Symmetric 5 x 5 power. Skin: No rashes, lesions or ulcers Psychiatry:  Mood & affect appropriate.     Data Reviewed: I have personally reviewed following labs and imaging studies  CBC: Recent Labs  Lab 01/09/18 1151 01/09/18 1655  01/11/18 0356  WBC 9.9 12.0* 14.9*  NEUTROABS 8.4*  --   --   HGB 12.3 10.4* 10.6*  HCT 36.6 31.5* 31.3*  MCV 87.6 89.0 84.8  PLT 93* 58* 54*   Basic Metabolic Panel: Recent Labs  Lab 01/09/18 1151 01/09/18 1443 01/09/18 1655 01/09/18 2011 01/10/18 0228 01/11/18 0356  NA 127*  --   --  132* 134* 133*  K 2.8*  --   --  3.5 2.8* 3.4*  CL 87*  --   --  95* 99 101  CO2 25  --   --  22 21* 17*  GLUCOSE 116*  --   --  101* 86 110*  BUN 38*  --   --  39* 45* 63*  CREATININE 2.48*  --  2.32* 2.43* 3.01* 3.72*  CALCIUM 7.6*  --   --  7.0* 6.8* 6.7*  MG  --  1.6*  --   --  1.6* 2.4  PHOS  --   --   --   --  2.4* 3.8   GFR: Estimated Creatinine Clearance: 20.1 mL/min (A) (by C-G formula based on SCr of 3.72 mg/dL (H)). Liver Function Tests: Recent Labs  Lab 01/09/18 1151  AST 437*  ALT 464*  ALKPHOS 86  BILITOT 3.3*  PROT 6.1*  ALBUMIN 2.9*   Recent Labs  Lab 01/09/18 1151  LIPASE 24   No results for input(s): AMMONIA in the last 168 hours. Coagulation Profile: Recent Labs  Lab 01/09/18 1151  INR 1.24   Cardiac Enzymes: No results for input(s): CKTOTAL, CKMB, CKMBINDEX, TROPONINI in the last 168 hours. BNP (last 3 results) No results for input(s): PROBNP in the last 8760 hours. HbA1C: No results for input(s): HGBA1C in the last 72 hours. CBG: Recent Labs  Lab 01/09/18 1640 01/10/18 0843 01/10/18 1624  GLUCAP 103* 95 125*   Lipid Profile: No results for input(s): CHOL, HDL, LDLCALC, TRIG, CHOLHDL, LDLDIRECT in the last 72 hours. Thyroid Function Tests: No results for input(s): TSH, T4TOTAL, FREET4, T3FREE, THYROIDAB in the last 72 hours. Anemia Panel: No results for input(s): VITAMINB12, FOLATE, FERRITIN, TIBC, IRON, RETICCTPCT in the last 72 hours. Sepsis Labs: Recent Labs  Lab 01/09/18 1202 01/09/18 1329 01/10/18 0228  LATICACIDVEN 3.05* 2.70* 2.0*    Recent Results (from the past 240 hour(s))  Blood Culture (routine x 2)     Status: None  (Preliminary result)   Collection Time: 01/09/18 11:40 AM  Result Value Ref Range Status   Specimen Description BLOOD RIGHT ANTECUBITAL  Final   Special Requests   Final    BOTTLES DRAWN AEROBIC AND ANAEROBIC Blood Culture adequate volume   Culture  Setup Time   Final    GRAM POSITIVE COCCI IN CHAINS ANAEROBIC BOTTLE ONLY CRITICAL RESULT CALLED TO, READ BACK BY AND VERIFIED WITH: Laurice Record PharmD 16:10 01/10/18 ([email or web address]) Performed at [facility], [place] [address], [place], [place] [address]    Culture GRAM POSITIVE COCCI  Final   Report Status PENDING  Incomplete  Blood Culture ID Panel (Reflexed)     Status: Abnormal   Collection Time: 01/09/18 11:40 AM  Result Value Ref Range Status   Enterococcus species NOT DETECTED NOT DETECTED Final   Listeria monocytogenes NOT DETECTED NOT DETECTED Final   Staphylococcus species NOT DETECTED NOT DETECTED Final   Staphylococcus aureus NOT DETECTED NOT DETECTED Final   Streptococcus species DETECTED (A) NOT DETECTED Final    Comment: Not Enterococcus species, Streptococcus agalactiae, Streptococcus pyogenes, or Streptococcus pneumoniae. CRITICAL RESULT CALLED TO, READ BACK BY AND VERIFIED WITH: Laurice Record PharmD 16:10 01/10/18 ([email or web address])    Streptococcus agalactiae NOT DETECTED NOT DETECTED Final   Streptococcus pneumoniae NOT DETECTED NOT DETECTED Final   Streptococcus pyogenes NOT DETECTED NOT DETECTED Final   Acinetobacter baumannii NOT DETECTED NOT DETECTED Final   Enterobacteriaceae species NOT DETECTED NOT DETECTED Final   Enterobacter cloacae complex NOT DETECTED NOT DETECTED Final   Escherichia coli NOT DETECTED NOT DETECTED Final   Klebsiella oxytoca NOT DETECTED NOT DETECTED Final   Klebsiella pneumoniae NOT DETECTED NOT DETECTED Final   Proteus species NOT DETECTED NOT DETECTED Final   Serratia marcescens NOT DETECTED NOT DETECTED Final   Haemophilus influenzae NOT DETECTED NOT DETECTED Final   Neisseria meningitidis NOT DETECTED  NOT DETECTED Final   Pseudomonas aeruginosa NOT DETECTED NOT DETECTED Final   Candida albicans NOT DETECTED NOT DETECTED Final   Candida glabrata NOT DETECTED NOT DETECTED Final   Candida krusei NOT DETECTED NOT DETECTED Final   Candida parapsilosis NOT DETECTED NOT DETECTED Final   Candida tropicalis NOT DETECTED NOT DETECTED Final    Comment: Performed at [facility], [place] [address], [place], [place] [address]  Blood Culture (routine x 2)     Status: None (Preliminary result)   Collection Time: 01/09/18 11:50 AM  Result Value Ref Range Status   Specimen Description BLOOD LEFT ANTECUBITAL  Final   Special Requests   Final    BOTTLES DRAWN AEROBIC AND ANAEROBIC Blood Culture adequate volume   Culture   Final    NO GROWTH 1 DAY Performed at [facility], [place] [address] [place], [place] [address]    Report Status PENDING  Incomplete  MRSA PCR Screening     Status: None   Collection Time: 01/09/18  4:31 PM  Result Value Ref Range Status   MRSA by PCR NEGATIVE NEGATIVE Final    Comment:        The GeneXpert MRSA Assay (FDA approved for NASAL specimens only), is one component of a comprehensive MRSA colonization surveillance program. It is not intended to diagnose MRSA infection nor to guide or monitor treatment for MRSA infections. Performed at [facility], [place] [address], [place], [place] [address]          Radiology Studies: Ct Abdomen Pelvis Wo Contrast  Result Date: 01/09/2018 CLINICAL DATA:  62 year old female with a history of abdominal pain with nausea and vomiting EXAM: CT ABDOMEN AND PELVIS WITHOUT CONTRAST TECHNIQUE: Multidetector CT imaging of the abdomen and pelvis was performed following the standard protocol without IV contrast. COMPARISON:  09/24/2016 FINDINGS: Lower chest: No pleural effusion or confluent airspace disease. Hepatobiliary: Heterogeneously attenuating mass of the liver, prominently within segment 4, though extending into the  right liver, segment 2 and segment 3. Estimated diameter on the axial images 9.5 cm x 8.7 cm. Additional nodule/mass within the periphery of the right liver on image 19. Gallbladder is relatively decompressed.  No radiopaque  gallstones. Pancreas: Unremarkable appearance of pancreas Spleen: Unremarkable spleen Adrenals/Urinary Tract: Unremarkable adrenal glands. Bilateral kidneys without hydronephrosis or nephrolithiasis. Unremarkable course the bilateral ureters. Urinary bladder is relatively decompressed. Stomach/Bowel: Stomach unremarkable. No abnormally distended small bowel. No transition point. No focal inflammatory changes of the small bowel mesentery. Normal appendix. Colon is decompressed. Vascular/Lymphatic: No significant vascular calcifications. No mesenteric, retroperitoneal, or inguinal adenopathy. Reproductive: Fibroid changes of the uterus. Other: No large ventral wall hernia. Musculoskeletal: Grade 1 anterolisthesis of L4 on L5. Vacuum disc phenomenon at L4-L5 and L5-S1. No bony canal narrowing. Degenerative changes of the facets. Mild degenerative changes of the hips. No acute fracture IMPRESSION: Heterogeneously attenuating mass within the liver, predominantly segment 4 measuring 9.5 cm by 8.7 cm. Differential includes abscess, primary liver tumor/biliary tumor, or metastases. Additional small pathologic focus within the lateral aspect of the right liver measuring approximately 2.6 cm. These results were called by telephone at the time of interpretation on 01/09/2018 at 2:25 pm to Dr. Lajean Saver , who verbally acknowledged these results. Fibroid changes of the uterus. Electronically Signed   By: Corrie Mckusick D.O.   On: 01/09/2018 14:28   Dg Chest 2 View  Result Date: 01/09/2018 CLINICAL DATA:  Fever, weakness and dizziness over the last 3 days. EXAM: CHEST - 2 VIEW COMPARISON:  09/23/2017 FINDINGS: Heart and mediastinal shadows are normal. Lung volumes are diminished which could be due to a  poor inspiration for underlying lung disease. Lung markings appear more prominent diffusely, which again could relate to the poor inspiration or could indicate mild diffuse pneumonitis. No dense consolidation or lobar collapse. No effusions. No significant bone finding. IMPRESSION: Poor inspiration versus widespread pneumonitis. No dense consolidation or collapse. Electronically Signed   By: Nelson Chimes M.D.   On: 01/09/2018 11:29   Mr Abdomen Mrcp Wo Contrast  Result Date: 01/10/2018 CLINICAL DATA:  Abdominal pain. 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 EXAM: MRI ABDOMEN WITHOUT CONTRAST  (INCLUDING MRCP) TECHNIQUE: Multiplanar multisequence MR imaging of the abdomen was performed. Heavily T2-weighted images of the biliary and pancreatic ducts were obtained, and three-dimensional MRCP images were rendered by post processing. COMPARISON:  01/09/2018 FINDINGS: Lower chest: Small bilateral pleural effusions identified. Hepatobiliary: There is a large heterogeneous mass involving much of the entirety of segment 4. The mass measures 10.2 by 10.5 by 9.4 cm, image 14/6. A second smaller lesion is identified within the periphery of segment 7 measuring 2.7 cm, image 29/2. The common bile duct has a normal caliber. Pericholecystic fluid is identified. No gallstones identified. Pancreas: Within the limitations of unenhanced technique no pancreas abnormality identified. No main duct dilatation or mass. Spleen:  Normal appearance of the spleen. Adrenals/Urinary Tract: Normal appearance of the adrenal glands. No kidney mass or hydronephrosis identified. Stomach/Bowel: The stomach is normal. The visualized bowel loops within the upper abdomen are unremarkable. Vascular/Lymphatic: Normal appearance of the abdominal aorta. No retroperitoneal adenopathy. Other: There is a small amount of free fluid identified within the upper abdomen. Musculoskeletal: No suspicious bone lesions identified. IMPRESSION: 1. Examination confirms  presence of large heterogeneous mass involving almost the entire segment 4 of the liver. A smaller lesion is identified within the lateral aspect of segment 7. Imaging findings are compatible with either primary neoplasm of the liver such as cholangiocarcinoma or metastatic disease. The dominant mass should be easily amendable to image guided percutaneous biopsy. Additionally, consider further investigation with PET-CT to assess for distant metastatic disease. 2. Small pleural effusions. Electronically Signed   By: Lovena Le  Clovis Riley M.D.   On: 01/10/2018 10:25   Mr 3d Recon At Scanner  Result Date: 01/10/2018 CLINICAL DATA:  Abdominal pain. 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 EXAM: MRI ABDOMEN WITHOUT CONTRAST  (INCLUDING MRCP) TECHNIQUE: Multiplanar multisequence MR imaging of the abdomen was performed. Heavily T2-weighted images of the biliary and pancreatic ducts were obtained, and three-dimensional MRCP images were rendered by post processing. COMPARISON:  01/09/2018 FINDINGS: Lower chest: Small bilateral pleural effusions identified. Hepatobiliary: There is a large heterogeneous mass involving much of the entirety of segment 4. The mass measures 10.2 by 10.5 by 9.4 cm, image 14/6. A second smaller lesion is identified within the periphery of segment 7 measuring 2.7 cm, image 29/2. The common bile duct has a normal caliber. Pericholecystic fluid is identified. No gallstones identified. Pancreas: Within the limitations of unenhanced technique no pancreas abnormality identified. No main duct dilatation or mass. Spleen:  Normal appearance of the spleen. Adrenals/Urinary Tract: Normal appearance of the adrenal glands. No kidney mass or hydronephrosis identified. Stomach/Bowel: The stomach is normal. The visualized bowel loops within the upper abdomen are unremarkable. Vascular/Lymphatic: Normal appearance of the abdominal aorta. No retroperitoneal adenopathy. Other: There is a small amount of free fluid  identified within the upper abdomen. Musculoskeletal: No suspicious bone lesions identified. IMPRESSION: 1. Examination confirms presence of large heterogeneous mass involving almost the entire segment 4 of the liver. A smaller lesion is identified within the lateral aspect of segment 7. Imaging findings are compatible with either primary neoplasm of the liver such as cholangiocarcinoma or metastatic disease. The dominant mass should be easily amendable to image guided percutaneous biopsy. Additionally, consider further investigation with PET-CT to assess for distant metastatic disease. 2. Small pleural effusions. Electronically Signed   By: Kerby Moors M.D.   On: 01/10/2018 10:25   Dg Chest Port 1 View  Result Date: 01/09/2018 CLINICAL DATA:  Fever, nausea and body aches for 6 days. EXAM: PORTABLE CHEST 1 VIEW COMPARISON:  January 09, 2018 11:20 a.m. FINDINGS: The mediastinal contour is normal. The heart size is mildly enlarged. The lung volumes are low. There is no focal pneumonia, pulmonary edema, or pleural effusion. The visualized skeletal structures are unremarkable. IMPRESSION: Mild enlarged cardiac silhouette. Low lung volumes. No focal pneumonia or frank pulmonary edema is noted. Electronically Signed   By: Abelardo Diesel M.D.   On: 01/09/2018 16:10   [place] Ekg Site Rite  Result Date: 01/11/2018 If Site Rite image not attached, placement could not be confirmed due to current cardiac rhythm.       Scheduled Meds: . sodium chloride   Intravenous Once  . potassium chloride  40 mEq Oral Once   Continuous Infusions: . sodium chloride    . sodium chloride Stopped (01/11/18 0548)  . piperacillin-tazobactam (ZOSYN)  IV 12.5 mL/hr at 01/11/18 0600     LOS: 2 days    Time spent: 35 minutes.     Hosie Poisson, MD [facility] Pager [phone]  If 7PM-7AM, please contact night-coverage [email or web address] Password [email or web address] 01/11/2018, 9:02 AM    Addendum: Post IR guided liver biopsy pt  became hypoxic and required high flow oxygen at 8 lit/min.  On exam  She is lethargic but able to answer all questions and oriented.  CVS s1s2, slightly tachycardic rate in 90/min.  Lungs bilateral rales, air entry fair. Poor inspiratory effort.  Abdomen: Soft mildly tender at the drain site.  Extremities; no pedal edema.    Plan: Transfer to step down. [place] oxygen to keep sats greater  than 90%.  CXR stat.  Monitor closely.  Discussed the plan with the patient and family at bedside.    Hosie Poisson,. MD  [phone]

## 2018-01-11 NOTE — Progress Notes (Signed)
For Picc placement ,Dr Elsworth Soho is aware of the creatinine clearance of 17,ok'd to place PICC.

## 2018-01-12 ENCOUNTER — Inpatient Hospital Stay (HOSPITAL_COMMUNITY)

## 2018-01-12 DIAGNOSIS — I5030 Unspecified diastolic (congestive) heart failure: Secondary | ICD-10-CM

## 2018-01-12 LAB — COMPREHENSIVE METABOLIC PANEL
ALBUMIN: 2.1 g/dL — AB (ref 3.5–5.0)
ALK PHOS: 114 U/L (ref 38–126)
ALT: 487 U/L — ABNORMAL HIGH (ref 0–44)
ALT: 659 U/L — AB (ref 0–44)
ANION GAP: 11 (ref 5–15)
ANION GAP: 11 (ref 5–15)
AST: 669 U/L — ABNORMAL HIGH (ref 15–41)
AST: 1080 U/L — ABNORMAL HIGH (ref 15–41)
Albumin: 2.1 g/dL — ABNORMAL LOW (ref 3.5–5.0)
Alkaline Phosphatase: 106 U/L (ref 38–126)
BUN: 70 mg/dL — ABNORMAL HIGH (ref 8–23)
BUN: 70 mg/dL — ABNORMAL HIGH (ref 8–23)
CALCIUM: 7.2 mg/dL — AB (ref 8.9–10.3)
CHLORIDE: 105 mmol/L (ref 98–111)
CO2: 17 mmol/L — AB (ref 22–32)
CO2: 18 mmol/L — ABNORMAL LOW (ref 22–32)
Calcium: 6.3 mg/dL — CL (ref 8.9–10.3)
Chloride: 104 mmol/L (ref 98–111)
Creatinine, Ser: 4.24 mg/dL — ABNORMAL HIGH (ref 0.44–1.00)
Creatinine, Ser: 4.27 mg/dL — ABNORMAL HIGH (ref 0.44–1.00)
GFR calc Af Amer: 12 mL/min — ABNORMAL LOW (ref >60)
GFR calc non Af Amer: 10 mL/min — ABNORMAL LOW (ref >60)
GFR, EST AFRICAN AMERICAN: 12 mL/min — AB (ref >60)
GFR, EST NON AFRICAN AMERICAN: 10 mL/min — AB (ref >60)
GLUCOSE: 123 mg/dL — AB (ref 70–99)
Glucose, Bld: 103 mg/dL — ABNORMAL HIGH (ref 70–99)
POTASSIUM: 3.4 mmol/L — AB (ref 3.5–5.1)
Potassium: 3.3 mmol/L — ABNORMAL LOW (ref 3.5–5.1)
SODIUM: 133 mmol/L — AB (ref 135–145)
Sodium: 133 mmol/L — ABNORMAL LOW (ref 135–145)
TOTAL PROTEIN: 4.7 g/dL — AB (ref 6.5–8.1)
TOTAL PROTEIN: 4.7 g/dL — AB (ref 6.5–8.1)
Total Bilirubin: 3.1 mg/dL — ABNORMAL HIGH (ref 0.3–1.2)
Total Bilirubin: 3.2 mg/dL — ABNORMAL HIGH (ref 0.3–1.2)

## 2018-01-12 LAB — BPAM PLATELET PHERESIS
BLOOD PRODUCT EXPIRATION DATE: 201908092359
BLOOD PRODUCT EXPIRATION DATE: 201908092359
ISSUE DATE / TIME: 201908091103
ISSUE DATE / TIME: 201908091158
UNIT TYPE AND RH: 7300
Unit Type and Rh: 6200

## 2018-01-12 LAB — CBC
HCT: 24.1 % — ABNORMAL LOW (ref 36.0–46.0)
HEMOGLOBIN: 8.1 g/dL — AB (ref 12.0–15.0)
MCH: 28.9 pg (ref 26.0–34.0)
MCHC: 33.6 g/dL (ref 30.0–36.0)
MCV: 86.1 fL (ref 78.0–100.0)
Platelets: 42 10*3/uL — ABNORMAL LOW (ref 150–400)
RBC: 2.8 MIL/uL — AB (ref 3.87–5.11)
RDW: 14.6 % (ref 11.5–15.5)
WBC: 23 10*3/uL — ABNORMAL HIGH (ref 4.0–10.5)

## 2018-01-12 LAB — URINALYSIS, ROUTINE W REFLEX MICROSCOPIC
BILIRUBIN URINE: NEGATIVE
Glucose, UA: NEGATIVE mg/dL
Ketones, ur: NEGATIVE mg/dL
Nitrite: NEGATIVE
PH: 5 (ref 5.0–8.0)
Protein, ur: 100 mg/dL — AB
SPECIFIC GRAVITY, URINE: 1.018 (ref 1.005–1.030)

## 2018-01-12 LAB — ECHOCARDIOGRAM COMPLETE
Height: 72 in
Weight: 3480 oz

## 2018-01-12 LAB — PREPARE PLATELET PHERESIS
UNIT DIVISION: 0
Unit division: 0

## 2018-01-12 LAB — LACTIC ACID, PLASMA
LACTIC ACID, VENOUS: 1.4 mmol/L (ref 0.5–1.9)
LACTIC ACID, VENOUS: 0.8 mmol/L (ref 0.5–1.9)
LACTIC ACID, VENOUS: 0.8 mmol/L (ref 0.5–1.9)

## 2018-01-12 LAB — PROCALCITONIN
Procalcitonin: 41.69 ng/mL
Procalcitonin: 44.44 ng/mL

## 2018-01-12 MED ORDER — HYDROMORPHONE HCL 1 MG/ML IJ SOLN
1.0000 mg | INTRAMUSCULAR | Status: DC | PRN
  Administered 2018-01-12 – 2018-01-20 (×22): 1 mg via INTRAVENOUS
  Filled 2018-01-12 (×24): qty 1

## 2018-01-12 MED ORDER — SODIUM CHLORIDE 0.9 % IV SOLN
1.0000 g | Freq: Once | INTRAVENOUS | Status: AC
  Administered 2018-01-12: 1 g via INTRAVENOUS
  Filled 2018-01-12: qty 10

## 2018-01-12 MED ORDER — POTASSIUM CHLORIDE CRYS ER 20 MEQ PO TBCR
20.0000 meq | EXTENDED_RELEASE_TABLET | Freq: Once | ORAL | Status: AC
  Administered 2018-01-12: 20 meq via ORAL
  Filled 2018-01-12: qty 1

## 2018-01-12 NOTE — Progress Notes (Signed)
Pt arrived to [facility]. SWOT transported pt. Pt placed on monitor, CCMD called, CHG wipes, VS documented. Pt noted to have low BP 60's/ 40's. MD called and orders rec'd. Started IVF bolus'. Labs drawn, and monitored pt until pressures improved. Pt resting with call bell within reach.  Will continue to monitor.

## 2018-01-12 NOTE — Progress Notes (Signed)
  Echocardiogram 2D Echocardiogram has been performed.  Darlina Sicilian M 01/12/2018, 12:38 PM

## 2018-01-12 NOTE — Progress Notes (Signed)
CRITICAL VALUE ALERT  Critical Value:  Ca 6.3  Date & Time Notied:  8/10 0045  Provider Notified: D[place]   Orders Received/Actions taken: replacement ordered

## 2018-01-12 NOTE — Progress Notes (Signed)
[facility] Progress Note Patient Name: GWENLYN HOTTINGER DOB: 10-29-1955 MRN: [id number]   Date of Service  01/12/2018  HPI/Events of Note  Hypocalcemia in the setting of AKI  eICU Interventions  Calcium replaced     Intervention Category Intermediate Interventions: Electrolyte abnormality - evaluation and management  Iveth Heidemann 01/12/2018, 12:50 AM

## 2018-01-12 NOTE — Progress Notes (Signed)
PROGRESS NOTE    Lisa Bauer  QIW:[id number] DOB: [birth year] DOA: 01/09/2018  PCP: Wenda Low, MD   Brief Narrative:  62 year old lady with prior h/o  comes in for fever, abdominal pain, was found to have hypotension and elevated lactic acid. Further evaluation with a CT abdomen showed heterogenous liver mass. She was initially admitted by [facility] for septic shock and briefly required pressors.  She is off the pressors and transferred to [facility] service on 8/9.    Assessment & Plan:   Septic shock  / Streptococcus bacteremia - Thought to be due to possible liver abscess - Underwent drainage placement 8/9 and biopsy - Will follow up on biopsy results - Continue zosyn for now - Blood cx growing streptococcus  Acute kidney injury with metabolic acidosis - ATN or pre-renal etiology from septic shock - Monitor daily BMP  Hypokalemia / Hypomagnesemia and hypophosphatemia - Supplemented  Anemia, normocytic  - Follow up CBC daily   Thrombocytopenia - S/P 2 U platelets - follow up CBC in am  Heterogenous liver mass - Follow up liver bx results    DVT prophylaxis: SCD's Code Status: full code  Family Communication: son at bedside Disposition Plan: not yet stable for discharge    Consultants:   Radiology  Procedures:   Liver bx  Antimicrobials:   Zosyn 8/8 -->   Subjective: Abd pain this am.  Objective: Vitals:   01/12/18 0450 01/12/18 0500 01/12/18 0800 01/12/18 1111  BP:   103/70 116/78  Pulse:  66 65   Resp:  18 17 20   Temp:   97.9 F (36.6 C) 98.3 F (36.8 C)  TempSrc:   Oral Oral  SpO2:  100% 97% 96%  Weight: 98.7 kg     Height:        Intake/Output Summary (Last 24 hours) at 01/12/2018 1450 Last data filed at 01/12/2018 0500 Gross per 24 hour  Intake 2899 ml  Output 460 ml  Net 2439 ml   Filed Weights   01/10/18 0458 01/11/18 0500 01/12/18 0450  Weight: 84.4 kg 93.9 kg 98.7 kg    Examination:  General exam: Appears calm and  comfortable  Respiratory system: Clear to auscultation. Respiratory effort normal. Cardiovascular system: S1 & S2 heard, RRR. No JVD, murmurs, rubs, gallops or clicks. No pedal edema. Gastrointestinal system: Abdomen is nondistended, soft and nontender. No organomegaly or masses felt. Normal bowel sounds heard. Central nervous system: Alert and oriented. No focal neurological deficits. Extremities: Symmetric 5 x 5 power. Skin: No rashes, lesions or ulcers Psychiatry: Judgement and insight appear normal. Mood & affect appropriate.   Data Reviewed: I have personally reviewed following labs and imaging studies  CBC: Recent Labs  Lab 01/09/18 1151 01/09/18 1655 01/11/18 0356 01/11/18 1829 01/12/18 0258  WBC 9.9 12.0* 14.9* 18.3* 23.0*  NEUTROABS 8.4*  --   --   --   --   HGB 12.3 10.4* 10.6* 9.3* 8.1*  HCT 36.6 31.5* 31.3* 27.2* 24.1*  MCV 87.6 89.0 84.8 85.3 86.1  PLT 93* 58* 54* 59* 42*   Basic Metabolic Panel: Recent Labs  Lab 01/09/18 1443  01/09/18 2011 01/10/18 0228 01/11/18 0356 01/11/18 2144 01/12/18 0258  NA  --   --  132* 134* 133* 133* 133*  K  --   --  3.5 2.8* 3.4* 3.4* 3.3*  CL  --   --  95* 99 101 105 104  CO2  --   --  22 21* 17* 17*  18*  GLUCOSE  --   --  101* 86 110* 123* 103*  BUN  --   --  39* 45* 63* 70* 70*  CREATININE  --    < > 2.43* 3.01* 3.72* 4.24* 4.27*  CALCIUM  --   --  7.0* 6.8* 6.7* 6.3* 7.2*  MG 1.6*  --   --  1.6* 2.4  --   --   PHOS  --   --   --  2.4* 3.8  --   --    < > = values in this interval not displayed.   GFR: Estimated Creatinine Clearance: 18 mL/min (A) (by C-G formula based on SCr of 4.27 mg/dL (H)). Liver Function Tests: Recent Labs  Lab 01/09/18 1151 01/11/18 1105 01/11/18 2144 01/12/18 0258  AST 437* 273* 669* 1,080*  ALT 464* 319* 487* 659*  ALKPHOS 86 70 106 114  BILITOT 3.3* 2.4* 3.1* 3.2*  PROT 6.1* 5.1* 4.7* 4.7*  ALBUMIN 2.9* 2.2* 2.1* 2.1*   Recent Labs  Lab 01/09/18 1151  LIPASE 24   No results  for input(s): AMMONIA in the last 168 hours. Coagulation Profile: Recent Labs  Lab 01/09/18 1151  INR 1.24   Cardiac Enzymes: No results for input(s): CKTOTAL, CKMB, CKMBINDEX, TROPONINI in the last 168 hours. BNP (last 3 results) No results for input(s): PROBNP in the last 8760 hours. HbA1C: No results for input(s): HGBA1C in the last 72 hours. CBG: Recent Labs  Lab 01/09/18 1640 01/10/18 0843 01/10/18 1624  GLUCAP 103* 95 125*   Lipid Profile: No results for input(s): CHOL, HDL, LDLCALC, TRIG, CHOLHDL, LDLDIRECT in the last 72 hours. Thyroid Function Tests: No results for input(s): TSH, T4TOTAL, FREET4, T3FREE, THYROIDAB in the last 72 hours. Anemia Panel: Recent Labs    01/11/18 0930 01/11/18 1105  VITAMINB12  --  3,227*  FOLATE 14.5  --   FERRITIN  --  487*  TIBC  --  158*  IRON  --  15*  RETICCTPCT  --  0.6   Urine analysis:    Component Value Date/Time   COLORURINE AMBER (A) 01/11/2018 0024   APPEARANCEUR CLOUDY (A) 01/11/2018 0024   LABSPEC 1.018 01/11/2018 0024   PHURINE 5.0 01/11/2018 0024   GLUCOSEU NEGATIVE 01/11/2018 0024   HGBUR MODERATE (A) 01/11/2018 0024   BILIRUBINUR NEGATIVE 01/11/2018 0024   KETONESUR NEGATIVE 01/11/2018 0024   PROTEINUR 100 (A) 01/11/2018 0024   NITRITE NEGATIVE 01/11/2018 0024   LEUKOCYTESUR TRACE (A) 01/11/2018 0024   Sepsis Labs: @LABRCNTIP (procalcitonin:4,lacticidven:4)   ) Recent Results (from the past 240 hour(s))  Blood Culture (routine x 2)     Status: Abnormal (Preliminary result)   Collection Time: 01/09/18 11:40 AM  Result Value Ref Range Status   Specimen Description BLOOD RIGHT ANTECUBITAL  Final   Special Requests   Final    BOTTLES DRAWN AEROBIC AND ANAEROBIC Blood Culture adequate volume   Culture  Setup Time   Final    GRAM POSITIVE COCCI IN CHAINS IN BOTH AEROBIC AND ANAEROBIC BOTTLES CRITICAL RESULT CALLED TO, READ BACK BY AND VERIFIED WITH: Laurice Record PharmD 16:10 01/10/18 ([email or web address]) Performed at  [facility], [place] [address], [place], [place] [address]    Culture STREPTOCOCCUS CONSTELLATUS (A)  Final   Report Status PENDING  Incomplete  Blood Culture ID Panel (Reflexed)     Status: Abnormal   Collection Time: 01/09/18 11:40 AM  Result Value Ref Range Status   Enterococcus species NOT DETECTED NOT DETECTED  Final   Listeria monocytogenes NOT DETECTED NOT DETECTED Final   Staphylococcus species NOT DETECTED NOT DETECTED Final   Staphylococcus aureus NOT DETECTED NOT DETECTED Final   Streptococcus species DETECTED (A) NOT DETECTED Final    Comment: Not Enterococcus species, Streptococcus agalactiae, Streptococcus pyogenes, or Streptococcus pneumoniae. CRITICAL RESULT CALLED TO, READ BACK BY AND VERIFIED WITH: Laurice Record PharmD 16:10 01/10/18 ([email or web address])    Streptococcus agalactiae NOT DETECTED NOT DETECTED Final   Streptococcus pneumoniae NOT DETECTED NOT DETECTED Final   Streptococcus pyogenes NOT DETECTED NOT DETECTED Final   Acinetobacter baumannii NOT DETECTED NOT DETECTED Final   Enterobacteriaceae species NOT DETECTED NOT DETECTED Final   Enterobacter cloacae complex NOT DETECTED NOT DETECTED Final   Escherichia coli NOT DETECTED NOT DETECTED Final   Klebsiella oxytoca NOT DETECTED NOT DETECTED Final   Klebsiella pneumoniae NOT DETECTED NOT DETECTED Final   Proteus species NOT DETECTED NOT DETECTED Final   Serratia marcescens NOT DETECTED NOT DETECTED Final   Haemophilus influenzae NOT DETECTED NOT DETECTED Final   Neisseria meningitidis NOT DETECTED NOT DETECTED Final   Pseudomonas aeruginosa NOT DETECTED NOT DETECTED Final   Candida albicans NOT DETECTED NOT DETECTED Final   Candida glabrata NOT DETECTED NOT DETECTED Final   Candida krusei NOT DETECTED NOT DETECTED Final   Candida parapsilosis NOT DETECTED NOT DETECTED Final   Candida tropicalis NOT DETECTED NOT DETECTED Final    Comment: Performed at [facility], [place] [address] [place], [place] [address]    Blood Culture (routine x 2)     Status: Abnormal (Preliminary result)   Collection Time: 01/09/18 11:50 AM  Result Value Ref Range Status   Specimen Description BLOOD LEFT ANTECUBITAL  Final   Special Requests   Final    BOTTLES DRAWN AEROBIC AND ANAEROBIC Blood Culture adequate volume   Culture  Setup Time   Final    GRAM POSITIVE COCCI IN CHAINS ANAEROBIC BOTTLE ONLY CRITICAL VALUE NOTED.  VALUE IS CONSISTENT WITH PREVIOUSLY REPORTED AND CALLED VALUE. Performed at [facility], [place] [address], [place], [place] [address]    Culture STREPTOCOCCUS INTERMEDIUS (A)  Final   Report Status PENDING  Incomplete  MRSA PCR Screening     Status: None   Collection Time: 01/09/18  4:31 PM  Result Value Ref Range Status   MRSA by PCR NEGATIVE NEGATIVE Final    Comment:        The GeneXpert MRSA Assay (FDA approved for NASAL specimens only), is one component of a comprehensive MRSA colonization surveillance program. It is not intended to diagnose MRSA infection nor to guide or monitor treatment for MRSA infections. Performed at [facility], [place] [address], [place], [place] [address]   Aerobic/Anaerobic Culture (surgical/deep wound)     Status: None (Preliminary result)   Collection Time: 01/11/18  2:27 PM  Result Value Ref Range Status   Specimen Description ABSCESS  Final   Special Requests Normal  Final   Gram Stain   Final    MODERATE WBC PRESENT, PREDOMINANTLY PMN MODERATE GRAM POSITIVE COCCI    Culture   Final    NO GROWTH < 24 HOURS Performed at [facility], [place] [address] [place], [place] [address]    Report Status PENDING  Incomplete  Culture, blood (routine x 2)     Status: None (Preliminary result)   Collection Time: 01/11/18  9:29 PM  Result Value Ref Range Status   Specimen Description BLOOD BLOOD  LEFT FOREARM  Final   Special Requests   Final    BOTTLES DRAWN AEROBIC ONLY Blood Culture results may not be optimal due to an inadequate volume of  blood received in culture bottles   Culture   Final    NO GROWTH < 12 HOURS Performed at [facility] [address], [place], [place] [address]    Report Status PENDING  Incomplete      Radiology Studies: Ct Abdomen Pelvis Wo Contrast  Result Date: 01/09/2018 CLINICAL DATA:  62 year old female with a history of abdominal pain with nausea and vomiting EXAM: CT ABDOMEN AND PELVIS WITHOUT CONTRAST TECHNIQUE: Multidetector CT imaging of the abdomen and pelvis was performed following the standard protocol without IV contrast. COMPARISON:  09/24/2016 FINDINGS: Lower chest: No pleural effusion or confluent airspace disease. Hepatobiliary: Heterogeneously attenuating mass of the liver, prominently within segment 4, though extending into the right liver, segment 2 and segment 3. Estimated diameter on the axial images 9.5 cm x 8.7 cm. Additional nodule/mass within the periphery of the right liver on image 19. Gallbladder is relatively decompressed.  No radiopaque gallstones. Pancreas: Unremarkable appearance of pancreas Spleen: Unremarkable spleen Adrenals/Urinary Tract: Unremarkable adrenal glands. Bilateral kidneys without hydronephrosis or nephrolithiasis. Unremarkable course the bilateral ureters. Urinary bladder is relatively decompressed. Stomach/Bowel: Stomach unremarkable. No abnormally distended small bowel. No transition point. No focal inflammatory changes of the small bowel mesentery. Normal appendix. Colon is decompressed. Vascular/Lymphatic: No significant vascular calcifications. No mesenteric, retroperitoneal, or inguinal adenopathy. Reproductive: Fibroid changes of the uterus. Other: No large ventral wall hernia. Musculoskeletal: Grade 1 anterolisthesis of L4 on L5. Vacuum disc phenomenon at L4-L5 and L5-S1. No bony canal narrowing. Degenerative changes of the facets. Mild degenerative changes of the hips. No acute fracture IMPRESSION: Heterogeneously attenuating mass within the liver,  predominantly segment 4 measuring 9.5 cm by 8.7 cm. Differential includes abscess, primary liver tumor/biliary tumor, or metastases. Additional small pathologic focus within the lateral aspect of the right liver measuring approximately 2.6 cm. These results were called by telephone at the time of interpretation on 01/09/2018 at 2:25 pm to Dr. Lajean Saver , who verbally acknowledged these results. Fibroid changes of the uterus. Electronically Signed   By: Corrie Mckusick D.O.   On: 01/09/2018 14:28   Dg Chest 2 View  Result Date: 01/09/2018 CLINICAL DATA:  Fever, weakness and dizziness over the last 3 days. EXAM: CHEST - 2 VIEW COMPARISON:  09/23/2017 FINDINGS: Heart and mediastinal shadows are normal. Lung volumes are diminished which could be due to a poor inspiration for underlying lung disease. Lung markings appear more prominent diffusely, which again could relate to the poor inspiration or could indicate mild diffuse pneumonitis. No dense consolidation or lobar collapse. No effusions. No significant bone finding. IMPRESSION: Poor inspiration versus widespread pneumonitis. No dense consolidation or collapse. Electronically Signed   By: Nelson Chimes M.D.   On: 01/09/2018 11:29   US Guided Needle Placement  Result Date: 01/11/2018 INDICATION: Heterogeneous mass versus abscess of the liver with liquefied component. Presentation with abdominal pain and sepsis with positive blood cultures. EXAM: 1. ULTRASOUND-GUIDED CORE BIOPSY LIVER 2. ULTRASOUND GUIDED PERCUTANEOUS CATHETER DRAINAGE OF LIVER MEDICATIONS: The patient is currently admitted to the hospital and receiving intravenous antibiotics. The antibiotics were administered within an appropriate time frame prior to the initiation of the procedure. ANESTHESIA/SEDATION: Fentanyl 75 mcg IV; Versed 1.5 mg IV Moderate Sedation Time:  31 minutes. The patient was continuously monitored during the procedure by the  interventional radiology nurse under my direct  supervision. COMPLICATIONS: None immediate. PROCEDURE: Informed written consent was obtained from the patient after a thorough discussion of the procedural risks, benefits and alternatives. All questions were addressed. Maximal Sterile Barrier Technique was utilized including caps, mask, sterile gowns, sterile gloves, sterile drape, hand hygiene and skin antiseptic. A timeout was performed prior to the initiation of the procedure. Ultrasound was performed of the liver. Under ultrasound guidance, a 17 gauge needle was advanced into the right lobe. 18 gauge core biopsy samples were obtained with 3 samples submitted in formalin. The 17 gauge needle was then advanced into a liquified component. Aspiration of fluid was performed and sample sent for both culture analysis and cytology. A guidewire was advanced through the needle. The tract was dilated and a 10 French percutaneous drainage catheter advanced. Catheter position was confirmed by ultrasound. The catheter was connected to a suction bulb. The drainage catheter was secured at the skin with a Prolene retention suture and adhesive StatLock device. FINDINGS: Large heterogeneous abnormality of the liver is present with ill-defined borders. The area of parenchymal abnormality spans at least an area 11 cm. The echogenic rim surrounding a liquified component was sampled with tissue biopsy. Aspiration at the level of the liquefied component yielded bloody and turbid fluid. A 10 French drain was placed in this collection which is draining bloody fluid. IMPRESSION: Ultrasound-guided biopsy of the liver lesion along the edge of the liquefied component followed by aspiration of the liquefied component yielding bloody and turbid fluid which was sent for culture analysis and cytology. Additional placement of a 10 French percutaneous drainage catheter into the liquefied component which was attached to suction bulb drainage. Electronically Signed   By: Aletta Edouard M.D.   On:  01/11/2018 17:15   Mr Abdomen Mrcp Wo Contrast  Result Date: 01/10/2018 CLINICAL DATA:  Abdominal pain. 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 EXAM: MRI ABDOMEN WITHOUT CONTRAST  (INCLUDING MRCP) TECHNIQUE: Multiplanar multisequence MR imaging of the abdomen was performed. Heavily T2-weighted images of the biliary and pancreatic ducts were obtained, and three-dimensional MRCP images were rendered by post processing. COMPARISON:  01/09/2018 FINDINGS: Lower chest: Small bilateral pleural effusions identified. Hepatobiliary: There is a large heterogeneous mass involving much of the entirety of segment 4. The mass measures 10.2 by 10.5 by 9.4 cm, image 14/6. A second smaller lesion is identified within the periphery of segment 7 measuring 2.7 cm, image 29/2. The common bile duct has a normal caliber. Pericholecystic fluid is identified. No gallstones identified. Pancreas: Within the limitations of unenhanced technique no pancreas abnormality identified. No main duct dilatation or mass. Spleen:  Normal appearance of the spleen. Adrenals/Urinary Tract: Normal appearance of the adrenal glands. No kidney mass or hydronephrosis identified. Stomach/Bowel: The stomach is normal. The visualized bowel loops within the upper abdomen are unremarkable. Vascular/Lymphatic: Normal appearance of the abdominal aorta. No retroperitoneal adenopathy. Other: There is a small amount of free fluid identified within the upper abdomen. Musculoskeletal: No suspicious bone lesions identified. IMPRESSION: 1. Examination confirms presence of large heterogeneous mass involving almost the entire segment 4 of the liver. A smaller lesion is identified within the lateral aspect of segment 7. Imaging findings are compatible with either primary neoplasm of the liver such as cholangiocarcinoma or metastatic disease. The dominant mass should be easily amendable to image guided percutaneous biopsy. Additionally, consider further investigation with  PET-CT to assess for distant metastatic disease. 2. Small pleural effusions. Electronically Signed   By: Queen Slough.D.  On: 01/10/2018 10:25   Mr 3d Recon At Scanner  Result Date: 01/10/2018 CLINICAL DATA:  Abdominal pain. 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 EXAM: MRI ABDOMEN WITHOUT CONTRAST  (INCLUDING MRCP) TECHNIQUE: Multiplanar multisequence MR imaging of the abdomen was performed. Heavily T2-weighted images of the biliary and pancreatic ducts were obtained, and three-dimensional MRCP images were rendered by post processing. COMPARISON:  01/09/2018 FINDINGS: Lower chest: Small bilateral pleural effusions identified. Hepatobiliary: There is a large heterogeneous mass involving much of the entirety of segment 4. The mass measures 10.2 by 10.5 by 9.4 cm, image 14/6. A second smaller lesion is identified within the periphery of segment 7 measuring 2.7 cm, image 29/2. The common bile duct has a normal caliber. Pericholecystic fluid is identified. No gallstones identified. Pancreas: Within the limitations of unenhanced technique no pancreas abnormality identified. No main duct dilatation or mass. Spleen:  Normal appearance of the spleen. Adrenals/Urinary Tract: Normal appearance of the adrenal glands. No kidney mass or hydronephrosis identified. Stomach/Bowel: The stomach is normal. The visualized bowel loops within the upper abdomen are unremarkable. Vascular/Lymphatic: Normal appearance of the abdominal aorta. No retroperitoneal adenopathy. Other: There is a small amount of free fluid identified within the upper abdomen. Musculoskeletal: No suspicious bone lesions identified. IMPRESSION: 1. Examination confirms presence of large heterogeneous mass involving almost the entire segment 4 of the liver. A smaller lesion is identified within the lateral aspect of segment 7. Imaging findings are compatible with either primary neoplasm of the liver such as cholangiocarcinoma or metastatic disease. The  dominant mass should be easily amendable to image guided percutaneous biopsy. Additionally, consider further investigation with PET-CT to assess for distant metastatic disease. 2. Small pleural effusions. Electronically Signed   By: Kerby Moors M.D.   On: 01/10/2018 10:25   US Biopsy (liver)  Result Date: 01/11/2018 INDICATION: Heterogeneous mass versus abscess of the liver with liquefied component. Presentation with abdominal pain and sepsis with positive blood cultures. EXAM: 1. ULTRASOUND-GUIDED CORE BIOPSY LIVER 2. ULTRASOUND GUIDED PERCUTANEOUS CATHETER DRAINAGE OF LIVER MEDICATIONS: The patient is currently admitted to the hospital and receiving intravenous antibiotics. The antibiotics were administered within an appropriate time frame prior to the initiation of the procedure. ANESTHESIA/SEDATION: Fentanyl 75 mcg IV; Versed 1.5 mg IV Moderate Sedation Time:  31 minutes. The patient was continuously monitored during the procedure by the interventional radiology nurse under my direct supervision. COMPLICATIONS: None immediate. PROCEDURE: Informed written consent was obtained from the patient after a thorough discussion of the procedural risks, benefits and alternatives. All questions were addressed. Maximal Sterile Barrier Technique was utilized including caps, mask, sterile gowns, sterile gloves, sterile drape, hand hygiene and skin antiseptic. A timeout was performed prior to the initiation of the procedure. Ultrasound was performed of the liver. Under ultrasound guidance, a 17 gauge needle was advanced into the right lobe. 18 gauge core biopsy samples were obtained with 3 samples submitted in formalin. The 17 gauge needle was then advanced into a liquified component. Aspiration of fluid was performed and sample sent for both culture analysis and cytology. A guidewire was advanced through the needle. The tract was dilated and a 10 French percutaneous drainage catheter advanced. Catheter position was  confirmed by ultrasound. The catheter was connected to a suction bulb. The drainage catheter was secured at the skin with a Prolene retention suture and adhesive StatLock device. FINDINGS: Large heterogeneous abnormality of the liver is present with ill-defined borders. The area of parenchymal abnormality spans at least an area 11 cm. The echogenic rim  surrounding a liquified component was sampled with tissue biopsy. Aspiration at the level of the liquefied component yielded bloody and turbid fluid. A 10 French drain was placed in this collection which is draining bloody fluid. IMPRESSION: Ultrasound-guided biopsy of the liver lesion along the edge of the liquefied component followed by aspiration of the liquefied component yielding bloody and turbid fluid which was sent for culture analysis and cytology. Additional placement of a 10 French percutaneous drainage catheter into the liquefied component which was attached to suction bulb drainage. Electronically Signed   By: Aletta Edouard M.D.   On: 01/11/2018 17:15   Dg Chest Port 1 View  Result Date: 01/11/2018 CLINICAL DATA:  Dyspnea. EXAM: PORTABLE CHEST 1 VIEW COMPARISON:  Radiograph of January 09, 2018. FINDINGS: Stable cardiomediastinal silhouette. No pneumothorax is noted. Left lung is clear. Right-sided PICC line is noted with distal tip in expected position of SVC. Mild right basilar opacity is noted concerning for atelectasis or infiltrate with associated pleural effusion. Bony thorax is unremarkable. IMPRESSION: Interval development of mild right basilar opacity concerning for atelectasis or infiltrate with possible associated pleural effusion. Interval placement of right-sided PICC line with distal tip in expected position of SVC. Electronically Signed   By: Marijo Conception, M.D.   On: 01/11/2018 15:27   Dg Chest Port 1 View  Result Date: 01/09/2018 CLINICAL DATA:  Fever, nausea and body aches for 6 days. EXAM: PORTABLE CHEST 1 VIEW COMPARISON:   January 09, 2018 11:20 a.m. FINDINGS: The mediastinal contour is normal. The heart size is mildly enlarged. The lung volumes are low. There is no focal pneumonia, pulmonary edema, or pleural effusion. The visualized skeletal structures are unremarkable. IMPRESSION: Mild enlarged cardiac silhouette. Low lung volumes. No focal pneumonia or frank pulmonary edema is noted. Electronically Signed   By: Abelardo Diesel M.D.   On: 01/09/2018 16:10   [place] Ekg Site Rite  Result Date: 01/11/2018 If Site Rite image not attached, placement could not be confirmed due to current cardiac rhythm.       Scheduled Meds: . sodium chloride flush  10-40 mL Intracatheter Q12H  . sodium chloride flush  5 mL Intracatheter Q8H   Continuous Infusions: . sodium chloride Stopped (01/12/18 0453)  . sodium chloride 100 mL/hr at 01/12/18 0810  . piperacillin-tazobactam (ZOSYN)  IV 3.375 g (01/12/18 0453)     LOS: 3 days    Time spent: 25 minutes Greater than 50% of the time spent on counseling and coordinating the care.   Leisa Lenz, MD [facility] Pager [phone]  If 7PM-7AM, please contact night-coverage [email or web address] Password [email or web address] 01/12/2018, 2:50 PM

## 2018-01-12 NOTE — Progress Notes (Signed)
S: She feels much better today O:BP 103/70 (BP Location: Left Arm)   Pulse 65   Temp 97.9 F (36.6 C) (Oral)   Resp 17   Ht 6' (1.829 m)   Wt 98.7 kg   LMP  (LMP Unknown)   SpO2 97%   BMI 29.50 kg/m   Intake/Output Summary (Last 24 hours) at 01/12/2018 1136 Last data filed at 01/12/2018 0500 Gross per 24 hour  Intake 3399.97 ml  Output 610 ml  Net 2789.97 ml   Intake/Output: I/O last 3 completed shifts: In: 5269.3 [I.V.:4170; Blood:672; Other:50; IV Piggyback:377.3] Out: 1310 [Urine:1120; Drains:190]  Intake/Output this shift:  No intake/output data recorded. Weight change: 4.757 kg Gen: NAD CVS:no rub Resp: decreased BS at bases Abd: +BS, soft, RUQ tenderness Ext: trace edema  Recent Labs  Lab 01/09/18 1151 01/09/18 1655 01/09/18 2011 01/10/18 0228 01/11/18 0356 01/11/18 1105 01/11/18 2144 01/12/18 0258  NA 127*  --  132* 134* 133*  --  133* 133*  K 2.8*  --  3.5 2.8* 3.4*  --  3.4* 3.3*  CL 87*  --  95* 99 101  --  105 104  CO2 25  --  22 21* 17*  --  17* 18*  GLUCOSE 116*  --  101* 86 110*  --  123* 103*  BUN 38*  --  39* 45* 63*  --  70* 70*  CREATININE 2.48* 2.32* 2.43* 3.01* 3.72*  --  4.24* 4.27*  ALBUMIN 2.9*  --   --   --   --  2.2* 2.1* 2.1*  CALCIUM 7.6*  --  7.0* 6.8* 6.7*  --  6.3* 7.2*  PHOS  --   --   --  2.4* 3.8  --   --   --   AST 437*  --   --   --   --  273* 669* 1,080*  ALT 464*  --   --   --   --  319* 487* 659*   Liver Function Tests: Recent Labs  Lab 01/11/18 1105 01/11/18 2144 01/12/18 0258  AST 273* 669* 1,080*  ALT 319* 487* 659*  ALKPHOS 70 106 114  BILITOT 2.4* 3.1* 3.2*  PROT 5.1* 4.7* 4.7*  ALBUMIN 2.2* 2.1* 2.1*   Recent Labs  Lab 01/09/18 1151  LIPASE 24   No results for input(s): AMMONIA in the last 168 hours. CBC: Recent Labs  Lab 01/09/18 1151 01/09/18 1655 01/11/18 0356 01/11/18 1829 01/12/18 0258  WBC 9.9 12.0* 14.9* 18.3* 23.0*  NEUTROABS 8.4*  --   --   --   --   HGB 12.3 10.4* 10.6* 9.3* 8.1*   HCT 36.6 31.5* 31.3* 27.2* 24.1*  MCV 87.6 89.0 84.8 85.3 86.1  PLT 93* 58* 54* 59* 42*   Cardiac Enzymes: No results for input(s): CKTOTAL, CKMB, CKMBINDEX, TROPONINI in the last 168 hours. CBG: Recent Labs  Lab 01/09/18 1640 01/10/18 0843 01/10/18 1624  GLUCAP 103* 95 125*    Iron Studies:  Recent Labs    01/11/18 1105  IRON 15*  TIBC 158*  FERRITIN 487*   Studies/Results: US Guided Needle Placement  Result Date: 01/11/2018 INDICATION: Heterogeneous mass versus abscess of the liver with liquefied component. Presentation with abdominal pain and sepsis with positive blood cultures. EXAM: 1. ULTRASOUND-GUIDED CORE BIOPSY LIVER 2. ULTRASOUND GUIDED PERCUTANEOUS CATHETER DRAINAGE OF LIVER MEDICATIONS: The patient is currently admitted to the hospital and receiving intravenous antibiotics. The antibiotics were administered within an appropriate time frame prior  to the initiation of the procedure. ANESTHESIA/SEDATION: Fentanyl 75 mcg IV; Versed 1.5 mg IV Moderate Sedation Time:  31 minutes. The patient was continuously monitored during the procedure by the interventional radiology nurse under my direct supervision. COMPLICATIONS: None immediate. PROCEDURE: Informed written consent was obtained from the patient after a thorough discussion of the procedural risks, benefits and alternatives. All questions were addressed. Maximal Sterile Barrier Technique was utilized including caps, mask, sterile gowns, sterile gloves, sterile drape, hand hygiene and skin antiseptic. A timeout was performed prior to the initiation of the procedure. Ultrasound was performed of the liver. Under ultrasound guidance, a 17 gauge needle was advanced into the right lobe. 18 gauge core biopsy samples were obtained with 3 samples submitted in formalin. The 17 gauge needle was then advanced into a liquified component. Aspiration of fluid was performed and sample sent for both culture analysis and cytology. A guidewire was  advanced through the needle. The tract was dilated and a 10 French percutaneous drainage catheter advanced. Catheter position was confirmed by ultrasound. The catheter was connected to a suction bulb. The drainage catheter was secured at the skin with a Prolene retention suture and adhesive StatLock device. FINDINGS: Large heterogeneous abnormality of the liver is present with ill-defined borders. The area of parenchymal abnormality spans at least an area 11 cm. The echogenic rim surrounding a liquified component was sampled with tissue biopsy. Aspiration at the level of the liquefied component yielded bloody and turbid fluid. A 10 French drain was placed in this collection which is draining bloody fluid. IMPRESSION: Ultrasound-guided biopsy of the liver lesion along the edge of the liquefied component followed by aspiration of the liquefied component yielding bloody and turbid fluid which was sent for culture analysis and cytology. Additional placement of a 10 French percutaneous drainage catheter into the liquefied component which was attached to suction bulb drainage. Electronically Signed   By: Aletta Edouard M.D.   On: 01/11/2018 17:15   US Biopsy (liver)  Result Date: 01/11/2018 INDICATION: Heterogeneous mass versus abscess of the liver with liquefied component. Presentation with abdominal pain and sepsis with positive blood cultures. EXAM: 1. ULTRASOUND-GUIDED CORE BIOPSY LIVER 2. ULTRASOUND GUIDED PERCUTANEOUS CATHETER DRAINAGE OF LIVER MEDICATIONS: The patient is currently admitted to the hospital and receiving intravenous antibiotics. The antibiotics were administered within an appropriate time frame prior to the initiation of the procedure. ANESTHESIA/SEDATION: Fentanyl 75 mcg IV; Versed 1.5 mg IV Moderate Sedation Time:  31 minutes. The patient was continuously monitored during the procedure by the interventional radiology nurse under my direct supervision. COMPLICATIONS: None immediate. PROCEDURE:  Informed written consent was obtained from the patient after a thorough discussion of the procedural risks, benefits and alternatives. All questions were addressed. Maximal Sterile Barrier Technique was utilized including caps, mask, sterile gowns, sterile gloves, sterile drape, hand hygiene and skin antiseptic. A timeout was performed prior to the initiation of the procedure. Ultrasound was performed of the liver. Under ultrasound guidance, a 17 gauge needle was advanced into the right lobe. 18 gauge core biopsy samples were obtained with 3 samples submitted in formalin. The 17 gauge needle was then advanced into a liquified component. Aspiration of fluid was performed and sample sent for both culture analysis and cytology. A guidewire was advanced through the needle. The tract was dilated and a 10 French percutaneous drainage catheter advanced. Catheter position was confirmed by ultrasound. The catheter was connected to a suction bulb. The drainage catheter was secured at the skin with a Prolene  retention suture and adhesive StatLock device. FINDINGS: Large heterogeneous abnormality of the liver is present with ill-defined borders. The area of parenchymal abnormality spans at least an area 11 cm. The echogenic rim surrounding a liquified component was sampled with tissue biopsy. Aspiration at the level of the liquefied component yielded bloody and turbid fluid. A 10 French drain was placed in this collection which is draining bloody fluid. IMPRESSION: Ultrasound-guided biopsy of the liver lesion along the edge of the liquefied component followed by aspiration of the liquefied component yielding bloody and turbid fluid which was sent for culture analysis and cytology. Additional placement of a 10 French percutaneous drainage catheter into the liquefied component which was attached to suction bulb drainage. Electronically Signed   By: Aletta Edouard M.D.   On: 01/11/2018 17:15   Dg Chest Port 1 View  Result  Date: 01/11/2018 CLINICAL DATA:  Dyspnea. EXAM: PORTABLE CHEST 1 VIEW COMPARISON:  Radiograph of January 09, 2018. FINDINGS: Stable cardiomediastinal silhouette. No pneumothorax is noted. Left lung is clear. Right-sided PICC line is noted with distal tip in expected position of SVC. Mild right basilar opacity is noted concerning for atelectasis or infiltrate with associated pleural effusion. Bony thorax is unremarkable. IMPRESSION: Interval development of mild right basilar opacity concerning for atelectasis or infiltrate with possible associated pleural effusion. Interval placement of right-sided PICC line with distal tip in expected position of SVC. Electronically Signed   By: Marijo Conception, M.D.   On: 01/11/2018 15:27   [place] Ekg Site Rite  Result Date: 01/11/2018 If Site Rite image not attached, placement could not be confirmed due to current cardiac rhythm.  . sodium chloride flush  10-40 mL Intracatheter Q12H  . sodium chloride flush  5 mL Intracatheter Q8H    BMET    Component Value Date/Time   NA 133 (L) 01/12/2018 0258   K 3.3 (L) 01/12/2018 0258   CL 104 01/12/2018 0258   CO2 18 (L) 01/12/2018 0258   GLUCOSE 103 (H) 01/12/2018 0258   BUN 70 (H) 01/12/2018 0258   CREATININE 4.27 (H) 01/12/2018 0258   CALCIUM 7.2 (L) 01/12/2018 0258   GFRNONAA 10 (L) 01/12/2018 0258   GFRAA 12 (L) 01/12/2018 0258   CBC    Component Value Date/Time   WBC 23.0 (H) 01/12/2018 0258   RBC 2.80 (L) 01/12/2018 0258   HGB 8.1 (L) 01/12/2018 0258   HGB 13.8 12/02/2012 1507   HCT 24.1 (L) 01/12/2018 0258   HCT 40.9 12/02/2012 1507   PLT 42 (L) 01/12/2018 0258   PLT 148 Platelet count consistent in citrate 12/02/2012 1507   MCV 86.1 01/12/2018 0258   MCV 90 12/02/2012 1507   MCH 28.9 01/12/2018 0258   MCHC 33.6 01/12/2018 0258   RDW 14.6 01/12/2018 0258   RDW 13.2 12/02/2012 1507   LYMPHSABS 0.9 01/09/2018 1151   LYMPHSABS 1.6 12/02/2012 1507   MONOABS 0.6 01/09/2018 1151   EOSABS 0.0 01/09/2018  1151   EOSABS 0.1 12/02/2012 1507   BASOSABS 0.0 01/09/2018 1151   BASOSABS 0.0 12/02/2012 1507     Assessment/Plan: 1.  AKI- nonoliguric, in setting of sepsis, hypotension, volume depletion and concomitant ACE inhibition.  Most consistent with ischemic ATN.  Electrolytes stable as well as volume status.  No indication for HD or CVVHD at this time and will continue to follow closely.  Agree with foley catheter placement.  CT scan without evidence of obstruction or stones.  Creatinine appears to be reaching a plateau.  Hopefully will start to see some improvement over the next 24 hours.  2. Sepsis- blood cultures positive for strep constellatus.  Continue with zosyn and await sensitivities.  Continue with IVF's and add pressors as needed 3. Liver mass/abscess- s/p aspiration with gram positive cocci in pairs, as above 4. Acute hypoxic Respiratory distress with wheezing- repeat cxr appears to have new infiltrate consistent with possible aspiration pneumonia- abx per PCCM. 5. Anemia of critical illness vs ABLA- following aspiration.  Follow H/H and transfuse prn 6. Thrombocytopenia- likely due to DIC.  Follow and avoid heparin. 7. Metabolic acidosis due to #1.  May need isotonic bicarb 8. Hypokalemia- replete gently given AKI 9. Hypocalcemia- repleted last night/early this morning. 10. Hyponatremia due to #1 and respiratory distress.  Continue to follow.   Donetta Potts, MD [employer] [phone]

## 2018-01-13 LAB — CBC
HCT: 21.2 % — ABNORMAL LOW (ref 36.0–46.0)
Hemoglobin: 7.5 g/dL — ABNORMAL LOW (ref 12.0–15.0)
MCH: 29.4 pg (ref 26.0–34.0)
MCHC: 35.4 g/dL (ref 30.0–36.0)
MCV: 83.1 fL (ref 78.0–100.0)
PLATELETS: 39 10*3/uL — AB (ref 150–400)
RBC: 2.55 MIL/uL — ABNORMAL LOW (ref 3.87–5.11)
RDW: 14.7 % (ref 11.5–15.5)
WBC: 30.4 10*3/uL — ABNORMAL HIGH (ref 4.0–10.5)

## 2018-01-13 LAB — COMPREHENSIVE METABOLIC PANEL
ALK PHOS: 148 U/L — AB (ref 38–126)
ALT: 549 U/L — ABNORMAL HIGH (ref 0–44)
AST: 554 U/L — AB (ref 15–41)
Albumin: 1.8 g/dL — ABNORMAL LOW (ref 3.5–5.0)
Anion gap: 12 (ref 5–15)
BUN: 73 mg/dL — AB (ref 8–23)
CALCIUM: 6.5 mg/dL — AB (ref 8.9–10.3)
CHLORIDE: 107 mmol/L (ref 98–111)
CO2: 16 mmol/L — AB (ref 22–32)
Creatinine, Ser: 4.38 mg/dL — ABNORMAL HIGH (ref 0.44–1.00)
GFR calc Af Amer: 11 mL/min — ABNORMAL LOW (ref >60)
GFR calc non Af Amer: 10 mL/min — ABNORMAL LOW (ref >60)
GLUCOSE: 102 mg/dL — AB (ref 70–99)
Potassium: 3.2 mmol/L — ABNORMAL LOW (ref 3.5–5.1)
SODIUM: 135 mmol/L (ref 135–145)
Total Bilirubin: 2 mg/dL — ABNORMAL HIGH (ref 0.3–1.2)
Total Protein: 4.6 g/dL — ABNORMAL LOW (ref 6.5–8.1)

## 2018-01-13 LAB — DIFFERENTIAL
BASOS PCT: 0 %
Basophils Absolute: 0 10*3/uL (ref 0.0–0.1)
EOS PCT: 0 %
Eosinophils Absolute: 0 10*3/uL (ref 0.0–0.7)
Lymphocytes Relative: 6 %
Lymphs Abs: 1.8 10*3/uL (ref 0.7–4.0)
MONO ABS: 1.2 10*3/uL — AB (ref 0.1–1.0)
Monocytes Relative: 4 %
NEUTROS ABS: 27.4 10*3/uL — AB (ref 1.7–7.7)
Neutrophils Relative %: 90 %

## 2018-01-13 LAB — CULTURE, BLOOD (ROUTINE X 2)
SPECIAL REQUESTS: ADEQUATE
Special Requests: ADEQUATE

## 2018-01-13 LAB — PROCALCITONIN: PROCALCITONIN: 34.84 ng/mL

## 2018-01-13 MED ORDER — POTASSIUM CHLORIDE 10 MEQ/100ML IV SOLN
INTRAVENOUS | Status: AC
  Filled 2018-01-13: qty 100

## 2018-01-13 MED ORDER — POTASSIUM CHLORIDE 10 MEQ/100ML IV SOLN
10.0000 meq | INTRAVENOUS | Status: AC
  Administered 2018-01-13 (×3): 10 meq via INTRAVENOUS
  Filled 2018-01-13 (×2): qty 100

## 2018-01-13 NOTE — Progress Notes (Signed)
Referring Physician(s): Alva,R  Supervising Physician: Aletta Edouard  Patient Status:  [facility] - In-pt  Chief Complaint: Abdominal pain, hepatic mass/abscess   Subjective: Pt tired; also has some right abd discomfort with deep inspiration; no chest pain/cough/N/V or bleeding   Allergies: Patient has no known allergies.  Medications: Prior to Admission medications   Medication Sig Start Date End Date Taking? Authorizing Provider  cholecalciferol (VITAMIN D) 1000 units tablet Take 1,000 Units by mouth daily.   Yes [provider]  lisinopril-hydrochlorothiazide (PRINZIDE,ZESTORETIC) 20-25 MG tablet Take 1 tablet by mouth at bedtime.  07/14/16  Yes [provider]  ondansetron (ZOFRAN-ODT) 8 MG disintegrating tablet Take 8 mg by mouth every 8 (eight) hours as needed for nausea or vomiting.   Yes [provider]  phenazopyridine (PYRIDIUM) 200 MG tablet Take 200 mg by mouth 3 (three) times daily as needed for pain.   Yes [provider]  TURMERIC PO Take 2 capsules by mouth at bedtime.   Yes [provider]     Vital Signs: BP 120/72 (BP Location: Left Arm)   Pulse 76   Temp 98.2 F (36.8 C) (Oral)   Resp 12   Ht 6' (1.829 m)   Wt 222 lb 14.4 oz (101.1 kg)   LMP  (LMP Unknown)   SpO2 97%   BMI 30.23 kg/m   Physical Exam hepatic drain in place; dressing dry, site mildly tender, output 100 cc bloody fluid; cyt/cx pend  Imaging: US Guided Needle Placement  Result Date: 01/11/2018 INDICATION: Heterogeneous mass versus abscess of the liver with liquefied component. Presentation with abdominal pain and sepsis with positive blood cultures. EXAM: 1. ULTRASOUND-GUIDED CORE BIOPSY LIVER 2. ULTRASOUND GUIDED PERCUTANEOUS CATHETER DRAINAGE OF LIVER MEDICATIONS: The patient is currently admitted to the hospital and receiving intravenous antibiotics. The antibiotics were administered within an appropriate time frame prior to the initiation of  the procedure. ANESTHESIA/SEDATION: Fentanyl 75 mcg IV; Versed 1.5 mg IV Moderate Sedation Time:  31 minutes. The patient was continuously monitored during the procedure by the interventional radiology nurse under my direct supervision. COMPLICATIONS: None immediate. PROCEDURE: Informed written consent was obtained from the patient after a thorough discussion of the procedural risks, benefits and alternatives. All questions were addressed. Maximal Sterile Barrier Technique was utilized including caps, mask, sterile gowns, sterile gloves, sterile drape, hand hygiene and skin antiseptic. A timeout was performed prior to the initiation of the procedure. Ultrasound was performed of the liver. Under ultrasound guidance, a 17 gauge needle was advanced into the right lobe. 18 gauge core biopsy samples were obtained with 3 samples submitted in formalin. The 17 gauge needle was then advanced into a liquified component. Aspiration of fluid was performed and sample sent for both culture analysis and cytology. A guidewire was advanced through the needle. The tract was dilated and a 10 French percutaneous drainage catheter advanced. Catheter position was confirmed by ultrasound. The catheter was connected to a suction bulb. The drainage catheter was secured at the skin with a Prolene retention suture and adhesive StatLock device. FINDINGS: Large heterogeneous abnormality of the liver is present with ill-defined borders. The area of parenchymal abnormality spans at least an area 11 cm. The echogenic rim surrounding a liquified component was sampled with tissue biopsy. Aspiration at the level of the liquefied component yielded bloody and turbid fluid. A 10 French drain was placed in this collection which is draining bloody fluid. IMPRESSION: Ultrasound-guided biopsy of the liver lesion along the edge of  the liquefied component followed by aspiration of the liquefied component yielding bloody and turbid fluid which was sent for  culture analysis and cytology. Additional placement of a 10 French percutaneous drainage catheter into the liquefied component which was attached to suction bulb drainage. Electronically Signed   By: Aletta Edouard M.D.   On: 01/11/2018 17:15   Mr Abdomen Mrcp Wo Contrast  Result Date: 01/10/2018 CLINICAL DATA:  Abdominal pain. 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 EXAM: MRI ABDOMEN WITHOUT CONTRAST  (INCLUDING MRCP) TECHNIQUE: Multiplanar multisequence MR imaging of the abdomen was performed. Heavily T2-weighted images of the biliary and pancreatic ducts were obtained, and three-dimensional MRCP images were rendered by post processing. COMPARISON:  01/09/2018 FINDINGS: Lower chest: Small bilateral pleural effusions identified. Hepatobiliary: There is a large heterogeneous mass involving much of the entirety of segment 4. The mass measures 10.2 by 10.5 by 9.4 cm, image 14/6. A second smaller lesion is identified within the periphery of segment 7 measuring 2.7 cm, image 29/2. The common bile duct has a normal caliber. Pericholecystic fluid is identified. No gallstones identified. Pancreas: Within the limitations of unenhanced technique no pancreas abnormality identified. No main duct dilatation or mass. Spleen:  Normal appearance of the spleen. Adrenals/Urinary Tract: Normal appearance of the adrenal glands. No kidney mass or hydronephrosis identified. Stomach/Bowel: The stomach is normal. The visualized bowel loops within the upper abdomen are unremarkable. Vascular/Lymphatic: Normal appearance of the abdominal aorta. No retroperitoneal adenopathy. Other: There is a small amount of free fluid identified within the upper abdomen. Musculoskeletal: No suspicious bone lesions identified. IMPRESSION: 1. Examination confirms presence of large heterogeneous mass involving almost the entire segment 4 of the liver. A smaller lesion is identified within the lateral aspect of segment 7. Imaging findings are compatible  with either primary neoplasm of the liver such as cholangiocarcinoma or metastatic disease. The dominant mass should be easily amendable to image guided percutaneous biopsy. Additionally, consider further investigation with PET-CT to assess for distant metastatic disease. 2. Small pleural effusions. Electronically Signed   By: Kerby Moors M.D.   On: 01/10/2018 10:25   Mr 3d Recon At Scanner  Result Date: 01/10/2018 CLINICAL DATA:  Abdominal pain. 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 EXAM: MRI ABDOMEN WITHOUT CONTRAST  (INCLUDING MRCP) TECHNIQUE: Multiplanar multisequence MR imaging of the abdomen was performed. Heavily T2-weighted images of the biliary and pancreatic ducts were obtained, and three-dimensional MRCP images were rendered by post processing. COMPARISON:  01/09/2018 FINDINGS: Lower chest: Small bilateral pleural effusions identified. Hepatobiliary: There is a large heterogeneous mass involving much of the entirety of segment 4. The mass measures 10.2 by 10.5 by 9.4 cm, image 14/6. A second smaller lesion is identified within the periphery of segment 7 measuring 2.7 cm, image 29/2. The common bile duct has a normal caliber. Pericholecystic fluid is identified. No gallstones identified. Pancreas: Within the limitations of unenhanced technique no pancreas abnormality identified. No main duct dilatation or mass. Spleen:  Normal appearance of the spleen. Adrenals/Urinary Tract: Normal appearance of the adrenal glands. No kidney mass or hydronephrosis identified. Stomach/Bowel: The stomach is normal. The visualized bowel loops within the upper abdomen are unremarkable. Vascular/Lymphatic: Normal appearance of the abdominal aorta. No retroperitoneal adenopathy. Other: There is a small amount of free fluid identified within the upper abdomen. Musculoskeletal: No suspicious bone lesions identified. IMPRESSION: 1. Examination confirms presence of large heterogeneous mass involving almost the entire  segment 4 of the liver. A smaller lesion is identified within the lateral aspect of segment 7. Imaging findings are  compatible with either primary neoplasm of the liver such as cholangiocarcinoma or metastatic disease. The dominant mass should be easily amendable to image guided percutaneous biopsy. Additionally, consider further investigation with PET-CT to assess for distant metastatic disease. 2. Small pleural effusions. Electronically Signed   By: Kerby Moors M.D.   On: 01/10/2018 10:25   US Biopsy (liver)  Result Date: 01/11/2018 INDICATION: Heterogeneous mass versus abscess of the liver with liquefied component. Presentation with abdominal pain and sepsis with positive blood cultures. EXAM: 1. ULTRASOUND-GUIDED CORE BIOPSY LIVER 2. ULTRASOUND GUIDED PERCUTANEOUS CATHETER DRAINAGE OF LIVER MEDICATIONS: The patient is currently admitted to the hospital and receiving intravenous antibiotics. The antibiotics were administered within an appropriate time frame prior to the initiation of the procedure. ANESTHESIA/SEDATION: Fentanyl 75 mcg IV; Versed 1.5 mg IV Moderate Sedation Time:  31 minutes. The patient was continuously monitored during the procedure by the interventional radiology nurse under my direct supervision. COMPLICATIONS: None immediate. PROCEDURE: Informed written consent was obtained from the patient after a thorough discussion of the procedural risks, benefits and alternatives. All questions were addressed. Maximal Sterile Barrier Technique was utilized including caps, mask, sterile gowns, sterile gloves, sterile drape, hand hygiene and skin antiseptic. A timeout was performed prior to the initiation of the procedure. Ultrasound was performed of the liver. Under ultrasound guidance, a 17 gauge needle was advanced into the right lobe. 18 gauge core biopsy samples were obtained with 3 samples submitted in formalin. The 17 gauge needle was then advanced into a liquified component. Aspiration of  fluid was performed and sample sent for both culture analysis and cytology. A guidewire was advanced through the needle. The tract was dilated and a 10 French percutaneous drainage catheter advanced. Catheter position was confirmed by ultrasound. The catheter was connected to a suction bulb. The drainage catheter was secured at the skin with a Prolene retention suture and adhesive StatLock device. FINDINGS: Large heterogeneous abnormality of the liver is present with ill-defined borders. The area of parenchymal abnormality spans at least an area 11 cm. The echogenic rim surrounding a liquified component was sampled with tissue biopsy. Aspiration at the level of the liquefied component yielded bloody and turbid fluid. A 10 French drain was placed in this collection which is draining bloody fluid. IMPRESSION: Ultrasound-guided biopsy of the liver lesion along the edge of the liquefied component followed by aspiration of the liquefied component yielding bloody and turbid fluid which was sent for culture analysis and cytology. Additional placement of a 10 French percutaneous drainage catheter into the liquefied component which was attached to suction bulb drainage. Electronically Signed   By: Aletta Edouard M.D.   On: 01/11/2018 17:15   Dg Chest Port 1 View  Result Date: 01/11/2018 CLINICAL DATA:  Dyspnea. EXAM: PORTABLE CHEST 1 VIEW COMPARISON:  Radiograph of January 09, 2018. FINDINGS: Stable cardiomediastinal silhouette. No pneumothorax is noted. Left lung is clear. Right-sided PICC line is noted with distal tip in expected position of SVC. Mild right basilar opacity is noted concerning for atelectasis or infiltrate with associated pleural effusion. Bony thorax is unremarkable. IMPRESSION: Interval development of mild right basilar opacity concerning for atelectasis or infiltrate with possible associated pleural effusion. Interval placement of right-sided PICC line with distal tip in expected position of SVC.  Electronically Signed   By: Marijo Conception, M.D.   On: 01/11/2018 15:27   Dg Chest Port 1 View  Result Date: 01/09/2018 CLINICAL DATA:  Fever, nausea and body aches for 6 days. EXAM:  PORTABLE CHEST 1 VIEW COMPARISON:  January 09, 2018 11:20 a.m. FINDINGS: The mediastinal contour is normal. The heart size is mildly enlarged. The lung volumes are low. There is no focal pneumonia, pulmonary edema, or pleural effusion. The visualized skeletal structures are unremarkable. IMPRESSION: Mild enlarged cardiac silhouette. Low lung volumes. No focal pneumonia or frank pulmonary edema is noted. Electronically Signed   By: Abelardo Diesel M.D.   On: 01/09/2018 16:10   [place] Ekg Site Rite  Result Date: 01/11/2018 If Site Rite image not attached, placement could not be confirmed due to current cardiac rhythm.   Labs:  CBC: Recent Labs    01/11/18 0356 01/11/18 1829 01/12/18 0258 01/13/18 0400  WBC 14.9* 18.3* 23.0* 30.4*  HGB 10.6* 9.3* 8.1* 7.5*  HCT 31.3* 27.2* 24.1* 21.2*  PLT 54* 59* 42* 39*    COAGS: Recent Labs    01/09/18 1151  INR 1.24    BMP: Recent Labs    01/11/18 0356 01/11/18 2144 01/12/18 0258 01/13/18 0400  NA 133* 133* 133* 135  K 3.4* 3.4* 3.3* 3.2*  CL 101 105 104 107  CO2 17* 17* 18* 16*  GLUCOSE 110* 123* 103* 102*  BUN 63* 70* 70* 73*  CALCIUM 6.7* 6.3* 7.2* 6.5*  CREATININE 3.72* 4.24* 4.27* 4.38*  GFRNONAA 12* 10* 10* 10*  GFRAA 14* 12* 12* 11*    LIVER FUNCTION TESTS: Recent Labs    01/11/18 1105 01/11/18 2144 01/12/18 0258 01/13/18 0400  BILITOT 2.4* 3.1* 3.2* 2.0*  AST 273* 669* 1,080* 554*  ALT 319* 487* 659* 549*  ALKPHOS 70 106 114 148*  PROT 5.1* 4.7* 4.7* 4.6*  ALBUMIN 2.2* 2.1* 2.1* 1.8*    Assessment and Plan: S/p hepatic mass/abscess bx/drainage 8/9; afebrile; creat 4.38(4.27), hgb 7.5(8.1), WBC 30.4(23), plts 39k(42), liver fluid cx pend, hepatic path pend; cont current tx /drain irrigation; obtain f/u CT once drain output minimal or if  clinical status worsens; ID consult   Electronically Signed: D. Rowe Robert, PA-C 01/13/2018, 2:10 PM   I spent a total of 15 minutes at the the patient's bedside AND on the patient's hospital floor or unit, greater than 50% of which was counseling/coordinating care for hepatic mass biopsy/abscess drain    Patient ID: Lisa Bauer, female   DOB: June 09, 1955, 62 y.o.   MRN: [id number]

## 2018-01-13 NOTE — Progress Notes (Addendum)
PROGRESS NOTE    Lisa Bauer  WJX:[id number] DOB: [birth year] DOA: 01/09/2018  PCP: Wenda Low, MD   Brief Narrative:  62 year old lady with prior h/o  comes in for fever, abdominal pain, was found to have hypotension and elevated lactic acid. Further evaluation with a CT abdomen showed heterogenous liver mass. She was initially admitted by [facility] for septic shock and briefly required pressors.  She is off the pressors and transferred to [facility] service on 8/9.    Assessment & Plan:   Septic shock / Streptococcus bacteremia / Leukocytosis - Thought to be due to possible liver abscess - Blood cx growing streptococcus - Underwent drainage placement 8/9 and biopsy, follow up results  - Pt on zosyn but WBC count trending up so will consult ID - Monitor daily CBC - Procalcitonin is trending down and lactic acid yesterday was WNL  Normocytic anemia - Hgb down to 7.5 from 9.3 on 8/9 - Will monitor for now, transfuse for hgb of less than 7  LE swelling - Likely from fluid resuscitation for septic shock - Stop IV fluids and monitor  - Monitor urine output  Acute kidney injury with metabolic acidosis - ATN or pre-renal etiology from septic shock - Per renal this seems to be likely etiology as well - Cr is trending up, continue to monitor  Hypokalemia / Hypomagnesemia and hypophosphatemia - Continue to supplement   Thrombocytopenia - S/P 2 U platelets - Platelets are 39 this am  Heterogenous liver mass - Follow up liver bx results    DVT prophylaxis: SCD's Code Status: full code  Family Communication: husband at bedside this am Disposition Plan: patient is still acute, not stable for discharge    Consultants:   Radiology  Infectious diease  Procedures:   Liver bx  Antimicrobials:   Zosyn 8/8 -->   Subjective: No overnight events.  Objective: Vitals:   01/12/18 1510 01/12/18 2102 01/13/18 0019 01/13/18 0426  BP:  128/90 110/82 132/81  Pulse: 77 90  76   Resp:  20 17 (!) 21  Temp:  98 F (36.7 C) 98.4 F (36.9 C) 98.3 F (36.8 C)  TempSrc:  Oral Oral Oral  SpO2: 97% 97% 96% 99%  Weight:    101.1 kg  Height:        Intake/Output Summary (Last 24 hours) at 01/13/2018 0732 Last data filed at 01/13/2018 0500 Gross per 24 hour  Intake 2982.43 ml  Output 640 ml  Net 2342.43 ml   Filed Weights   01/11/18 0500 01/12/18 0450 01/13/18 0426  Weight: 93.9 kg 98.7 kg 101.1 kg    Examination:  Physical Exam  Constitutional: Appears well-developed and well-nourished. No distress.  HENT: Normocephalic. External right and left ear normal. Oropharynx is clear and moist.  Eyes: Conjunctivae and EOM are normal. PERRLA, no scleral icterus.  Neck: Normal ROM. Neck supple. No JVD. No tracheal deviation. No thyromegaly.  CVS: RRR, S1/S2 + Pulmonary: diminished but no wheezing   Abdominal: Soft. BS +, some tenderness to palpation  Musculoskeletal: Normal range of motion. Edema in LE appreciated   Lymphadenopathy: No lymphadenopathy noted, cervical, inguinal. Neuro: Alert. Normal reflexes, muscle tone coordination. No cranial nerve deficit. Skin: Skin is warm and dry.  Psychiatric: Normal mood and affect. Behavior, judgment, thought content normal.     Data Reviewed: I have personally reviewed following labs and imaging studies  CBC: Recent Labs  Lab 01/09/18 1151 01/09/18 1655 01/11/18 0356 01/11/18 1829 01/12/18 0258 01/13/18 0400  WBC 9.9 12.0* 14.9* 18.3* 23.0* 30.4*  NEUTROABS 8.4*  --   --   --   --   --   HGB 12.3 10.4* 10.6* 9.3* 8.1* 7.5*  HCT 36.6 31.5* 31.3* 27.2* 24.1* 21.2*  MCV 87.6 89.0 84.8 85.3 86.1 83.1  PLT 93* 58* 54* 59* 42* 39*   Basic Metabolic Panel: Recent Labs  Lab 01/09/18 1443  01/10/18 0228 01/11/18 0356 01/11/18 2144 01/12/18 0258 01/13/18 0400  NA  --    < > 134* 133* 133* 133* 135  K  --    < > 2.8* 3.4* 3.4* 3.3* 3.2*  CL  --    < > 99 101 105 104 107  CO2  --    < > 21* 17* 17* 18*  16*  GLUCOSE  --    < > 86 110* 123* 103* 102*  BUN  --    < > 45* 63* 70* 70* 73*  CREATININE  --    < > 3.01* 3.72* 4.24* 4.27* 4.38*  CALCIUM  --    < > 6.8* 6.7* 6.3* 7.2* 6.5*  MG 1.6*  --  1.6* 2.4  --   --   --   PHOS  --   --  2.4* 3.8  --   --   --    < > = values in this interval not displayed.   GFR: Estimated Creatinine Clearance: 17.7 mL/min (A) (by C-G formula based on SCr of 4.38 mg/dL (H)). Liver Function Tests: Recent Labs  Lab 01/09/18 1151 01/11/18 1105 01/11/18 2144 01/12/18 0258 01/13/18 0400  AST 437* 273* 669* 1,080* 554*  ALT 464* 319* 487* 659* 549*  ALKPHOS 86 70 106 114 148*  BILITOT 3.3* 2.4* 3.1* 3.2* 2.0*  PROT 6.1* 5.1* 4.7* 4.7* 4.6*  ALBUMIN 2.9* 2.2* 2.1* 2.1* 1.8*   Recent Labs  Lab 01/09/18 1151  LIPASE 24   No results for input(s): AMMONIA in the last 168 hours. Coagulation Profile: Recent Labs  Lab 01/09/18 1151  INR 1.24   Cardiac Enzymes: No results for input(s): CKTOTAL, CKMB, CKMBINDEX, TROPONINI in the last 168 hours. BNP (last 3 results) No results for input(s): PROBNP in the last 8760 hours. HbA1C: No results for input(s): HGBA1C in the last 72 hours. CBG: Recent Labs  Lab 01/09/18 1640 01/10/18 0843 01/10/18 1624  GLUCAP 103* 95 125*   Lipid Profile: No results for input(s): CHOL, HDL, LDLCALC, TRIG, CHOLHDL, LDLDIRECT in the last 72 hours. Thyroid Function Tests: No results for input(s): TSH, T4TOTAL, FREET4, T3FREE, THYROIDAB in the last 72 hours. Anemia Panel: Recent Labs    01/11/18 0930 01/11/18 1105  VITAMINB12  --  3,227*  FOLATE 14.5  --   FERRITIN  --  487*  TIBC  --  158*  IRON  --  15*  RETICCTPCT  --  0.6   Urine analysis:    Component Value Date/Time   COLORURINE AMBER (A) 01/11/2018 0024   APPEARANCEUR CLOUDY (A) 01/11/2018 0024   LABSPEC 1.018 01/11/2018 0024   PHURINE 5.0 01/11/2018 0024   GLUCOSEU NEGATIVE 01/11/2018 0024   HGBUR MODERATE (A) 01/11/2018 0024   BILIRUBINUR  NEGATIVE 01/11/2018 0024   KETONESUR NEGATIVE 01/11/2018 0024   PROTEINUR 100 (A) 01/11/2018 0024   NITRITE NEGATIVE 01/11/2018 0024   LEUKOCYTESUR TRACE (A) 01/11/2018 0024   Sepsis Labs: @LABRCNTIP (procalcitonin:4,lacticidven:4)   ) Recent Results (from the past 240 hour(s))  Blood Culture (routine x 2)     Status:  Abnormal (Preliminary result)   Collection Time: 01/09/18 11:40 AM  Result Value Ref Range Status   Specimen Description BLOOD RIGHT ANTECUBITAL  Final   Special Requests   Final    BOTTLES DRAWN AEROBIC AND ANAEROBIC Blood Culture adequate volume   Culture  Setup Time   Final    GRAM POSITIVE COCCI IN CHAINS IN BOTH AEROBIC AND ANAEROBIC BOTTLES CRITICAL RESULT CALLED TO, READ BACK BY AND VERIFIED WITH: Laurice Record PharmD 16:10 01/10/18 ([email or web address]) Performed at [facility], [address] [place], [place] [address]    Culture STREPTOCOCCUS CONSTELLATUS (A)  Final   Report Status PENDING  Incomplete  Blood Culture ID Panel (Reflexed)     Status: Abnormal   Collection Time: 01/09/18 11:40 AM  Result Value Ref Range Status   Enterococcus species NOT DETECTED NOT DETECTED Final   Listeria monocytogenes NOT DETECTED NOT DETECTED Final   Staphylococcus species NOT DETECTED NOT DETECTED Final   Staphylococcus aureus NOT DETECTED NOT DETECTED Final   Streptococcus species DETECTED (A) NOT DETECTED Final    Comment: Not Enterococcus species, Streptococcus agalactiae, Streptococcus pyogenes, or Streptococcus pneumoniae. CRITICAL RESULT CALLED TO, READ BACK BY AND VERIFIED WITH: Laurice Record PharmD 16:10 01/10/18 ([email or web address])    Streptococcus agalactiae NOT DETECTED NOT DETECTED Final   Streptococcus pneumoniae NOT DETECTED NOT DETECTED Final   Streptococcus pyogenes NOT DETECTED NOT DETECTED Final   Acinetobacter baumannii NOT DETECTED NOT DETECTED Final   Enterobacteriaceae species NOT DETECTED NOT DETECTED Final   Enterobacter cloacae complex NOT DETECTED NOT DETECTED Final    Escherichia coli NOT DETECTED NOT DETECTED Final   Klebsiella oxytoca NOT DETECTED NOT DETECTED Final   Klebsiella pneumoniae NOT DETECTED NOT DETECTED Final   Proteus species NOT DETECTED NOT DETECTED Final   Serratia marcescens NOT DETECTED NOT DETECTED Final   Haemophilus influenzae NOT DETECTED NOT DETECTED Final   Neisseria meningitidis NOT DETECTED NOT DETECTED Final   Pseudomonas aeruginosa NOT DETECTED NOT DETECTED Final   Candida albicans NOT DETECTED NOT DETECTED Final   Candida glabrata NOT DETECTED NOT DETECTED Final   Candida krusei NOT DETECTED NOT DETECTED Final   Candida parapsilosis NOT DETECTED NOT DETECTED Final   Candida tropicalis NOT DETECTED NOT DETECTED Final    Comment: Performed at [facility], [place] [address], [place], [place] [address]  Blood Culture (routine x 2)     Status: Abnormal (Preliminary result)   Collection Time: 01/09/18 11:50 AM  Result Value Ref Range Status   Specimen Description BLOOD LEFT ANTECUBITAL  Final   Special Requests   Final    BOTTLES DRAWN AEROBIC AND ANAEROBIC Blood Culture adequate volume   Culture  Setup Time   Final    GRAM POSITIVE COCCI IN CHAINS ANAEROBIC BOTTLE ONLY CRITICAL VALUE NOTED.  VALUE IS CONSISTENT WITH PREVIOUSLY REPORTED AND CALLED VALUE. Performed at [facility], [place] [address] [place],  [address]    Culture STREPTOCOCCUS INTERMEDIUS (A)  Final   Report Status PENDING  Incomplete  MRSA PCR Screening     Status: None   Collection Time: 01/09/18  4:31 PM  Result Value Ref Range Status   MRSA by PCR NEGATIVE NEGATIVE Final    Comment:        The GeneXpert MRSA Assay (FDA approved for NASAL specimens only), is one component of a comprehensive MRSA colonization surveillance program. It is not intended to diagnose MRSA infection nor to guide or monitor treatment for MRSA infections.  Performed at [facility], [place] [address], [place], [place] [address]   Aerobic/Anaerobic  Culture (surgical/deep wound)     Status: None (Preliminary result)   Collection Time: 01/11/18  2:27 PM  Result Value Ref Range Status   Specimen Description ABSCESS  Final   Special Requests Normal  Final   Gram Stain   Final    MODERATE WBC PRESENT, PREDOMINANTLY PMN MODERATE GRAM POSITIVE COCCI    Culture   Final    NO GROWTH < 24 HOURS Performed at [facility], [place] [address], [place], [place] [address]    Report Status PENDING  Incomplete  Culture, blood (routine x 2)     Status: None (Preliminary result)   Collection Time: 01/11/18  9:29 PM  Result Value Ref Range Status   Specimen Description BLOOD BLOOD LEFT FOREARM  Final   Special Requests   Final    BOTTLES DRAWN AEROBIC ONLY Blood Culture results may not be optimal due to an inadequate volume of blood received in culture bottles   Culture   Final    NO GROWTH < 12 HOURS Performed at [facility], [place] [address], [place], [place] [address]    Report Status PENDING  Incomplete      Radiology Studies: Ct Abdomen Pelvis Wo Contrast  Result Date: 01/09/2018 CLINICAL DATA:  62 year old female with a history of abdominal pain with nausea and vomiting EXAM: CT ABDOMEN AND PELVIS WITHOUT CONTRAST TECHNIQUE: Multidetector CT imaging of the abdomen and pelvis was performed following the standard protocol without IV contrast. COMPARISON:  09/24/2016 FINDINGS: Lower chest: No pleural effusion or confluent airspace disease. Hepatobiliary: Heterogeneously attenuating mass of the liver, prominently within segment 4, though extending into the right liver, segment 2 and segment 3. Estimated diameter on the axial images 9.5 cm x 8.7 cm. Additional nodule/mass within the periphery of the right liver on image 19. Gallbladder is relatively decompressed.  No radiopaque gallstones. Pancreas: Unremarkable appearance of pancreas Spleen: Unremarkable spleen Adrenals/Urinary Tract: Unremarkable adrenal glands. Bilateral kidneys without  hydronephrosis or nephrolithiasis. Unremarkable course the bilateral ureters. Urinary bladder is relatively decompressed. Stomach/Bowel: Stomach unremarkable. No abnormally distended small bowel. No transition point. No focal inflammatory changes of the small bowel mesentery. Normal appendix. Colon is decompressed. Vascular/Lymphatic: No significant vascular calcifications. No mesenteric, retroperitoneal, or inguinal adenopathy. Reproductive: Fibroid changes of the uterus. Other: No large ventral wall hernia. Musculoskeletal: Grade 1 anterolisthesis of L4 on L5. Vacuum disc phenomenon at L4-L5 and L5-S1. No bony canal narrowing. Degenerative changes of the facets. Mild degenerative changes of the hips. No acute fracture IMPRESSION: Heterogeneously attenuating mass within the liver, predominantly segment 4 measuring 9.5 cm by 8.7 cm. Differential includes abscess, primary liver tumor/biliary tumor, or metastases. Additional small pathologic focus within the lateral aspect of the right liver measuring approximately 2.6 cm. These results were called by telephone at the time of interpretation on 01/09/2018 at 2:25 pm to Dr. Lajean Saver , who verbally acknowledged these results. Fibroid changes of the uterus. Electronically Signed   By: Corrie Mckusick D.O.   On: 01/09/2018 14:28   Dg Chest 2 View  Result Date: 01/09/2018 CLINICAL DATA:  Fever, weakness and dizziness over the last 3 days. EXAM: CHEST - 2 VIEW COMPARISON:  09/23/2017 FINDINGS: Heart and mediastinal shadows are normal. Lung volumes are diminished which could be due to a poor inspiration for underlying lung disease. Lung markings appear more prominent diffusely, which again could relate to the  poor inspiration or could indicate mild diffuse pneumonitis. No dense consolidation or lobar collapse. No effusions. No significant bone finding. IMPRESSION: Poor inspiration versus widespread pneumonitis. No dense consolidation or collapse. Electronically Signed    By: Nelson Chimes M.D.   On: 01/09/2018 11:29   US Guided Needle Placement  Result Date: 01/11/2018 INDICATION: Heterogeneous mass versus abscess of the liver with liquefied component. Presentation with abdominal pain and sepsis with positive blood cultures. EXAM: 1. ULTRASOUND-GUIDED CORE BIOPSY LIVER 2. ULTRASOUND GUIDED PERCUTANEOUS CATHETER DRAINAGE OF LIVER MEDICATIONS: The patient is currently admitted to the hospital and receiving intravenous antibiotics. The antibiotics were administered within an appropriate time frame prior to the initiation of the procedure. ANESTHESIA/SEDATION: Fentanyl 75 mcg IV; Versed 1.5 mg IV Moderate Sedation Time:  31 minutes. The patient was continuously monitored during the procedure by the interventional radiology nurse under my direct supervision. COMPLICATIONS: None immediate. PROCEDURE: Informed written consent was obtained from the patient after a thorough discussion of the procedural risks, benefits and alternatives. All questions were addressed. Maximal Sterile Barrier Technique was utilized including caps, mask, sterile gowns, sterile gloves, sterile drape, hand hygiene and skin antiseptic. A timeout was performed prior to the initiation of the procedure. Ultrasound was performed of the liver. Under ultrasound guidance, a 17 gauge needle was advanced into the right lobe. 18 gauge core biopsy samples were obtained with 3 samples submitted in formalin. The 17 gauge needle was then advanced into a liquified component. Aspiration of fluid was performed and sample sent for both culture analysis and cytology. A guidewire was advanced through the needle. The tract was dilated and a 10 French percutaneous drainage catheter advanced. Catheter position was confirmed by ultrasound. The catheter was connected to a suction bulb. The drainage catheter was secured at the skin with a Prolene retention suture and adhesive StatLock device. FINDINGS: Large heterogeneous abnormality of  the liver is present with ill-defined borders. The area of parenchymal abnormality spans at least an area 11 cm. The echogenic rim surrounding a liquified component was sampled with tissue biopsy. Aspiration at the level of the liquefied component yielded bloody and turbid fluid. A 10 French drain was placed in this collection which is draining bloody fluid. IMPRESSION: Ultrasound-guided biopsy of the liver lesion along the edge of the liquefied component followed by aspiration of the liquefied component yielding bloody and turbid fluid which was sent for culture analysis and cytology. Additional placement of a 10 French percutaneous drainage catheter into the liquefied component which was attached to suction bulb drainage. Electronically Signed   By: Aletta Edouard M.D.   On: 01/11/2018 17:15   Mr Abdomen Mrcp Wo Contrast  Result Date: 01/10/2018 CLINICAL DATA:  Abdominal pain. 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 EXAM: MRI ABDOMEN WITHOUT CONTRAST  (INCLUDING MRCP) TECHNIQUE: Multiplanar multisequence MR imaging of the abdomen was performed. Heavily T2-weighted images of the biliary and pancreatic ducts were obtained, and three-dimensional MRCP images were rendered by post processing. COMPARISON:  01/09/2018 FINDINGS: Lower chest: Small bilateral pleural effusions identified. Hepatobiliary: There is a large heterogeneous mass involving much of the entirety of segment 4. The mass measures 10.2 by 10.5 by 9.4 cm, image 14/6. A second smaller lesion is identified within the periphery of segment 7 measuring 2.7 cm, image 29/2. The common bile duct has a normal caliber. Pericholecystic fluid is identified. No gallstones identified. Pancreas: Within the limitations of unenhanced technique no pancreas abnormality identified. No main duct dilatation or mass. Spleen:  Normal appearance of the spleen. Adrenals/Urinary  Tract: Normal appearance of the adrenal glands. No kidney mass or hydronephrosis identified.  Stomach/Bowel: The stomach is normal. The visualized bowel loops within the upper abdomen are unremarkable. Vascular/Lymphatic: Normal appearance of the abdominal aorta. No retroperitoneal adenopathy. Other: There is a small amount of free fluid identified within the upper abdomen. Musculoskeletal: No suspicious bone lesions identified. IMPRESSION: 1. Examination confirms presence of large heterogeneous mass involving almost the entire segment 4 of the liver. A smaller lesion is identified within the lateral aspect of segment 7. Imaging findings are compatible with either primary neoplasm of the liver such as cholangiocarcinoma or metastatic disease. The dominant mass should be easily amendable to image guided percutaneous biopsy. Additionally, consider further investigation with PET-CT to assess for distant metastatic disease. 2. Small pleural effusions. Electronically Signed   By: Kerby Moors M.D.   On: 01/10/2018 10:25   Mr 3d Recon At Scanner  Result Date: 01/10/2018 CLINICAL DATA:  Abdominal pain. 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 EXAM: MRI ABDOMEN WITHOUT CONTRAST  (INCLUDING MRCP) TECHNIQUE: Multiplanar multisequence MR imaging of the abdomen was performed. Heavily T2-weighted images of the biliary and pancreatic ducts were obtained, and three-dimensional MRCP images were rendered by post processing. COMPARISON:  01/09/2018 FINDINGS: Lower chest: Small bilateral pleural effusions identified. Hepatobiliary: There is a large heterogeneous mass involving much of the entirety of segment 4. The mass measures 10.2 by 10.5 by 9.4 cm, image 14/6. A second smaller lesion is identified within the periphery of segment 7 measuring 2.7 cm, image 29/2. The common bile duct has a normal caliber. Pericholecystic fluid is identified. No gallstones identified. Pancreas: Within the limitations of unenhanced technique no pancreas abnormality identified. No main duct dilatation or mass. Spleen:  Normal appearance  of the spleen. Adrenals/Urinary Tract: Normal appearance of the adrenal glands. No kidney mass or hydronephrosis identified. Stomach/Bowel: The stomach is normal. The visualized bowel loops within the upper abdomen are unremarkable. Vascular/Lymphatic: Normal appearance of the abdominal aorta. No retroperitoneal adenopathy. Other: There is a small amount of free fluid identified within the upper abdomen. Musculoskeletal: No suspicious bone lesions identified. IMPRESSION: 1. Examination confirms presence of large heterogeneous mass involving almost the entire segment 4 of the liver. A smaller lesion is identified within the lateral aspect of segment 7. Imaging findings are compatible with either primary neoplasm of the liver such as cholangiocarcinoma or metastatic disease. The dominant mass should be easily amendable to image guided percutaneous biopsy. Additionally, consider further investigation with PET-CT to assess for distant metastatic disease. 2. Small pleural effusions. Electronically Signed   By: Kerby Moors M.D.   On: 01/10/2018 10:25   US Biopsy (liver)  Result Date: 01/11/2018 INDICATION: Heterogeneous mass versus abscess of the liver with liquefied component. Presentation with abdominal pain and sepsis with positive blood cultures. EXAM: 1. ULTRASOUND-GUIDED CORE BIOPSY LIVER 2. ULTRASOUND GUIDED PERCUTANEOUS CATHETER DRAINAGE OF LIVER MEDICATIONS: The patient is currently admitted to the hospital and receiving intravenous antibiotics. The antibiotics were administered within an appropriate time frame prior to the initiation of the procedure. ANESTHESIA/SEDATION: Fentanyl 75 mcg IV; Versed 1.5 mg IV Moderate Sedation Time:  31 minutes. The patient was continuously monitored during the procedure by the interventional radiology nurse under my direct supervision. COMPLICATIONS: None immediate. PROCEDURE: Informed written consent was obtained from the patient after a thorough discussion of the  procedural risks, benefits and alternatives. All questions were addressed. Maximal Sterile Barrier Technique was utilized including caps, mask, sterile gowns, sterile gloves, sterile drape, hand hygiene and skin antiseptic.  A timeout was performed prior to the initiation of the procedure. Ultrasound was performed of the liver. Under ultrasound guidance, a 17 gauge needle was advanced into the right lobe. 18 gauge core biopsy samples were obtained with 3 samples submitted in formalin. The 17 gauge needle was then advanced into a liquified component. Aspiration of fluid was performed and sample sent for both culture analysis and cytology. A guidewire was advanced through the needle. The tract was dilated and a 10 French percutaneous drainage catheter advanced. Catheter position was confirmed by ultrasound. The catheter was connected to a suction bulb. The drainage catheter was secured at the skin with a Prolene retention suture and adhesive StatLock device. FINDINGS: Large heterogeneous abnormality of the liver is present with ill-defined borders. The area of parenchymal abnormality spans at least an area 11 cm. The echogenic rim surrounding a liquified component was sampled with tissue biopsy. Aspiration at the level of the liquefied component yielded bloody and turbid fluid. A 10 French drain was placed in this collection which is draining bloody fluid. IMPRESSION: Ultrasound-guided biopsy of the liver lesion along the edge of the liquefied component followed by aspiration of the liquefied component yielding bloody and turbid fluid which was sent for culture analysis and cytology. Additional placement of a 10 French percutaneous drainage catheter into the liquefied component which was attached to suction bulb drainage. Electronically Signed   By: Aletta Edouard M.D.   On: 01/11/2018 17:15   Dg Chest Port 1 View  Result Date: 01/11/2018 CLINICAL DATA:  Dyspnea. EXAM: PORTABLE CHEST 1 VIEW COMPARISON:  Radiograph  of January 09, 2018. FINDINGS: Stable cardiomediastinal silhouette. No pneumothorax is noted. Left lung is clear. Right-sided PICC line is noted with distal tip in expected position of SVC. Mild right basilar opacity is noted concerning for atelectasis or infiltrate with associated pleural effusion. Bony thorax is unremarkable. IMPRESSION: Interval development of mild right basilar opacity concerning for atelectasis or infiltrate with possible associated pleural effusion. Interval placement of right-sided PICC line with distal tip in expected position of SVC. Electronically Signed   By: Marijo Conception, M.D.   On: 01/11/2018 15:27   Dg Chest Port 1 View  Result Date: 01/09/2018 CLINICAL DATA:  Fever, nausea and body aches for 6 days. EXAM: PORTABLE CHEST 1 VIEW COMPARISON:  January 09, 2018 11:20 a.m. FINDINGS: The mediastinal contour is normal. The heart size is mildly enlarged. The lung volumes are low. There is no focal pneumonia, pulmonary edema, or pleural effusion. The visualized skeletal structures are unremarkable. IMPRESSION: Mild enlarged cardiac silhouette. Low lung volumes. No focal pneumonia or frank pulmonary edema is noted. Electronically Signed   By: Abelardo Diesel M.D.   On: 01/09/2018 16:10   [place] Ekg Site Rite  Result Date: 01/11/2018 If Site Rite image not attached, placement could not be confirmed due to current cardiac rhythm.       Scheduled Meds: . sodium chloride flush  10-40 mL Intracatheter Q12H  . sodium chloride flush  5 mL Intracatheter Q8H   Continuous Infusions: . sodium chloride Stopped (01/12/18 0453)  . piperacillin-tazobactam (ZOSYN)  IV 3.375 g (01/13/18 0459)     LOS: 4 days    Time spent: 25 minutes Greater than 50% of the time spent on counseling and coordinating the care.   Leisa Lenz, MD [facility] Pager [phone]  If 7PM-7AM, please contact night-coverage [email or web address] Password [facility] 01/13/2018, 7:32 AM

## 2018-01-13 NOTE — Progress Notes (Signed)
[facility] Coordinator will follow pt with ID team to support [facility] needs at [place] if home IV ABX needed.   If patient discharges after hours, please call [phone].   [name] 01/13/2018, 8:30 PM

## 2018-01-13 NOTE — Progress Notes (Signed)
S: Feeling better but admits to feeling swollen O:BP 132/81 (BP Location: Left Arm)   Pulse 76   Temp 98.3 F (36.8 C) (Oral)   Resp (!) 21   Ht 6' (1.829 m)   Wt 101.1 kg   LMP  (LMP Unknown)   SpO2 99%   BMI 30.23 kg/m   Intake/Output Summary (Last 24 hours) at 01/13/2018 1127 Last data filed at 01/13/2018 0500 Gross per 24 hour  Intake 2982.43 ml  Output 490 ml  Net 2492.43 ml   Intake/Output: I/O last 3 completed shifts: In: 5826.7 [P.O.:360; I.V.:5076.5; IV Piggyback:390.2] Out: 840 [Urine:710; Drains:130]  Intake/Output this shift:  No intake/output data recorded. Weight change: 2.449 kg Gen: NAD CVS: no rub Resp: cta with decreased BS at bases Abd: +BS, soft, tenderness to RUQ Ext:1+ edema  Recent Labs  Lab 01/09/18 1151 01/09/18 1655 01/09/18 2011 01/10/18 0228 01/11/18 0356 01/11/18 1105 01/11/18 2144 01/12/18 0258 01/13/18 0400  NA 127*  --  132* 134* 133*  --  133* 133* 135  K 2.8*  --  3.5 2.8* 3.4*  --  3.4* 3.3* 3.2*  CL 87*  --  95* 99 101  --  105 104 107  CO2 25  --  22 21* 17*  --  17* 18* 16*  GLUCOSE 116*  --  101* 86 110*  --  123* 103* 102*  BUN 38*  --  39* 45* 63*  --  70* 70* 73*  CREATININE 2.48* 2.32* 2.43* 3.01* 3.72*  --  4.24* 4.27* 4.38*  ALBUMIN 2.9*  --   --   --   --  2.2* 2.1* 2.1* 1.8*  CALCIUM 7.6*  --  7.0* 6.8* 6.7*  --  6.3* 7.2* 6.5*  PHOS  --   --   --  2.4* 3.8  --   --   --   --   AST 437*  --   --   --   --  273* 669* 1,080* 554*  ALT 464*  --   --   --   --  319* 487* 659* 549*   Liver Function Tests: Recent Labs  Lab 01/11/18 2144 01/12/18 0258 01/13/18 0400  AST 669* 1,080* 554*  ALT 487* 659* 549*  ALKPHOS 106 114 148*  BILITOT 3.1* 3.2* 2.0*  PROT 4.7* 4.7* 4.6*  ALBUMIN 2.1* 2.1* 1.8*   Recent Labs  Lab 01/09/18 1151  LIPASE 24   No results for input(s): AMMONIA in the last 168 hours. CBC: Recent Labs  Lab 01/09/18 1151 01/09/18 1655 01/11/18 0356 01/11/18 1829 01/12/18 0258  01/13/18 0400  WBC 9.9 12.0* 14.9* 18.3* 23.0* 30.4*  NEUTROABS 8.4*  --   --   --   --   --   HGB 12.3 10.4* 10.6* 9.3* 8.1* 7.5*  HCT 36.6 31.5* 31.3* 27.2* 24.1* 21.2*  MCV 87.6 89.0 84.8 85.3 86.1 83.1  PLT 93* 58* 54* 59* 42* 39*   Cardiac Enzymes: No results for input(s): CKTOTAL, CKMB, CKMBINDEX, TROPONINI in the last 168 hours. CBG: Recent Labs  Lab 01/09/18 1640 01/10/18 0843 01/10/18 1624  GLUCAP 103* 95 125*    Iron Studies:  Recent Labs    01/11/18 1105  IRON 15*  TIBC 158*  FERRITIN 487*   Studies/Results: US Guided Needle Placement  Result Date: 01/11/2018 INDICATION: Heterogeneous mass versus abscess of the liver with liquefied component. Presentation with abdominal pain and sepsis with positive blood cultures. EXAM: 1. ULTRASOUND-GUIDED CORE BIOPSY LIVER  2. ULTRASOUND GUIDED PERCUTANEOUS CATHETER DRAINAGE OF LIVER MEDICATIONS: The patient is currently admitted to the hospital and receiving intravenous antibiotics. The antibiotics were administered within an appropriate time frame prior to the initiation of the procedure. ANESTHESIA/SEDATION: Fentanyl 75 mcg IV; Versed 1.5 mg IV Moderate Sedation Time:  31 minutes. The patient was continuously monitored during the procedure by the interventional radiology nurse under my direct supervision. COMPLICATIONS: None immediate. PROCEDURE: Informed written consent was obtained from the patient after a thorough discussion of the procedural risks, benefits and alternatives. All questions were addressed. Maximal Sterile Barrier Technique was utilized including caps, mask, sterile gowns, sterile gloves, sterile drape, hand hygiene and skin antiseptic. A timeout was performed prior to the initiation of the procedure. Ultrasound was performed of the liver. Under ultrasound guidance, a 17 gauge needle was advanced into the right lobe. 18 gauge core biopsy samples were obtained with 3 samples submitted in formalin. The 17 gauge needle was  then advanced into a liquified component. Aspiration of fluid was performed and sample sent for both culture analysis and cytology. A guidewire was advanced through the needle. The tract was dilated and a 10 French percutaneous drainage catheter advanced. Catheter position was confirmed by ultrasound. The catheter was connected to a suction bulb. The drainage catheter was secured at the skin with a Prolene retention suture and adhesive StatLock device. FINDINGS: Large heterogeneous abnormality of the liver is present with ill-defined borders. The area of parenchymal abnormality spans at least an area 11 cm. The echogenic rim surrounding a liquified component was sampled with tissue biopsy. Aspiration at the level of the liquefied component yielded bloody and turbid fluid. A 10 French drain was placed in this collection which is draining bloody fluid. IMPRESSION: Ultrasound-guided biopsy of the liver lesion along the edge of the liquefied component followed by aspiration of the liquefied component yielding bloody and turbid fluid which was sent for culture analysis and cytology. Additional placement of a 10 French percutaneous drainage catheter into the liquefied component which was attached to suction bulb drainage. Electronically Signed   By: Aletta Edouard M.D.   On: 01/11/2018 17:15   US Biopsy (liver)  Result Date: 01/11/2018 INDICATION: Heterogeneous mass versus abscess of the liver with liquefied component. Presentation with abdominal pain and sepsis with positive blood cultures. EXAM: 1. ULTRASOUND-GUIDED CORE BIOPSY LIVER 2. ULTRASOUND GUIDED PERCUTANEOUS CATHETER DRAINAGE OF LIVER MEDICATIONS: The patient is currently admitted to the hospital and receiving intravenous antibiotics. The antibiotics were administered within an appropriate time frame prior to the initiation of the procedure. ANESTHESIA/SEDATION: Fentanyl 75 mcg IV; Versed 1.5 mg IV Moderate Sedation Time:  31 minutes. The patient was  continuously monitored during the procedure by the interventional radiology nurse under my direct supervision. COMPLICATIONS: None immediate. PROCEDURE: Informed written consent was obtained from the patient after a thorough discussion of the procedural risks, benefits and alternatives. All questions were addressed. Maximal Sterile Barrier Technique was utilized including caps, mask, sterile gowns, sterile gloves, sterile drape, hand hygiene and skin antiseptic. A timeout was performed prior to the initiation of the procedure. Ultrasound was performed of the liver. Under ultrasound guidance, a 17 gauge needle was advanced into the right lobe. 18 gauge core biopsy samples were obtained with 3 samples submitted in formalin. The 17 gauge needle was then advanced into a liquified component. Aspiration of fluid was performed and sample sent for both culture analysis and cytology. A guidewire was advanced through the needle. The tract was dilated and a  10 French percutaneous drainage catheter advanced. Catheter position was confirmed by ultrasound. The catheter was connected to a suction bulb. The drainage catheter was secured at the skin with a Prolene retention suture and adhesive StatLock device. FINDINGS: Large heterogeneous abnormality of the liver is present with ill-defined borders. The area of parenchymal abnormality spans at least an area 11 cm. The echogenic rim surrounding a liquified component was sampled with tissue biopsy. Aspiration at the level of the liquefied component yielded bloody and turbid fluid. A 10 French drain was placed in this collection which is draining bloody fluid. IMPRESSION: Ultrasound-guided biopsy of the liver lesion along the edge of the liquefied component followed by aspiration of the liquefied component yielding bloody and turbid fluid which was sent for culture analysis and cytology. Additional placement of a 10 French percutaneous drainage catheter into the liquefied component  which was attached to suction bulb drainage. Electronically Signed   By: Aletta Edouard M.D.   On: 01/11/2018 17:15   Dg Chest Port 1 View  Result Date: 01/11/2018 CLINICAL DATA:  Dyspnea. EXAM: PORTABLE CHEST 1 VIEW COMPARISON:  Radiograph of January 09, 2018. FINDINGS: Stable cardiomediastinal silhouette. No pneumothorax is noted. Left lung is clear. Right-sided PICC line is noted with distal tip in expected position of SVC. Mild right basilar opacity is noted concerning for atelectasis or infiltrate with associated pleural effusion. Bony thorax is unremarkable. IMPRESSION: Interval development of mild right basilar opacity concerning for atelectasis or infiltrate with possible associated pleural effusion. Interval placement of right-sided PICC line with distal tip in expected position of SVC. Electronically Signed   By: Marijo Conception, M.D.   On: 01/11/2018 15:27   . sodium chloride flush  5 mL Intracatheter Q8H    BMET    Component Value Date/Time   NA 135 01/13/2018 0400   K 3.2 (L) 01/13/2018 0400   CL 107 01/13/2018 0400   CO2 16 (L) 01/13/2018 0400   GLUCOSE 102 (H) 01/13/2018 0400   BUN 73 (H) 01/13/2018 0400   CREATININE 4.38 (H) 01/13/2018 0400   CALCIUM 6.5 (L) 01/13/2018 0400   GFRNONAA 10 (L) 01/13/2018 0400   GFRAA 11 (L) 01/13/2018 0400   CBC    Component Value Date/Time   WBC 30.4 (H) 01/13/2018 0400   RBC 2.55 (L) 01/13/2018 0400   HGB 7.5 (L) 01/13/2018 0400   HGB 13.8 12/02/2012 1507   HCT 21.2 (L) 01/13/2018 0400   HCT 40.9 12/02/2012 1507   PLT 39 (L) 01/13/2018 0400   PLT 148 Platelet count consistent in citrate 12/02/2012 1507   MCV 83.1 01/13/2018 0400   MCV 90 12/02/2012 1507   MCH 29.4 01/13/2018 0400   MCHC 35.4 01/13/2018 0400   RDW 14.7 01/13/2018 0400   RDW 13.2 12/02/2012 1507   LYMPHSABS 0.9 01/09/2018 1151   LYMPHSABS 1.6 12/02/2012 1507   MONOABS 0.6 01/09/2018 1151   EOSABS 0.0 01/09/2018 1151   EOSABS 0.1 12/02/2012 1507   BASOSABS 0.0  01/09/2018 1151   BASOSABS 0.0 12/02/2012 1507    Assessment/Plan: 1. AKI- nonoliguric, in setting of sepsis, hypotension, volume depletion and concomitant ACE inhibition. Most consistent with ischemic ATN. Electrolytes stable but volume is up. No indication for HD or CVVHD at this time and will continue to follow closely. s/p foley catheter placement. CT scan without evidence of obstruction or stones.  Creatinine appears to be reaching a plateau.  Hopefully will start to see some improvement over the next 24 hours but  may have to add Lasix if UOP doesn't pick up on its own. 2. Sepsis- blood cultures positive for strep constellatus.Continue with zosynand await sensitivities. Continue with IVF's and add pressors as needed 3. Liver mass/abscess- s/p aspiration with gram positive cocci in pairs, as above and cytology sent by IR. 4. Acute hypoxic Respiratory distress with wheezing- repeat cxr appears to have new infiltrate consistent with possible aspiration pneumonia- abx per PCCM. 5. Anemia of critical illness vs ABLA- following aspiration. Follow H/H and transfuse prn 6. Thrombocytopenia- likely due to DIC. Follow and avoid heparin. 7. Metabolic acidosis due to #1. May need bicarb 8. Hypokalemia- replete gently given AKI 9. Hypocalcemia- repleted 01/12/18. 10. Hyponatremia due to #1, edema, and respiratory distress. Improving as her respiratory distress has improved and her UOP.  Continue to follow.   Lisa Potts, MD [employer] [phone]

## 2018-01-13 NOTE — Consult Note (Addendum)
[facility]  Total days of antibiotics 5        Day 5 piptazo               Reason for Consult: leukocytosis in setting of liver abscess   Referring Physician: devine  Active Problems:   Hepatic abscess   ARF (acute renal failure) (HCC)   Severe sepsis with septic shock (HCC)   Bacteremia due to Streptococcus   Elevated liver function tests   Hypokalemia   Hypomagnesemia   Metabolic acidosis   Anemia   Hypophosphatemia   HPI: Lisa Bauer is a 62 y.o. female with hx of HTN, but otherwise in good state of health, exercises routinely, no recent hospitalization, was admitted on 8/7 with new onset 3 day history of N/V and fevers. Initially thought to have food poisoning. On admit had fever, significant hypotension with sBP in the 70-80s. LA of 3. Wbc of 9 but 24% bands, thrombocytopenia, aki of 2.4 but trended up to 4 thought to be ATN. Infectious work up revealed + blood cx for strep species. 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 IR placed drain on 8/9 cx growing strep species. Despite having drain placed, her wbc continues to trend up, but remains afebrile. It looks like she received 2 doses of hydrocortisone 179m on 8/8. Her isolate is sensitive to penicillin  She is otherwise very healthy, active - per family report  Past Medical History:  Diagnosis Date  . Hypertension     Allergies: No Known Allergies  MEDICATIONS: . sodium chloride flush  5 mL Intracatheter Q8H    Social History   Tobacco Use  . Smoking status: Never Smoker  . Smokeless tobacco: Never Used  Substance Use Topics  . Alcohol use: No  . Drug use: No    History reviewed. No pertinent family history. no liver disease  Review of Systems - abdominal discomfort, fatigue, otherwise 12 point ros is negative   OBJECTIVE: Temp:  [98 F (36.7 C)-98.4 F (36.9 C)] 98.3 F (36.8 C)  (08/11 0426) Pulse Rate:  [76-90] 76 (08/11 0019) Resp:  [17-21] 21 (08/11 0426) BP: (110-132)/(81-90) 132/81 (08/11 0426) SpO2:  [96 %-99 %] 99 % (08/11 0426) Weight:  [101.1 kg] 101.1 kg (08/11 0426) Physical Exam  Constitutional:  oriented to person, place, and time. appears well-developed and well-nourished. No distress.  HENT: [place]/AT, PERRLA, no scleral icterus Mouth/Throat: Oropharynx is clear and moist. No oropharyngeal exudate.  Cardiovascular: Normal rate, regular rhythm and normal heart sounds. Exam reveals no gallop and no friction rub.  No murmur heard.  Pulmonary/Chest: Effort normal and breath sounds normal. No respiratory distress.  has no wheezes.  Neck = supple, no nuchal rigidity Abdominal: Soft. Bowel sounds are normal.  exhibits no distension. There is no tenderness. Right drain with serosanginous blood. Lymphadenopathy: no cervical adenopathy. No axillary adenopathy Neurological: alert and oriented to person, place, and time.  Skin: Skin is warm and dry. No rash noted. No erythema.  Psychiatric: a normal mood and affect.  behavior is normal.    LABS: Results for orders placed or performed during the hospital encounter of 01/09/18 (from the past 48 hour(s))  Aerobic/Anaerobic Culture (surgical/deep wound)     Status: None (Preliminary result)   Collection Time: 01/11/18  2:27 PM  Result Value Ref Range   Specimen Description ABSCESS  Special Requests Normal    Gram Stain      MODERATE WBC PRESENT, PREDOMINANTLY PMN MODERATE GRAM POSITIVE COCCI    Culture      NO GROWTH < 24 HOURS Performed at [facility] [address], [place], [place] [address]    Report Status PENDING   CBC     Status: Abnormal   Collection Time: 01/11/18  6:29 PM  Result Value Ref Range   WBC 18.3 (H) 4.0 - 10.5 K/uL   RBC 3.19 (L) 3.87 - 5.11 MIL/uL   Hemoglobin 9.3 (L) 12.0 - 15.0 g/dL   HCT 27.2 (L) 36.0 - 46.0 %   MCV 85.3 78.0 - 100.0 fL   MCH 29.2 26.0 - 34.0 pg    MCHC 34.2 30.0 - 36.0 g/dL   RDW 14.6 11.5 - 15.5 %   Platelets 59 (L) 150 - 400 K/uL    Comment: CONSISTENT WITH PREVIOUS RESULT Performed at [facility], [place] [address], [place], [place] [address]   Lactic acid, plasma     Status: Abnormal   Collection Time: 01/11/18  6:44 PM  Result Value Ref Range   Lactic Acid, Venous 2.7 (HH) 0.5 - 1.9 mmol/L    Comment: CRITICAL RESULT CALLED TO, READ BACK BY AND VERIFIED WITH: Su Grand 2019 01/11/18 D BRADLEY Performed at [facility], [place] [address], [place], [place] [address]   Culture, blood (routine x 2)     Status: None (Preliminary result)   Collection Time: 01/11/18  9:29 PM  Result Value Ref Range   Specimen Description BLOOD BLOOD LEFT FOREARM    Special Requests      BOTTLES DRAWN AEROBIC ONLY Blood Culture results may not be optimal due to an inadequate volume of blood received in culture bottles   Culture      NO GROWTH < 12 HOURS Performed at [facility] [address] [place], [place] [address]    Report Status PENDING   Comprehensive metabolic panel     Status: Abnormal   Collection Time: 01/11/18  9:44 PM  Result Value Ref Range   Sodium 133 (L) 135 - 145 mmol/L   Potassium 3.4 (L) 3.5 - 5.1 mmol/L   Chloride 105 98 - 111 mmol/L   CO2 17 (L) 22 - 32 mmol/L   Glucose, Bld 123 (H) 70 - 99 mg/dL   BUN 70 (H) 8 - 23 mg/dL   Creatinine, Ser 4.24 (H) 0.44 - 1.00 mg/dL   Calcium 6.3 (LL) 8.9 - 10.3 mg/dL    Comment: CRITICAL RESULT CALLED TO, READ BACK BY AND VERIFIED WITH: Su Grand [id number] WILDERK    Total Protein 4.7 (L) 6.5 - 8.1 g/dL   Albumin 2.1 (L) 3.5 - 5.0 g/dL   AST 669 (H) 15 - 41 U/L   ALT 487 (H) 0 - 44 U/L   Alkaline Phosphatase 106 38 - 126 U/L   Total Bilirubin 3.1 (H) 0.3 - 1.2 mg/dL   GFR calc non Af Amer 10 (L) >60 mL/min   GFR calc Af Amer 12 (L) >60 mL/min    Comment: (NOTE) The eGFR has been calculated using the CKD EPI equation. This calculation has not been  validated in all clinical situations. eGFR's persistently <60 mL/min signify possible Chronic Kidney Disease.    Anion gap 11 5 - 15    Comment: Performed at [facility] [address], [place], [place] [address]  Lactic acid, plasma     Status: None   Collection Time: 01/11/18  9:44 PM  Result Value Ref Range   Lactic Acid, Venous 1.4 0.5 - 1.9 mmol/L    Comment: Performed at [facility], [place] [address] [place], [place] [address]  Procalcitonin - Baseline     Status: None   Collection Time: 01/11/18  9:44 PM  Result Value Ref Range   Procalcitonin 41.69 ng/mL    Comment:        Interpretation: PCT >= 10 ng/mL: Important systemic inflammatory response, almost exclusively due to severe bacterial sepsis or septic shock. (NOTE)       Sepsis PCT Algorithm           Lower Respiratory Tract                                      Infection PCT Algorithm    ----------------------------     ----------------------------         PCT < 0.25 ng/mL                PCT < 0.10 ng/mL         Strongly encourage             Strongly discourage   discontinuation of antibiotics    initiation of antibiotics    ----------------------------     -----------------------------       PCT 0.25 - 0.50 ng/mL            PCT 0.10 - 0.25 ng/mL               OR       >80% decrease in PCT            Discourage initiation of                                            antibiotics      Encourage discontinuation           of antibiotics    ----------------------------     -----------------------------         PCT >= 0.50 ng/mL              PCT 0.26 - 0.50 ng/mL                AND       <80% decrease in PCT             Encourage initiation of                                             antibiotics       Encourage continuation           of antibiotics    ----------------------------     -----------------------------        PCT >= 0.50 ng/mL                  PCT > 0.50 ng/mL               AND          increase in PCT  Strongly encourage                                      initiation of antibiotics    Strongly encourage escalation           of antibiotics                                     -----------------------------                                           PCT <= 0.25 ng/mL                                                 OR                                        > 80% decrease in PCT                                     Discontinue / Do not initiate                                             antibiotics Performed at [facility], [place] [address], [place], [place] [address]   CBC     Status: Abnormal   Collection Time: 01/12/18  2:58 AM  Result Value Ref Range   WBC 23.0 (H) 4.0 - 10.5 K/uL   RBC 2.80 (L) 3.87 - 5.11 MIL/uL   Hemoglobin 8.1 (L) 12.0 - 15.0 g/dL   HCT 24.1 (L) 36.0 - 46.0 %   MCV 86.1 78.0 - 100.0 fL   MCH 28.9 26.0 - 34.0 pg   MCHC 33.6 30.0 - 36.0 g/dL   RDW 14.6 11.5 - 15.5 %   Platelets 42 (L) 150 - 400 K/uL    Comment: CONSISTENT WITH PREVIOUS RESULT Performed at [facility], [place] [address], [place], [place] [address]   Comprehensive metabolic panel     Status: Abnormal   Collection Time: 01/12/18  2:58 AM  Result Value Ref Range   Sodium 133 (L) 135 - 145 mmol/L   Potassium 3.3 (L) 3.5 - 5.1 mmol/L   Chloride 104 98 - 111 mmol/L   CO2 18 (L) 22 - 32 mmol/L   Glucose, Bld 103 (H) 70 - 99 mg/dL   BUN 70 (H) 8 - 23 mg/dL   Creatinine, Ser 4.27 (H) 0.44 - 1.00 mg/dL   Calcium 7.2 (L) 8.9 - 10.3 mg/dL   Total Protein 4.7 (L) 6.5 - 8.1 g/dL   Albumin 2.1 (L) 3.5 - 5.0 g/dL   AST 1,080 (H) 15 - 41 U/L   ALT 659 (H) 0 - 44 U/L   Alkaline Phosphatase 114 38 - 126 U/L  Total Bilirubin 3.2 (H) 0.3 - 1.2 mg/dL   GFR calc non Af Amer 10 (L) >60 mL/min   GFR calc Af Amer 12 (L) >60 mL/min    Comment: (NOTE) The eGFR has been calculated using the CKD EPI equation. This calculation has not been validated in all clinical  situations. eGFR's persistently <60 mL/min signify possible Chronic Kidney Disease.    Anion gap 11 5 - 15    Comment: Performed at [facility] [address] [place], [place] [address]  Lactic acid, plasma     Status: None   Collection Time: 01/12/18  2:58 AM  Result Value Ref Range   Lactic Acid, Venous 0.8 0.5 - 1.9 mmol/L    Comment: Performed at [facility] [address], [place], [place] [address]  Procalcitonin     Status: None   Collection Time: 01/12/18  2:58 AM  Result Value Ref Range   Procalcitonin 44.44 ng/mL    Comment:        Interpretation: PCT >= 10 ng/mL: Important systemic inflammatory response, almost exclusively due to severe bacterial sepsis or septic shock. (NOTE)       Sepsis PCT Algorithm           Lower Respiratory Tract                                      Infection PCT Algorithm    ----------------------------     ----------------------------         PCT < 0.25 ng/mL                PCT < 0.10 ng/mL         Strongly encourage             Strongly discourage   discontinuation of antibiotics    initiation of antibiotics    ----------------------------     -----------------------------       PCT 0.25 - 0.50 ng/mL            PCT 0.10 - 0.25 ng/mL               OR       >80% decrease in PCT            Discourage initiation of                                            antibiotics      Encourage discontinuation           of antibiotics    ----------------------------     -----------------------------         PCT >= 0.50 ng/mL              PCT 0.26 - 0.50 ng/mL                AND       <80% decrease in PCT             Encourage initiation of                                             antibiotics  Encourage continuation           of antibiotics    ----------------------------     -----------------------------        PCT >= 0.50 ng/mL                  PCT > 0.50 ng/mL               AND         increase in PCT                   Strongly encourage                                      initiation of antibiotics    Strongly encourage escalation           of antibiotics                                     -----------------------------                                           PCT <= 0.25 ng/mL                                                 OR                                        > 80% decrease in PCT                                     Discontinue / Do not initiate                                             antibiotics Performed at [facility], [address] [place], [place] [address]   Lactic acid, plasma     Status: None   Collection Time: 01/12/18  9:18 AM  Result Value Ref Range   Lactic Acid, Venous 0.8 0.5 - 1.9 mmol/L    Comment: Performed at [facility] [address], [place],  [address]  Procalcitonin     Status: None   Collection Time: 01/13/18  4:00 AM  Result Value Ref Range   Procalcitonin 34.84 ng/mL    Comment:        Interpretation: PCT >= 10 ng/mL: Important systemic inflammatory response, almost exclusively due to severe bacterial sepsis or septic shock. (NOTE)       Sepsis PCT Algorithm           Lower Respiratory Tract  Infection PCT Algorithm    ----------------------------     ----------------------------         PCT < 0.25 ng/mL                PCT < 0.10 ng/mL         Strongly encourage             Strongly discourage   discontinuation of antibiotics    initiation of antibiotics    ----------------------------     -----------------------------       PCT 0.25 - 0.50 ng/mL            PCT 0.10 - 0.25 ng/mL               OR       >80% decrease in PCT            Discourage initiation of                                            antibiotics      Encourage discontinuation           of antibiotics    ----------------------------     -----------------------------         PCT >= 0.50 ng/mL              PCT 0.26 - 0.50  ng/mL                AND       <80% decrease in PCT             Encourage initiation of                                             antibiotics       Encourage continuation           of antibiotics    ----------------------------     -----------------------------        PCT >= 0.50 ng/mL                  PCT > 0.50 ng/mL               AND         increase in PCT                  Strongly encourage                                      initiation of antibiotics    Strongly encourage escalation           of antibiotics                                     -----------------------------                                           PCT <= 0.25 ng/mL  OR                                        > 80% decrease in PCT                                     Discontinue / Do not initiate                                             antibiotics Performed at [facility], [place] [address] [place], [place] [address]   Comprehensive metabolic panel     Status: Abnormal   Collection Time: 01/13/18  4:00 AM  Result Value Ref Range   Sodium 135 135 - 145 mmol/L   Potassium 3.2 (L) 3.5 - 5.1 mmol/L   Chloride 107 98 - 111 mmol/L   CO2 16 (L) 22 - 32 mmol/L   Glucose, Bld 102 (H) 70 - 99 mg/dL   BUN 73 (H) 8 - 23 mg/dL   Creatinine, Ser 4.38 (H) 0.44 - 1.00 mg/dL   Calcium 6.5 (L) 8.9 - 10.3 mg/dL   Total Protein 4.6 (L) 6.5 - 8.1 g/dL   Albumin 1.8 (L) 3.5 - 5.0 g/dL   AST 554 (H) 15 - 41 U/L   ALT 549 (H) 0 - 44 U/L   Alkaline Phosphatase 148 (H) 38 - 126 U/L   Total Bilirubin 2.0 (H) 0.3 - 1.2 mg/dL   GFR calc non Af Amer 10 (L) >60 mL/min   GFR calc Af Amer 11 (L) >60 mL/min    Comment: (NOTE) The eGFR has been calculated using the CKD EPI equation. This calculation has not been validated in all clinical situations. eGFR's persistently <60 mL/min signify possible Chronic Kidney Disease.    Anion gap 12 5 - 15    Comment: Performed at [facility] [address], [place], [place] [address]  CBC     Status: Abnormal   Collection Time: 01/13/18  4:00 AM  Result Value Ref Range   WBC 30.4 (H) 4.0 - 10.5 K/uL   RBC 2.55 (L) 3.87 - 5.11 MIL/uL   Hemoglobin 7.5 (L) 12.0 - 15.0 g/dL   HCT 21.2 (L) 36.0 - 46.0 %   MCV 83.1 78.0 - 100.0 fL   MCH 29.4 26.0 - 34.0 pg   MCHC 35.4 30.0 - 36.0 g/dL   RDW 14.7 11.5 - 15.5 %   Platelets 39 (L) 150 - 400 K/uL    Comment: CONSISTENT WITH PREVIOUS RESULT Performed at [facility], [place] [address] [place], [place] [address]     MICRO: 8/9 aspirate strep species 8/7 blood cx strep species 8/9 blood cx ngtd IMAGING: US Guided Needle Placement  Result Date: 01/11/2018 INDICATION: Heterogeneous mass versus abscess of the liver with liquefied component. Presentation with abdominal pain and sepsis with positive blood cultures. EXAM: 1. ULTRASOUND-GUIDED CORE BIOPSY LIVER 2. ULTRASOUND GUIDED PERCUTANEOUS CATHETER DRAINAGE OF LIVER MEDICATIONS: The patient is currently admitted to the hospital and receiving intravenous antibiotics. The antibiotics were administered within an appropriate time frame prior to the initiation of the procedure. ANESTHESIA/SEDATION: Fentanyl 75 mcg IV; Versed 1.5 mg IV Moderate Sedation  Time:  31 minutes. The patient was continuously monitored during the procedure by the interventional radiology nurse under my direct supervision. COMPLICATIONS: None immediate. PROCEDURE: Informed written consent was obtained from the patient after a thorough discussion of the procedural risks, benefits and alternatives. All questions were addressed. Maximal Sterile Barrier Technique was utilized including caps, mask, sterile gowns, sterile gloves, sterile drape, hand hygiene and skin antiseptic. A timeout was performed prior to the initiation of the procedure. Ultrasound was performed of the liver. Under ultrasound guidance, a 17 gauge needle was advanced into the right lobe. 18  gauge core biopsy samples were obtained with 3 samples submitted in formalin. The 17 gauge needle was then advanced into a liquified component. Aspiration of fluid was performed and sample sent for both culture analysis and cytology. A guidewire was advanced through the needle. The tract was dilated and a 10 French percutaneous drainage catheter advanced. Catheter position was confirmed by ultrasound. The catheter was connected to a suction bulb. The drainage catheter was secured at the skin with a Prolene retention suture and adhesive StatLock device. FINDINGS: Large heterogeneous abnormality of the liver is present with ill-defined borders. The area of parenchymal abnormality spans at least an area 11 cm. The echogenic rim surrounding a liquified component was sampled with tissue biopsy. Aspiration at the level of the liquefied component yielded bloody and turbid fluid. A 10 French drain was placed in this collection which is draining bloody fluid. IMPRESSION: Ultrasound-guided biopsy of the liver lesion along the edge of the liquefied component followed by aspiration of the liquefied component yielding bloody and turbid fluid which was sent for culture analysis and cytology. Additional placement of a 10 French percutaneous drainage catheter into the liquefied component which was attached to suction bulb drainage. Electronically Signed   By: Aletta Edouard M.D.   On: 01/11/2018 17:15   US Biopsy (liver)  Result Date: 01/11/2018 INDICATION: Heterogeneous mass versus abscess of the liver with liquefied component. Presentation with abdominal pain and sepsis with positive blood cultures. EXAM: 1. ULTRASOUND-GUIDED CORE BIOPSY LIVER 2. ULTRASOUND GUIDED PERCUTANEOUS CATHETER DRAINAGE OF LIVER MEDICATIONS: The patient is currently admitted to the hospital and receiving intravenous antibiotics. The antibiotics were administered within an appropriate time frame prior to the initiation of the procedure.  ANESTHESIA/SEDATION: Fentanyl 75 mcg IV; Versed 1.5 mg IV Moderate Sedation Time:  31 minutes. The patient was continuously monitored during the procedure by the interventional radiology nurse under my direct supervision. COMPLICATIONS: None immediate. PROCEDURE: Informed written consent was obtained from the patient after a thorough discussion of the procedural risks, benefits and alternatives. All questions were addressed. Maximal Sterile Barrier Technique was utilized including caps, mask, sterile gowns, sterile gloves, sterile drape, hand hygiene and skin antiseptic. A timeout was performed prior to the initiation of the procedure. Ultrasound was performed of the liver. Under ultrasound guidance, a 17 gauge needle was advanced into the right lobe. 18 gauge core biopsy samples were obtained with 3 samples submitted in formalin. The 17 gauge needle was then advanced into a liquified component. Aspiration of fluid was performed and sample sent for both culture analysis and cytology. A guidewire was advanced through the needle. The tract was dilated and a 10 French percutaneous drainage catheter advanced. Catheter position was confirmed by ultrasound. The catheter was connected to a suction bulb. The drainage catheter was secured at the skin with a Prolene retention suture and adhesive StatLock device. FINDINGS: Large heterogeneous abnormality of the liver is present with ill-defined  borders. The area of parenchymal abnormality spans at least an area 11 cm. The echogenic rim surrounding a liquified component was sampled with tissue biopsy. Aspiration at the level of the liquefied component yielded bloody and turbid fluid. A 10 French drain was placed in this collection which is draining bloody fluid. IMPRESSION: Ultrasound-guided biopsy of the liver lesion along the edge of the liquefied component followed by aspiration of the liquefied component yielding bloody and turbid fluid which was sent for culture analysis  and cytology. Additional placement of a 10 French percutaneous drainage catheter into the liquefied component which was attached to suction bulb drainage. Electronically Signed   By: Aletta Edouard M.D.   On: 01/11/2018 17:15   Dg Chest Port 1 View  Result Date: 01/11/2018 CLINICAL DATA:  Dyspnea. EXAM: PORTABLE CHEST 1 VIEW COMPARISON:  Radiograph of January 09, 2018. FINDINGS: Stable cardiomediastinal silhouette. No pneumothorax is noted. Left lung is clear. Right-sided PICC line is noted with distal tip in expected position of SVC. Mild right basilar opacity is noted concerning for atelectasis or infiltrate with associated pleural effusion. Bony thorax is unremarkable. IMPRESSION: Interval development of mild right basilar opacity concerning for atelectasis or infiltrate with possible associated pleural effusion. Interval placement of right-sided PICC line with distal tip in expected position of SVC. Electronically Signed   By: Marijo Conception, M.D.   On: 01/11/2018 15:27    HISTORICAL MICRO/IMAGING  Assessment/Plan: 62yo F admitted for sepsis found to have streptococcal bacteremia in the setting of a large heterogenous liver abscess with ongoing leukocytosis  - I think that her WBC elevation in the last 2 days is due to the hydrocortisone doses she received on 8/8 - if it doesn't trend down tomorrow, then would change abtx. -  For now would continue with piptazo - the size of her liver lesion is quite large which is unusual for not having significant symptoms. Hepatic cytology pending  Health maintenance = would check hep c  Dr Johnnye Sima to see tomorrow.

## 2018-01-14 DIAGNOSIS — K750 Abscess of liver: Secondary | ICD-10-CM

## 2018-01-14 DIAGNOSIS — Z95828 Presence of other vascular implants and grafts: Secondary | ICD-10-CM

## 2018-01-14 DIAGNOSIS — Z978 Presence of other specified devices: Secondary | ICD-10-CM

## 2018-01-14 DIAGNOSIS — R601 Generalized edema: Secondary | ICD-10-CM

## 2018-01-14 LAB — COMPREHENSIVE METABOLIC PANEL
ALT: 466 U/L — ABNORMAL HIGH (ref 0–44)
ANION GAP: 13 (ref 5–15)
AST: 260 U/L — ABNORMAL HIGH (ref 15–41)
Albumin: 1.8 g/dL — ABNORMAL LOW (ref 3.5–5.0)
Alkaline Phosphatase: 193 U/L — ABNORMAL HIGH (ref 38–126)
BUN: 87 mg/dL — ABNORMAL HIGH (ref 8–23)
CHLORIDE: 103 mmol/L (ref 98–111)
CO2: 16 mmol/L — AB (ref 22–32)
Calcium: 6.8 mg/dL — ABNORMAL LOW (ref 8.9–10.3)
Creatinine, Ser: 5.18 mg/dL — ABNORMAL HIGH (ref 0.44–1.00)
GFR calc non Af Amer: 8 mL/min — ABNORMAL LOW (ref >60)
GFR, EST AFRICAN AMERICAN: 9 mL/min — AB (ref >60)
Glucose, Bld: 122 mg/dL — ABNORMAL HIGH (ref 70–99)
Potassium: 3.7 mmol/L (ref 3.5–5.1)
SODIUM: 132 mmol/L — AB (ref 135–145)
Total Bilirubin: 1.9 mg/dL — ABNORMAL HIGH (ref 0.3–1.2)
Total Protein: 5.2 g/dL — ABNORMAL LOW (ref 6.5–8.1)

## 2018-01-14 LAB — CBC
HCT: 22.4 % — ABNORMAL LOW (ref 36.0–46.0)
HEMOGLOBIN: 8 g/dL — AB (ref 12.0–15.0)
MCH: 29.5 pg (ref 26.0–34.0)
MCHC: 35.7 g/dL (ref 30.0–36.0)
MCV: 82.7 fL (ref 78.0–100.0)
Platelets: 58 10*3/uL — ABNORMAL LOW (ref 150–400)
RBC: 2.71 MIL/uL — AB (ref 3.87–5.11)
RDW: 14.9 % (ref 11.5–15.5)
WBC: 37.1 10*3/uL — ABNORMAL HIGH (ref 4.0–10.5)

## 2018-01-14 LAB — URINALYSIS, ROUTINE W REFLEX MICROSCOPIC
BILIRUBIN URINE: NEGATIVE
Glucose, UA: NEGATIVE mg/dL
Ketones, ur: NEGATIVE mg/dL
Leukocytes, UA: NEGATIVE
Nitrite: NEGATIVE
PH: 5 (ref 5.0–8.0)
Protein, ur: NEGATIVE mg/dL
SPECIFIC GRAVITY, URINE: 1.01 (ref 1.005–1.030)

## 2018-01-14 MED ORDER — SODIUM CHLORIDE 0.9 % IV SOLN
2.0000 g | INTRAVENOUS | Status: DC
  Administered 2018-01-14 – 2018-01-24 (×11): 2 g via INTRAVENOUS
  Filled 2018-01-14 (×11): qty 20

## 2018-01-14 MED ORDER — SODIUM BICARBONATE 650 MG PO TABS
1300.0000 mg | ORAL_TABLET | Freq: Three times a day (TID) | ORAL | Status: DC
  Administered 2018-01-14 – 2018-01-19 (×16): 1300 mg via ORAL
  Filled 2018-01-14 (×16): qty 2

## 2018-01-14 MED ORDER — FUROSEMIDE 80 MG PO TABS
160.0000 mg | ORAL_TABLET | Freq: Two times a day (BID) | ORAL | Status: AC
  Administered 2018-01-14 (×2): 160 mg via ORAL
  Filled 2018-01-14 (×2): qty 2

## 2018-01-14 NOTE — Plan of Care (Signed)

## 2018-01-14 NOTE — Progress Notes (Signed)
PROGRESS NOTE    Lisa Bauer  HGD:[id number] DOB: 1955-09-08 DOA: 01/09/2018  PCP: Wenda Low, MD   Brief Narrative:  62 year old lady with prior h/o  comes in for fever, abdominal pain, was found to have hypotension and elevated lactic acid. Further evaluation with a CT abdomen showed heterogenous liver mass. She was initially admitted by [facility] for septic shock and briefly required pressors.  She is off the pressors and transferred to [facility] service on 8/9.    Assessment & Plan:   Septic shock / Streptococcus bacteremia / Leukocytosis - Thought to be due to possible liver abscess - Blood cx growing streptococcus - Underwent drainage placement 8/9 and biopsy, follow up results  - Procalcitonin is trending down and lactic acid yesterday was WNL - WBC count trending up, ID consulted - pt currently on zosyn but this may need to be changed due to rising WBC count   Normocytic anemia - Hgb 8  LE swelling - Likely from fluid resuscitation for septic shock - Stopped IV fluids 8/12 - Per renal, start lasix   Acute kidney injury with metabolic acidosis - ATN or pre-renal etiology from septic shock - Per renal this seems to be likely etiology as well - Cr is trending up, renal following   Hypokalemia / Hypomagnesemia and hypophosphatemia - Continue to supplement   Thrombocytopenia - S/P 2 U platelets - Platelets in 50 range   Heterogenous liver mass - Follow up liver bx results    DVT prophylaxis: SCD's Code Status: full code  Family Communication: husband at bedside this am Disposition Plan: patient acute    Consultants:   Radiology  Infectious diease  Procedures:   Liver bx  Antimicrobials:   Zosyn 8/8 -->   Subjective: No overnight events.  Objective: Vitals:   01/13/18 1819 01/13/18 2049 01/13/18 2344 01/14/18 0430  BP: 130/76 122/78 110/68 101/89  Pulse: 86 71 80 72  Resp: (!) 21 16 16 17   Temp: 98.9 F (37.2 C) 99.5 F (37.5 C) 98.2  F (36.8 C) 97.8 F (36.6 C)  TempSrc: Oral Oral Oral Oral  SpO2: 95% 93%  97%  Weight:    103.1 kg  Height:        Intake/Output Summary (Last 24 hours) at 01/14/2018 0953 Last data filed at 01/14/2018 0352 Gross per 24 hour  Intake 630 ml  Output 795 ml  Net -165 ml   Filed Weights   01/12/18 0450 01/13/18 0426 01/14/18 0430  Weight: 98.7 kg 101.1 kg 103.1 kg    Examination:  Physical Exam  Constitutional: Appears well-developed and well-nourished. No distress.  HENT: Normocephalic. External right and left ear normal. Oropharynx is clear and moist.  Eyes: Conjunctivae and EOM are normal. PERRLA, no scleral icterus.  Neck: Normal ROM. Neck supple. No JVD.  CVS: rate controlled, S1/S2 + Pulmonary: no wheezing, no rhonchi  Abdominal: distended, non tender, (+) BS Musculoskeletal: Normal range of motion. +2 LE edema.  Lymphadenopathy: No lymphadenopathy noted, cervical, inguinal. Neuro: Alert. Normal reflexes, muscle tone coordination. No cranial nerve deficit. Skin: Skin is warm and dry.  Psychiatric: Normal mood and affect. No restlessness    Data Reviewed: I have personally reviewed following labs and imaging studies  CBC: Recent Labs  Lab 01/09/18 1151  01/11/18 0356 01/11/18 1829 01/12/18 0258 01/13/18 0400 01/14/18 0542  WBC 9.9   < > 14.9* 18.3* 23.0* 30.4* 37.1*  NEUTROABS 8.4*  --   --   --   --  27.4*  --   HGB 12.3   < > 10.6* 9.3* 8.1* 7.5* 8.0*  HCT 36.6   < > 31.3* 27.2* 24.1* 21.2* 22.4*  MCV 87.6   < > 84.8 85.3 86.1 83.1 82.7  PLT 93*   < > 54* 59* 42* 39* 58*   < > = values in this interval not displayed.   Basic Metabolic Panel: Recent Labs  Lab 01/09/18 1443  01/10/18 0228 01/11/18 0356 01/11/18 2144 01/12/18 0258 01/13/18 0400 01/14/18 0350  NA  --    < > 134* 133* 133* 133* 135 132*  K  --    < > 2.8* 3.4* 3.4* 3.3* 3.2* 3.7  CL  --    < > 99 101 105 104 107 103  CO2  --    < > 21* 17* 17* 18* 16* 16*  GLUCOSE  --    < > 86 110*  123* 103* 102* 122*  BUN  --    < > 45* 63* 70* 70* 73* 87*  CREATININE  --    < > 3.01* 3.72* 4.24* 4.27* 4.38* 5.18*  CALCIUM  --    < > 6.8* 6.7* 6.3* 7.2* 6.5* 6.8*  MG 1.6*  --  1.6* 2.4  --   --   --   --   PHOS  --   --  2.4* 3.8  --   --   --   --    < > = values in this interval not displayed.   GFR: Estimated Creatinine Clearance: 15.1 mL/min (A) (by C-G formula based on SCr of 5.18 mg/dL (H)). Liver Function Tests: Recent Labs  Lab 01/11/18 1105 01/11/18 2144 01/12/18 0258 01/13/18 0400 01/14/18 0350  AST 273* 669* 1,080* 554* 260*  ALT 319* 487* 659* 549* 466*  ALKPHOS 70 106 114 148* 193*  BILITOT 2.4* 3.1* 3.2* 2.0* 1.9*  PROT 5.1* 4.7* 4.7* 4.6* 5.2*  ALBUMIN 2.2* 2.1* 2.1* 1.8* 1.8*   Recent Labs  Lab 01/09/18 1151  LIPASE 24   No results for input(s): AMMONIA in the last 168 hours. Coagulation Profile: Recent Labs  Lab 01/09/18 1151  INR 1.24   Cardiac Enzymes: No results for input(s): CKTOTAL, CKMB, CKMBINDEX, TROPONINI in the last 168 hours. BNP (last 3 results) No results for input(s): PROBNP in the last 8760 hours. HbA1C: No results for input(s): HGBA1C in the last 72 hours. CBG: Recent Labs  Lab 01/09/18 1640 01/10/18 0843 01/10/18 1624  GLUCAP 103* 95 125*   Lipid Profile: No results for input(s): CHOL, HDL, LDLCALC, TRIG, CHOLHDL, LDLDIRECT in the last 72 hours. Thyroid Function Tests: No results for input(s): TSH, T4TOTAL, FREET4, T3FREE, THYROIDAB in the last 72 hours. Anemia Panel: Recent Labs    01/11/18 1105  VITAMINB12 3,227*  FERRITIN 487*  TIBC 158*  IRON 15*  RETICCTPCT 0.6   Urine analysis:    Component Value Date/Time   COLORURINE AMBER (A) 01/11/2018 0024   APPEARANCEUR CLOUDY (A) 01/11/2018 0024   LABSPEC 1.018 01/11/2018 0024   PHURINE 5.0 01/11/2018 0024   GLUCOSEU NEGATIVE 01/11/2018 0024   HGBUR MODERATE (A) 01/11/2018 0024   BILIRUBINUR NEGATIVE 01/11/2018 0024   KETONESUR NEGATIVE 01/11/2018 0024    PROTEINUR 100 (A) 01/11/2018 0024   NITRITE NEGATIVE 01/11/2018 0024   LEUKOCYTESUR TRACE (A) 01/11/2018 0024   Sepsis Labs: @LABRCNTIP (procalcitonin:4,lacticidven:4)   ) Recent Results (from the past 240 hour(s))  Blood Culture (routine x 2)     Status: Abnormal  Collection Time: 01/09/18 11:40 AM  Result Value Ref Range Status   Specimen Description BLOOD RIGHT ANTECUBITAL  Final   Special Requests   Final    BOTTLES DRAWN AEROBIC AND ANAEROBIC Blood Culture adequate volume   Culture  Setup Time   Final    GRAM POSITIVE COCCI IN CHAINS IN BOTH AEROBIC AND ANAEROBIC BOTTLES CRITICAL RESULT CALLED TO, READ BACK BY AND VERIFIED WITH: Laurice Record PharmD 16:10 01/10/18 ([email or web address])    Culture (A)  Final    VIRIDANS STREPTOCOCCUS SUSCEPTIBILITIES PERFORMED ON PREVIOUS CULTURE WITHIN THE LAST 5 DAYS. Performed at [facility], [place] [address] [place], [place] [address]    Report Status 01/13/2018 FINAL  Final  Blood Culture ID Panel (Reflexed)     Status: Abnormal   Collection Time: 01/09/18 11:40 AM  Result Value Ref Range Status   Enterococcus species NOT DETECTED NOT DETECTED Final   Listeria monocytogenes NOT DETECTED NOT DETECTED Final   Staphylococcus species NOT DETECTED NOT DETECTED Final   Staphylococcus aureus NOT DETECTED NOT DETECTED Final   Streptococcus species DETECTED (A) NOT DETECTED Final    Comment: Not Enterococcus species, Streptococcus agalactiae, Streptococcus pyogenes, or Streptococcus pneumoniae. CRITICAL RESULT CALLED TO, READ BACK BY AND VERIFIED WITH: Laurice Record PharmD 16:10 01/10/18 ([email or web address])    Streptococcus agalactiae NOT DETECTED NOT DETECTED Final   Streptococcus pneumoniae NOT DETECTED NOT DETECTED Final   Streptococcus pyogenes NOT DETECTED NOT DETECTED Final   Acinetobacter baumannii NOT DETECTED NOT DETECTED Final   Enterobacteriaceae species NOT DETECTED NOT DETECTED Final   Enterobacter cloacae complex NOT DETECTED NOT DETECTED Final    Escherichia coli NOT DETECTED NOT DETECTED Final   Klebsiella oxytoca NOT DETECTED NOT DETECTED Final   Klebsiella pneumoniae NOT DETECTED NOT DETECTED Final   Proteus species NOT DETECTED NOT DETECTED Final   Serratia marcescens NOT DETECTED NOT DETECTED Final   Haemophilus influenzae NOT DETECTED NOT DETECTED Final   Neisseria meningitidis NOT DETECTED NOT DETECTED Final   Pseudomonas aeruginosa NOT DETECTED NOT DETECTED Final   Candida albicans NOT DETECTED NOT DETECTED Final   Candida glabrata NOT DETECTED NOT DETECTED Final   Candida krusei NOT DETECTED NOT DETECTED Final   Candida parapsilosis NOT DETECTED NOT DETECTED Final   Candida tropicalis NOT DETECTED NOT DETECTED Final    Comment: Performed at [facility], [place] [address] [place], [place] [address]  Blood Culture (routine x 2)     Status: Abnormal   Collection Time: 01/09/18 11:50 AM  Result Value Ref Range Status   Specimen Description BLOOD LEFT ANTECUBITAL  Final   Special Requests   Final    BOTTLES DRAWN AEROBIC AND ANAEROBIC Blood Culture adequate volume   Culture  Setup Time   Final    GRAM POSITIVE COCCI IN CHAINS ANAEROBIC BOTTLE ONLY CRITICAL VALUE NOTED.  VALUE IS CONSISTENT WITH PREVIOUSLY REPORTED AND CALLED VALUE. Performed at [facility], [place] [address] [place], [place] [address]    Culture VIRIDANS STREPTOCOCCUS (A)  Final   Report Status 01/13/2018 FINAL  Final   Organism ID, Bacteria VIRIDANS STREPTOCOCCUS  Final      Susceptibility   Viridans streptococcus - MIC*    PENICILLIN <=0.06 SENSITIVE Sensitive     ERYTHROMYCIN <=0.12 SENSITIVE Sensitive     LEVOFLOXACIN 0.5 SENSITIVE Sensitive     VANCOMYCIN 0.5 SENSITIVE Sensitive     * VIRIDANS STREPTOCOCCUS  MRSA PCR Screening     Status: None  Collection Time: 01/09/18  4:31 PM  Result Value Ref Range Status   MRSA by PCR NEGATIVE NEGATIVE Final    Comment:        The GeneXpert MRSA Assay (FDA approved for NASAL specimens only),  is one component of a comprehensive MRSA colonization surveillance program. It is not intended to diagnose MRSA infection nor to guide or monitor treatment for MRSA infections. Performed at [facility], [place] [address] [place], [place] [address]   Culture, blood (routine x 2)     Status: None (Preliminary result)   Collection Time: 01/11/18 12:18 AM  Result Value Ref Range Status   Specimen Description BLOOD RIGHT PICC LINE  Final   Special Requests   Final    BOTTLES DRAWN AEROBIC AND ANAEROBIC Blood Culture adequate volume   Culture   Final    NO GROWTH 1 DAY Performed at [facility], [place] [address], [place], [place] [address]    Report Status PENDING  Incomplete  Aerobic/Anaerobic Culture (surgical/deep wound)     Status: None (Preliminary result)   Collection Time: 01/11/18  2:27 PM  Result Value Ref Range Status   Specimen Description ABSCESS  Final   Special Requests Normal  Final   Gram Stain   Final    MODERATE WBC PRESENT, PREDOMINANTLY PMN MODERATE GRAM POSITIVE COCCI Performed at [facility], [place] [address] [place], [place] [address]    Culture   Final    FEW STREPTOCOCCUS GROUP F NO ANAEROBES ISOLATED; CULTURE IN PROGRESS FOR 5 DAYS    Report Status PENDING  Incomplete  Culture, blood (routine x 2)     Status: None (Preliminary result)   Collection Time: 01/11/18  9:29 PM  Result Value Ref Range Status   Specimen Description BLOOD BLOOD LEFT FOREARM  Final   Special Requests   Final    BOTTLES DRAWN AEROBIC ONLY Blood Culture results may not be optimal due to an inadequate volume of blood received in culture bottles   Culture   Final    NO GROWTH 2 DAYS Performed at [facility], [place] [address] [place], [place] [address]    Report Status PENDING  Incomplete      Radiology Studies: US Guided Needle Placement  Result Date: 01/11/2018 INDICATION: Heterogeneous mass versus abscess of the liver with liquefied component.  Presentation with abdominal pain and sepsis with positive blood cultures. EXAM: 1. ULTRASOUND-GUIDED CORE BIOPSY LIVER 2. ULTRASOUND GUIDED PERCUTANEOUS CATHETER DRAINAGE OF LIVER MEDICATIONS: The patient is currently admitted to the hospital and receiving intravenous antibiotics. The antibiotics were administered within an appropriate time frame prior to the initiation of the procedure. ANESTHESIA/SEDATION: Fentanyl 75 mcg IV; Versed 1.5 mg IV Moderate Sedation Time:  31 minutes. The patient was continuously monitored during the procedure by the interventional radiology nurse under my direct supervision. COMPLICATIONS: None immediate. PROCEDURE: Informed written consent was obtained from the patient after a thorough discussion of the procedural risks, benefits and alternatives. All questions were addressed. Maximal Sterile Barrier Technique was utilized including caps, mask, sterile gowns, sterile gloves, sterile drape, hand hygiene and skin antiseptic. A timeout was performed prior to the initiation of the procedure. Ultrasound was performed of the liver. Under ultrasound guidance, a 17 gauge needle was advanced into the right lobe. 18 gauge core biopsy samples were obtained with 3 samples submitted in formalin. The 17 gauge needle was then advanced into a liquified component. Aspiration of fluid was performed and  sample sent for both culture analysis and cytology. A guidewire was advanced through the needle. The tract was dilated and a 10 French percutaneous drainage catheter advanced. Catheter position was confirmed by ultrasound. The catheter was connected to a suction bulb. The drainage catheter was secured at the skin with a Prolene retention suture and adhesive StatLock device. FINDINGS: Large heterogeneous abnormality of the liver is present with ill-defined borders. The area of parenchymal abnormality spans at least an area 11 cm. The echogenic rim surrounding a liquified component was sampled with tissue  biopsy. Aspiration at the level of the liquefied component yielded bloody and turbid fluid. A 10 French drain was placed in this collection which is draining bloody fluid. IMPRESSION: Ultrasound-guided biopsy of the liver lesion along the edge of the liquefied component followed by aspiration of the liquefied component yielding bloody and turbid fluid which was sent for culture analysis and cytology. Additional placement of a 10 French percutaneous drainage catheter into the liquefied component which was attached to suction bulb drainage. Electronically Signed   By: Aletta Edouard M.D.   On: 01/11/2018 17:15   US Biopsy (liver)  Result Date: 01/11/2018 INDICATION: Heterogeneous mass versus abscess of the liver with liquefied component. Presentation with abdominal pain and sepsis with positive blood cultures. EXAM: 1. ULTRASOUND-GUIDED CORE BIOPSY LIVER 2. ULTRASOUND GUIDED PERCUTANEOUS CATHETER DRAINAGE OF LIVER MEDICATIONS: The patient is currently admitted to the hospital and receiving intravenous antibiotics. The antibiotics were administered within an appropriate time frame prior to the initiation of the procedure. ANESTHESIA/SEDATION: Fentanyl 75 mcg IV; Versed 1.5 mg IV Moderate Sedation Time:  31 minutes. The patient was continuously monitored during the procedure by the interventional radiology nurse under my direct supervision. COMPLICATIONS: None immediate. PROCEDURE: Informed written consent was obtained from the patient after a thorough discussion of the procedural risks, benefits and alternatives. All questions were addressed. Maximal Sterile Barrier Technique was utilized including caps, mask, sterile gowns, sterile gloves, sterile drape, hand hygiene and skin antiseptic. A timeout was performed prior to the initiation of the procedure. Ultrasound was performed of the liver. Under ultrasound guidance, a 17 gauge needle was advanced into the right lobe. 18 gauge core biopsy samples were obtained  with 3 samples submitted in formalin. The 17 gauge needle was then advanced into a liquified component. Aspiration of fluid was performed and sample sent for both culture analysis and cytology. A guidewire was advanced through the needle. The tract was dilated and a 10 French percutaneous drainage catheter advanced. Catheter position was confirmed by ultrasound. The catheter was connected to a suction bulb. The drainage catheter was secured at the skin with a Prolene retention suture and adhesive StatLock device. FINDINGS: Large heterogeneous abnormality of the liver is present with ill-defined borders. The area of parenchymal abnormality spans at least an area 11 cm. The echogenic rim surrounding a liquified component was sampled with tissue biopsy. Aspiration at the level of the liquefied component yielded bloody and turbid fluid. A 10 French drain was placed in this collection which is draining bloody fluid. IMPRESSION: Ultrasound-guided biopsy of the liver lesion along the edge of the liquefied component followed by aspiration of the liquefied component yielding bloody and turbid fluid which was sent for culture analysis and cytology. Additional placement of a 10 French percutaneous drainage catheter into the liquefied component which was attached to suction bulb drainage. Electronically Signed   By: Aletta Edouard M.D.   On: 01/11/2018 17:15   Dg Chest [facility]  Result Date: 01/11/2018 CLINICAL DATA:  Dyspnea. EXAM: PORTABLE CHEST 1 VIEW COMPARISON:  Radiograph of January 09, 2018. FINDINGS: Stable cardiomediastinal silhouette. No pneumothorax is noted. Left lung is clear. Right-sided PICC line is noted with distal tip in expected position of SVC. Mild right basilar opacity is noted concerning for atelectasis or infiltrate with associated pleural effusion. Bony thorax is unremarkable. IMPRESSION: Interval development of mild right basilar opacity concerning for atelectasis or infiltrate with possible  associated pleural effusion. Interval placement of right-sided PICC line with distal tip in expected position of SVC. Electronically Signed   By: Marijo Conception, M.D.   On: 01/11/2018 15:27   [place] Ekg Site Rite  Result Date: 01/11/2018 If Site Rite image not attached, placement could not be confirmed due to current cardiac rhythm.       Scheduled Meds: . furosemide  160 mg Oral BID  . sodium bicarbonate  1,300 mg Oral TID  . sodium chloride flush  5 mL Intracatheter Q8H   Continuous Infusions: . sodium chloride Stopped (01/12/18 0453)  . piperacillin-tazobactam (ZOSYN)  IV 3.375 g (01/14/18 0511)     LOS: 5 days    Time spent: 25 minutes Greater than 50% of the time spent on counseling and coordinating the care.   Leisa Lenz, MD [facility] Pager [phone]  If 7PM-7AM, please contact night-coverage [email or web address] Password [facility] 01/14/2018, 9:53 AM

## 2018-01-14 NOTE — Progress Notes (Addendum)
INFECTIOUS DISEASE PROGRESS NOTE  ID: Lisa Bauer is a 62 y.o. female with  Active Problems:   Hepatic abscess   ARF (acute renal failure) (HCC)   Severe sepsis with septic shock (HCC)   Bacteremia due to Streptococcus   Elevated liver function tests   Hypokalemia   Hypomagnesemia   Metabolic acidosis   Anemia   Hypophosphatemia  Subjective: Some pain at drain site.   Abtx:  Anti-infectives (From admission, onward)   Start     Dose/Rate Route Frequency Ordered Stop   01/10/18 1300  vancomycin (VANCOCIN) IVPB 1000 mg/200 mL premix  Status:  Discontinued     1,000 mg 200 mL/hr over 60 Minutes Intravenous Every 24 hours 01/09/18 2053 01/10/18 1103   01/09/18 2200  piperacillin-tazobactam (ZOSYN) IVPB 3.375 g     3.375 g 12.5 mL/hr over 240 Minutes Intravenous Every 8 hours 01/09/18 2052     01/09/18 2100  metroNIDAZOLE (FLAGYL) IVPB 500 mg  Status:  Discontinued     500 mg 100 mL/hr over 60 Minutes Intravenous Every 8 hours 01/09/18 2052 01/10/18 1012   01/09/18 2100  vancomycin (VANCOCIN) IVPB 1000 mg/200 mL premix  Status:  Discontinued     1,000 mg 200 mL/hr over 60 Minutes Intravenous Every 24 hours 01/09/18 2052 01/09/18 2053   01/09/18 1430  metroNIDAZOLE (FLAGYL) IVPB 500 mg     500 mg 100 mL/hr over 60 Minutes Intravenous  Once 01/09/18 1424 01/09/18 1526   01/09/18 1300  vancomycin (VANCOCIN) 1,500 mg in sodium chloride 0.9 % 500 mL IVPB     1,500 mg 250 mL/hr over 120 Minutes Intravenous  Once 01/09/18 1204 01/09/18 1508   01/09/18 1145  piperacillin-tazobactam (ZOSYN) IVPB 3.375 g     3.375 g 100 mL/hr over 30 Minutes Intravenous  Once 01/09/18 1131 01/09/18 1226   01/09/18 1145  vancomycin (VANCOCIN) IVPB 1000 mg/200 mL premix  Status:  Discontinued     1,000 mg 200 mL/hr over 60 Minutes Intravenous  Once 01/09/18 1131 01/09/18 1204      Medications:  Scheduled: . furosemide  160 mg Oral BID  . sodium bicarbonate  1,300 mg Oral TID  . sodium  chloride flush  5 mL Intracatheter Q8H    Objective: Vital signs in last 24 hours: Temp:  [97.8 F (36.6 C)-99.5 F (37.5 C)] 98.3 F (36.8 C) (08/12 1052) Pulse Rate:  [71-86] 72 (08/12 0430) Resp:  [12-21] 13 (08/12 1052) BP: (101-130)/(68-89) 109/69 (08/12 1052) SpO2:  [93 %-97 %] 97 % (08/12 0430) Weight:  [103.1 kg] 103.1 kg (08/12 0430)   General appearance: alert, cooperative and no distress Resp: clear to auscultation bilaterally Cardio: regular rate and rhythm GI: normal findings: bowel sounds normal, soft, non-tender and RUQ drian in place Extremities: edema anasarca  Lab Results Recent Labs    01/13/18 0400 01/14/18 0350 01/14/18 0542  WBC 30.4*  --  37.1*  HGB 7.5*  --  8.0*  HCT 21.2*  --  22.4*  NA 135 132*  --   K 3.2* 3.7  --   CL 107 103  --   CO2 16* 16*  --   BUN 73* 87*  --   CREATININE 4.38* 5.18*  --    Liver Panel Recent Labs    01/13/18 0400 01/14/18 0350  PROT 4.6* 5.2*  ALBUMIN 1.8* 1.8*  AST 554* 260*  ALT 549* 466*  ALKPHOS 148* 193*  BILITOT 2.0* 1.9*   Sedimentation Rate No results  for input(s): ESRSEDRATE in the last 72 hours. C-Reactive Protein No results for input(s): CRP in the last 72 hours.  Microbiology: Recent Results (from the past 240 hour(s))  Blood Culture (routine x 2)     Status: Abnormal   Collection Time: 01/09/18 11:40 AM  Result Value Ref Range Status   Specimen Description BLOOD RIGHT ANTECUBITAL  Final   Special Requests   Final    BOTTLES DRAWN AEROBIC AND ANAEROBIC Blood Culture adequate volume   Culture  Setup Time   Final    GRAM POSITIVE COCCI IN CHAINS IN BOTH AEROBIC AND ANAEROBIC BOTTLES CRITICAL RESULT CALLED TO, READ BACK BY AND VERIFIED WITH: Laurice Record PharmD 16:10 01/10/18 ([email or web address])    Culture (A)  Final    VIRIDANS STREPTOCOCCUS SUSCEPTIBILITIES PERFORMED ON PREVIOUS CULTURE WITHIN THE LAST 5 DAYS. Performed at [facility], [place] [address], [place], [place] [address]    Report  Status 01/13/2018 FINAL  Final  Blood Culture ID Panel (Reflexed)     Status: Abnormal   Collection Time: 01/09/18 11:40 AM  Result Value Ref Range Status   Enterococcus species NOT DETECTED NOT DETECTED Final   Listeria monocytogenes NOT DETECTED NOT DETECTED Final   Staphylococcus species NOT DETECTED NOT DETECTED Final   Staphylococcus aureus NOT DETECTED NOT DETECTED Final   Streptococcus species DETECTED (A) NOT DETECTED Final    Comment: Not Enterococcus species, Streptococcus agalactiae, Streptococcus pyogenes, or Streptococcus pneumoniae. CRITICAL RESULT CALLED TO, READ BACK BY AND VERIFIED WITH: Laurice Record PharmD 16:10 01/10/18 ([email or web address])    Streptococcus agalactiae NOT DETECTED NOT DETECTED Final   Streptococcus pneumoniae NOT DETECTED NOT DETECTED Final   Streptococcus pyogenes NOT DETECTED NOT DETECTED Final   Acinetobacter baumannii NOT DETECTED NOT DETECTED Final   Enterobacteriaceae species NOT DETECTED NOT DETECTED Final   Enterobacter cloacae complex NOT DETECTED NOT DETECTED Final   Escherichia coli NOT DETECTED NOT DETECTED Final   Klebsiella oxytoca NOT DETECTED NOT DETECTED Final   Klebsiella pneumoniae NOT DETECTED NOT DETECTED Final   Proteus species NOT DETECTED NOT DETECTED Final   Serratia marcescens NOT DETECTED NOT DETECTED Final   Haemophilus influenzae NOT DETECTED NOT DETECTED Final   Neisseria meningitidis NOT DETECTED NOT DETECTED Final   Pseudomonas aeruginosa NOT DETECTED NOT DETECTED Final   Candida albicans NOT DETECTED NOT DETECTED Final   Candida glabrata NOT DETECTED NOT DETECTED Final   Candida krusei NOT DETECTED NOT DETECTED Final   Candida parapsilosis NOT DETECTED NOT DETECTED Final   Candida tropicalis NOT DETECTED NOT DETECTED Final    Comment: Performed at [facility], [place] [address], [place], [place] [address]  Blood Culture (routine x 2)     Status: Abnormal   Collection Time: 01/09/18 11:50 AM  Result Value Ref Range Status    Specimen Description BLOOD LEFT ANTECUBITAL  Final   Special Requests   Final    BOTTLES DRAWN AEROBIC AND ANAEROBIC Blood Culture adequate volume   Culture  Setup Time   Final    GRAM POSITIVE COCCI IN CHAINS ANAEROBIC BOTTLE ONLY CRITICAL VALUE NOTED.  VALUE IS CONSISTENT WITH PREVIOUSLY REPORTED AND CALLED VALUE. Performed at [facility], [place] [address] [place], [place] [address]    Culture VIRIDANS STREPTOCOCCUS (A)  Final   Report Status 01/13/2018 FINAL  Final   Organism ID, Bacteria VIRIDANS STREPTOCOCCUS  Final      Susceptibility   Viridans streptococcus - MIC*    PENICILLIN <=0.06 SENSITIVE  Sensitive     ERYTHROMYCIN <=0.12 SENSITIVE Sensitive     LEVOFLOXACIN 0.5 SENSITIVE Sensitive     VANCOMYCIN 0.5 SENSITIVE Sensitive     * VIRIDANS STREPTOCOCCUS  MRSA PCR Screening     Status: None   Collection Time: 01/09/18  4:31 PM  Result Value Ref Range Status   MRSA by PCR NEGATIVE NEGATIVE Final    Comment:        The GeneXpert MRSA Assay (FDA approved for NASAL specimens only), is one component of a comprehensive MRSA colonization surveillance program. It is not intended to diagnose MRSA infection nor to guide or monitor treatment for MRSA infections. Performed at [facility], [place] [address] [place], [place] [address]   Culture, blood (routine x 2)     Status: None (Preliminary result)   Collection Time: 01/11/18 12:18 AM  Result Value Ref Range Status   Specimen Description BLOOD RIGHT PICC LINE  Final   Special Requests   Final    BOTTLES DRAWN AEROBIC AND ANAEROBIC Blood Culture adequate volume   Culture   Final    NO GROWTH 1 DAY Performed at [facility], [place] [address], [place], [place] [address]    Report Status PENDING  Incomplete  Aerobic/Anaerobic Culture (surgical/deep wound)     Status: None (Preliminary result)   Collection Time: 01/11/18  2:27 PM  Result Value Ref Range Status   Specimen Description ABSCESS  Final   Special  Requests Normal  Final   Gram Stain   Final    MODERATE WBC PRESENT, PREDOMINANTLY PMN MODERATE GRAM POSITIVE COCCI Performed at [facility], [place] [address] [place], [place] [address]    Culture   Final    FEW STREPTOCOCCUS GROUP F NO ANAEROBES ISOLATED; CULTURE IN PROGRESS FOR 5 DAYS    Report Status PENDING  Incomplete  Culture, blood (routine x 2)     Status: None (Preliminary result)   Collection Time: 01/11/18  9:29 PM  Result Value Ref Range Status   Specimen Description BLOOD BLOOD LEFT FOREARM  Final   Special Requests   Final    BOTTLES DRAWN AEROBIC ONLY Blood Culture results may not be optimal due to an inadequate volume of blood received in culture bottles   Culture   Final    NO GROWTH 2 DAYS Performed at [facility], [place] [address], [place], [place] [address]    Report Status PENDING  Incomplete    Studies/Results: No results found.   Assessment/Plan: Liver abscess  Total days of antibiotics: 6 zosyn   Path shows no evidence of malignancy (discussed with path office). I discussed this with pt and her family.  Will defer to primary team to further w/u.  Would recommend 3 weeks of IV anbx with repeat CT at that time to determine if she needs more IV or can change to po I will change her to ceftriaxone. End in 16 more days.   Can f/u in ID clinic Available as needed.   No Known Allergies  OPAT Orders Discharge antibiotics: ceftriaxone 2g ivpb q24h  Duration: end 01-30-18   [facility] Care Per Protocol: please  Labs weekly while on IV antibiotics: _x_ CBC with differential __ BMP _x_ CMP __ CRP __ ESR __ Vancomycin trough  __ Please pull PIC at completion of IV antibiotics _x_ Please leave PIC in place until doctor has seen patient or been notified  Fax weekly labs to [phone]  Clinic  Follow Up Appt: 2 weeks          Bobby Rumpf MD, FACP Infectious Diseases (pager) [phone] [email or web address] 01/14/2018, 11:13  AM  LOS: 5 days

## 2018-01-14 NOTE — Progress Notes (Signed)
Subjective: Interval History: has complaints swollen, side pain.  Objective: Vital signs in last 24 hours: Temp:  [97.8 F (36.6 C)-99.5 F (37.5 C)] 97.8 F (36.6 C) (08/12 0430) Pulse Rate:  [71-86] 72 (08/12 0430) Resp:  [12-21] 17 (08/12 0430) BP: (101-130)/(68-89) 101/89 (08/12 0430) SpO2:  [93 %-97 %] 97 % (08/12 0430) Weight:  [103.1 kg] 103.1 kg (08/12 0430) Weight change: 1.996 kg  Intake/Output from previous day: 08/11 0701 - 08/12 0700 In: 750 [P.O.:750] Out: 795 [Urine:750; Drains:45] Intake/Output this shift: No intake/output data recorded.  General appearance: alert, cooperative and no distress Resp: diminished breath sounds bilaterally and rales bibasilar Cardio: S1, S2 normal and systolic murmur: holosystolic 2/6, blowing at apex GI: drain RUQ, pos bs, distended Extremities: edema 4+  Lab Results: Recent Labs    01/13/18 0400 01/14/18 0542  WBC 30.4* 37.1*  HGB 7.5* 8.0*  HCT 21.2* 22.4*  PLT 39* 58*   BMET:  Recent Labs    01/13/18 0400 01/14/18 0350  NA 135 132*  K 3.2* 3.7  CL 107 103  CO2 16* 16*  GLUCOSE 102* 122*  BUN 73* 87*  CREATININE 4.38* 5.18*  CALCIUM 6.5* 6.8*   No results for input(s): PTH in the last 72 hours. Iron Studies:  Recent Labs    01/11/18 1105  IRON 15*  TIBC 158*  FERRITIN 487*    Studies/Results: No results found.  I have reviewed the patient's current medications.  Assessment/Plan: 1 AKI presumed sepsis, ACEI,  Low bps.  Has vol xs, acidemia, ? Early uremia.  Low GFR.  Will add Na bicarb.  Making some urine but not much, will add Furosemide for vol.  A possibility this could be GN 2 Liver abscess on Zosyn 3 Anemia 4 HTN not an issue P Lasix, Na bicarb. UA, C3, C4,     LOS: 5 days   Jeneen Rinks Amiera Herzberg 01/14/2018,9:29 AM

## 2018-01-14 NOTE — Progress Notes (Signed)
PHARMACY CONSULT NOTE FOR:  OUTPATIENT  PARENTERAL ANTIBIOTIC THERAPY (OPAT)  Indication: Liver Abscess/Bacteremia Regimen: Ceftriaxone 2 gm every 24 hours  End date: 01/30/18  IV antibiotic discharge orders are pended. To discharging provider:  please sign these orders via discharge navigator,  Select New Orders & click on the button choice - Manage This Unsigned Work.     Thank you for allowing pharmacy to be a part of this patient's care.  Susa Raring, PharmD, BCPS Infectious Diseases Clinical Pharmacist Phone: [phone] 01/14/2018, 11:37 AM

## 2018-01-14 NOTE — Progress Notes (Signed)
Referring Physician(s): Alva,R  Supervising Physician: Dr. Earleen Newport  Patient Status:  [facility] - In-pt  Chief Complaint: Abdominal pain, hepatic mass/abscess   Subjective: Pt tired Still sore at drain site but better each day. Currently sitting up in chair. Says she's eating pretty good, no BM yet. Multiple family members at [place]   Allergies: Patient has no known allergies.  Medications:  Current Facility-Administered Medications:  .  0.9 %  sodium chloride infusion, 250 mL, Intravenous, PRN, Hosie Poisson, MD, Stopped at 01/12/18 0453 .  acetaminophen (TYLENOL) tablet 650 mg, 650 mg, Oral, Q6H PRN, Hosie Poisson, MD, 650 mg at 01/11/18 1517 .  furosemide (LASIX) tablet 160 mg, 160 mg, Oral, BID, Deterding, James, MD, 160 mg at 01/14/18 1041 .  HYDROmorphone (DILAUDID) injection 1 mg, 1 mg, Intravenous, Q4H PRN, Robbie Lis, MD, 1 mg at 01/14/18 1041 .  ondansetron (ZOFRAN) injection 4 mg, 4 mg, Intravenous, Q6H PRN, Hosie Poisson, MD, 4 mg at 01/09/18 1917 .  piperacillin-tazobactam (ZOSYN) IVPB 3.375 g, 3.375 g, Intravenous, Q8H, Akula, Vijaya, MD, Last Rate: 12.5 mL/hr at 01/14/18 0511, 3.375 g at 01/14/18 0511 .  sodium bicarbonate tablet 1,300 mg, 1,300 mg, Oral, TID, Deterding, James, MD, 1,300 mg at 01/14/18 1041 .  sodium chloride flush (NS) 0.9 % injection 10-40 mL, 10-40 mL, Intracatheter, PRN, Hosie Poisson, MD, 10 mL at 01/13/18 1515 .  sodium chloride flush (NS) 0.9 % injection 5 mL, 5 mL, Intracatheter, Q8H, Hosie Poisson, MD, 5 mL at 01/14/18 0512    Vital Signs: BP 101/89 (BP Location: Left Arm)   Pulse 72   Temp 97.8 F (36.6 C) (Oral)   Resp 17   Ht 6' (1.829 m)   Wt 103.1 kg   LMP  (LMP Unknown)   SpO2 97%   BMI 30.83 kg/m   Physical Exam hepatic drain in place; dressing dry, site mildly tender 45 mL recorded past 24hrs, about 15 mL in bulb now. Hazy serosanguinous  Imaging: US Guided Needle Placement  Result Date: 01/11/2018 INDICATION:  Heterogeneous mass versus abscess of the liver with liquefied component. Presentation with abdominal pain and sepsis with positive blood cultures. EXAM: 1. ULTRASOUND-GUIDED CORE BIOPSY LIVER 2. ULTRASOUND GUIDED PERCUTANEOUS CATHETER DRAINAGE OF LIVER MEDICATIONS: The patient is currently admitted to the hospital and receiving intravenous antibiotics. The antibiotics were administered within an appropriate time frame prior to the initiation of the procedure. ANESTHESIA/SEDATION: Fentanyl 75 mcg IV; Versed 1.5 mg IV Moderate Sedation Time:  31 minutes. The patient was continuously monitored during the procedure by the interventional radiology nurse under my direct supervision. COMPLICATIONS: None immediate. PROCEDURE: Informed written consent was obtained from the patient after a thorough discussion of the procedural risks, benefits and alternatives. All questions were addressed. Maximal Sterile Barrier Technique was utilized including caps, mask, sterile gowns, sterile gloves, sterile drape, hand hygiene and skin antiseptic. A timeout was performed prior to the initiation of the procedure. Ultrasound was performed of the liver. Under ultrasound guidance, a 17 gauge needle was advanced into the right lobe. 18 gauge core biopsy samples were obtained with 3 samples submitted in formalin. The 17 gauge needle was then advanced into a liquified component. Aspiration of fluid was performed and sample sent for both culture analysis and cytology. A guidewire was advanced through the needle. The tract was dilated and a 10 French percutaneous drainage catheter advanced. Catheter position was confirmed by ultrasound. The catheter was connected to a suction bulb. The drainage catheter was secured  at the skin with a Prolene retention suture and adhesive StatLock device. FINDINGS: Large heterogeneous abnormality of the liver is present with ill-defined borders. The area of parenchymal abnormality spans at least an area 11 cm. The  echogenic rim surrounding a liquified component was sampled with tissue biopsy. Aspiration at the level of the liquefied component yielded bloody and turbid fluid. A 10 French drain was placed in this collection which is draining bloody fluid. IMPRESSION: Ultrasound-guided biopsy of the liver lesion along the edge of the liquefied component followed by aspiration of the liquefied component yielding bloody and turbid fluid which was sent for culture analysis and cytology. Additional placement of a 10 French percutaneous drainage catheter into the liquefied component which was attached to suction bulb drainage. Electronically Signed   By: Aletta Edouard M.D.   On: 01/11/2018 17:15   US Biopsy (liver)  Result Date: 01/11/2018 INDICATION: Heterogeneous mass versus abscess of the liver with liquefied component. Presentation with abdominal pain and sepsis with positive blood cultures. EXAM: 1. ULTRASOUND-GUIDED CORE BIOPSY LIVER 2. ULTRASOUND GUIDED PERCUTANEOUS CATHETER DRAINAGE OF LIVER MEDICATIONS: The patient is currently admitted to the hospital and receiving intravenous antibiotics. The antibiotics were administered within an appropriate time frame prior to the initiation of the procedure. ANESTHESIA/SEDATION: Fentanyl 75 mcg IV; Versed 1.5 mg IV Moderate Sedation Time:  31 minutes. The patient was continuously monitored during the procedure by the interventional radiology nurse under my direct supervision. COMPLICATIONS: None immediate. PROCEDURE: Informed written consent was obtained from the patient after a thorough discussion of the procedural risks, benefits and alternatives. All questions were addressed. Maximal Sterile Barrier Technique was utilized including caps, mask, sterile gowns, sterile gloves, sterile drape, hand hygiene and skin antiseptic. A timeout was performed prior to the initiation of the procedure. Ultrasound was performed of the liver. Under ultrasound guidance, a 17 gauge needle was  advanced into the right lobe. 18 gauge core biopsy samples were obtained with 3 samples submitted in formalin. The 17 gauge needle was then advanced into a liquified component. Aspiration of fluid was performed and sample sent for both culture analysis and cytology. A guidewire was advanced through the needle. The tract was dilated and a 10 French percutaneous drainage catheter advanced. Catheter position was confirmed by ultrasound. The catheter was connected to a suction bulb. The drainage catheter was secured at the skin with a Prolene retention suture and adhesive StatLock device. FINDINGS: Large heterogeneous abnormality of the liver is present with ill-defined borders. The area of parenchymal abnormality spans at least an area 11 cm. The echogenic rim surrounding a liquified component was sampled with tissue biopsy. Aspiration at the level of the liquefied component yielded bloody and turbid fluid. A 10 French drain was placed in this collection which is draining bloody fluid. IMPRESSION: Ultrasound-guided biopsy of the liver lesion along the edge of the liquefied component followed by aspiration of the liquefied component yielding bloody and turbid fluid which was sent for culture analysis and cytology. Additional placement of a 10 French percutaneous drainage catheter into the liquefied component which was attached to suction bulb drainage. Electronically Signed   By: Aletta Edouard M.D.   On: 01/11/2018 17:15   Dg Chest Port 1 View  Result Date: 01/11/2018 CLINICAL DATA:  Dyspnea. EXAM: PORTABLE CHEST 1 VIEW COMPARISON:  Radiograph of January 09, 2018. FINDINGS: Stable cardiomediastinal silhouette. No pneumothorax is noted. Left lung is clear. Right-sided PICC line is noted with distal tip in expected position of SVC. Mild right  basilar opacity is noted concerning for atelectasis or infiltrate with associated pleural effusion. Bony thorax is unremarkable. IMPRESSION: Interval development of mild right  basilar opacity concerning for atelectasis or infiltrate with possible associated pleural effusion. Interval placement of right-sided PICC line with distal tip in expected position of SVC. Electronically Signed   By: Marijo Conception, M.D.   On: 01/11/2018 15:27   [place] Ekg Site Rite  Result Date: 01/11/2018 If Site Rite image not attached, placement could not be confirmed due to current cardiac rhythm.   Labs:  CBC: Recent Labs    01/11/18 1829 01/12/18 0258 01/13/18 0400 01/14/18 0542  WBC 18.3* 23.0* 30.4* 37.1*  HGB 9.3* 8.1* 7.5* 8.0*  HCT 27.2* 24.1* 21.2* 22.4*  PLT 59* 42* 39* 58*    COAGS: Recent Labs    01/09/18 1151  INR 1.24    BMP: Recent Labs    01/11/18 2144 01/12/18 0258 01/13/18 0400 01/14/18 0350  NA 133* 133* 135 132*  K 3.4* 3.3* 3.2* 3.7  CL 105 104 107 103  CO2 17* 18* 16* 16*  GLUCOSE 123* 103* 102* 122*  BUN 70* 70* 73* 87*  CALCIUM 6.3* 7.2* 6.5* 6.8*  CREATININE 4.24* 4.27* 4.38* 5.18*  GFRNONAA 10* 10* 10* 8*  GFRAA 12* 12* 11* 9*    LIVER FUNCTION TESTS: Recent Labs    01/11/18 2144 01/12/18 0258 01/13/18 0400 01/14/18 0350  BILITOT 3.1* 3.2* 2.0* 1.9*  AST 669* 1,080* 554* 260*  ALT 487* 659* 549* 466*  ALKPHOS 106 114 148* 193*  PROT 4.7* 4.7* 4.6* 5.2*  ALBUMIN 2.1* 2.1* 1.8* 1.8*    Assessment and Plan: S/p hepatic mass/abscess bx/drainage 8/9;  Afebrile, WBC still trending up. Output from drain trending down. IR following. Rec repeat CT when output down to 36mL or less, unless otherwise clinically indicated.  Electronically Signed: Ascencion Dike, PA-C 01/14/2018, 10:46 AM   I spent a total of 15 minutes at the the patient's bedside AND on the patient's hospital floor or unit, greater than 50% of which was counseling/coordinating care for hepatic mass biopsy/abscess drain    Patient ID: Lisa Bauer, female   DOB: [birth year], 62 y.o.   MRN: [id number]

## 2018-01-15 LAB — COMPREHENSIVE METABOLIC PANEL
ALT: 335 U/L — AB (ref 0–44)
AST: 150 U/L — AB (ref 15–41)
Albumin: 1.8 g/dL — ABNORMAL LOW (ref 3.5–5.0)
Alkaline Phosphatase: 259 U/L — ABNORMAL HIGH (ref 38–126)
Anion gap: 14 (ref 5–15)
BUN: 91 mg/dL — AB (ref 8–23)
CO2: 18 mmol/L — ABNORMAL LOW (ref 22–32)
Calcium: 7.3 mg/dL — ABNORMAL LOW (ref 8.9–10.3)
Chloride: 101 mmol/L (ref 98–111)
Creatinine, Ser: 5.42 mg/dL — ABNORMAL HIGH (ref 0.44–1.00)
GFR, EST AFRICAN AMERICAN: 9 mL/min — AB (ref >60)
GFR, EST NON AFRICAN AMERICAN: 8 mL/min — AB (ref >60)
Glucose, Bld: 122 mg/dL — ABNORMAL HIGH (ref 70–99)
POTASSIUM: 3.5 mmol/L (ref 3.5–5.1)
Sodium: 133 mmol/L — ABNORMAL LOW (ref 135–145)
TOTAL PROTEIN: 5.5 g/dL — AB (ref 6.5–8.1)
Total Bilirubin: 1.5 mg/dL — ABNORMAL HIGH (ref 0.3–1.2)

## 2018-01-15 LAB — C4 COMPLEMENT: Complement C4, Body Fluid: 14 mg/dL (ref 14–44)

## 2018-01-15 LAB — C3 COMPLEMENT: C3 COMPLEMENT: 59 mg/dL — AB (ref 82–167)

## 2018-01-15 LAB — CBC
HCT: 21.7 % — ABNORMAL LOW (ref 36.0–46.0)
Hemoglobin: 7.8 g/dL — ABNORMAL LOW (ref 12.0–15.0)
MCH: 29.4 pg (ref 26.0–34.0)
MCHC: 35.9 g/dL (ref 30.0–36.0)
MCV: 81.9 fL (ref 78.0–100.0)
Platelets: 80 10*3/uL — ABNORMAL LOW (ref 150–400)
RBC: 2.65 MIL/uL — ABNORMAL LOW (ref 3.87–5.11)
RDW: 14.7 % (ref 11.5–15.5)
WBC: 39 10*3/uL — ABNORMAL HIGH (ref 4.0–10.5)

## 2018-01-15 LAB — PHOSPHORUS: PHOSPHORUS: 5.7 mg/dL — AB (ref 2.5–4.6)

## 2018-01-15 NOTE — Care Management Note (Signed)
Case Management Note Previous CM note completed by Zenon Mayo, RN 01/11/2018, 4:25 PM   Patient Details  Name: Lisa Bauer MRN: [id number] Date of Birth: [birth year]  Subjective/Objective:   From home with spouse, presents with hepatic abscess, acute renal failure, severe sepsis with septic shock, bacteremia due to dtreptococcus, elevated lft's, hypokalemia, metabolic acidosis, anemi, hypophosphatemia.  She is s/p  US guided bx of liver and catheter drainage.                 Action/Plan: NCM will follow for transition of care needs.   Expected Discharge Date:                  Expected Discharge Plan:  [place]  In-House Referral:     Discharge planning Services  CM Consult  Post Acute Care Choice:  Home Health, Durable Medical Equipment Choice offered to:  Patient, Adult Children  DME Arranged:  3-N-1, [place] rolling DME Agency:  [place]:  RN [facility] Agency:     Status of Service:     If discussed at [place] of Stay Meetings, dates discussed:    Discharge Disposition: home/home health   Additional Comments:  01/15/18- 1100- [name] RN, CM- noted pt will need 2 wks of IV abx at discharge per ID. Spoke with pt at bedside along with adult sons. Discussed transition of care needs for home for IV abx and DME needs. Pt has PICC line in place, requesting RW and 3n1 for home. Choice offered for [facility] agency for [facility] and iv abx needs- per pt and sons they have used [facility] in past for another family member- would like to use them this time for [facility] needs- for both [facility] and pharmacy infusion needs. Referral called to [facility] for [facility] and DME needs- will need MD to place [facility] order and DME for RW and 3n1- OPAT orders will also need to be completed for IV abx. - CM also notified Pam with [facility] for IV abx needs. CM will continue to follow for transition of care needs  Dawayne Patricia, RN 01/15/2018, 11:09 AM

## 2018-01-15 NOTE — Progress Notes (Signed)
PROGRESS NOTE    Lisa Bauer  JQB:[id number] DOB: 13-Jun-1955 DOA: 01/09/2018  PCP: Wenda Low, MD   Brief Narrative:  62 year old lady with prior h/o  comes in for fever, abdominal pain, was found to have hypotension and elevated lactic acid. Further evaluation with a CT abdomen showed heterogenous liver mass. She was initially admitted by [facility] for septic shock and briefly required pressors.  She is off the pressors and transferred to [facility] service on 8/9.    Assessment & Plan:   Septic shock / Streptococcus bacteremia / Leukocytosis - Thought to be due to possible liver abscess - Blood cx growing streptococcus - Underwent drainage placement 8/9, biospy negative for malignancy - WBC count up, abx changed to rocehin per ID to be continued to 15 more days starting 8/13 so through 8/28, repeat CT scan thereafter and determine the further course of abx  Normocytic anemia - Monitor CBC   LE swelling - Likely from fluid resuscitation for septic shock - Stopped IV fluids 8/12 - Lasix per renal recommendations   Acute kidney injury with metabolic acidosis - ATN or pre-renal etiology from septic shock - Per renal this seems to be likely etiology as well - Cr trending up, pt on lasix and sodium bicarb - Follow up daily BMP  Hypokalemia / Hypomagnesemia and hypophosphatemia - Supplemented   Thrombocytopenia - S/P 2 U platelets - Platelets improving   Heterogenous liver mass - Follow up liver bx results - path results negative for malignancy    DVT prophylaxis: SCD's Code Status: full code  Family Communication: family at the bedside this am Disposition Plan: patient acute, not yet stable for discharge    Consultants:   Radiology  Infectious diease  Procedures:   Liver bx  Antimicrobials:   Zosyn 8/8 --> 8/12  Rocephin 8/12 -->   Subjective: No overnight events.  Objective: Vitals:   01/14/18 1700 01/14/18 1958 01/15/18 0036 01/15/18 0459  BP:   (!) 147/84 123/75 137/84  Pulse:  76 (!) 112   Resp: 12 (!) 22 (!) 22 (!) 21  Temp:  98.4 F (36.9 C) 98.3 F (36.8 C) 98.6 F (37 C)  TempSrc:  Oral Oral Oral  SpO2:  95% 95% 98%  Weight:      Height:        Intake/Output Summary (Last 24 hours) at 01/15/2018 1111 Last data filed at 01/15/2018 0800 Gross per 24 hour  Intake 960 ml  Output 3250 ml  Net -2290 ml   Filed Weights   01/12/18 0450 01/13/18 0426 01/14/18 0430  Weight: 98.7 kg 101.1 kg 103.1 kg    Examination:  Physical Exam  Constitutional: Appears well-developed and well-nourished. No distress.  HENT: Normocephalic. External right and left ear normal. Oropharynx is clear and moist.  Eyes: Conjunctivae and EOM are normal. PERRLA, no scleral icterus.  Neck: Normal ROM. Neck supple. No JVD.  CVS: Rate controlled, S1/S2 + Pulmonary: no wheezing, no rhonchi  Abdominal: Soft. BS +,  Distended, some tenderness to palpation Musculoskeletal: Normal range of motion. LE edema appreciated.   Lymphadenopathy: No lymphadenopathy noted, cervical, inguinal. Neuro: Alert. Normal reflexes, muscle tone coordination. No cranial nerve deficit. Skin: Skin is warm and dry. No rash noted.  Psychiatric: Normal mood and affect. Behavior, judgment, thought content normal.      Data Reviewed: I have personally reviewed following labs and imaging studies  CBC: Recent Labs  Lab 01/09/18 1151  01/11/18 1829 01/12/18 0258 01/13/18 0400 01/14/18  0542 01/15/18 0700  WBC 9.9   < > 18.3* 23.0* 30.4* 37.1* 39.0*  NEUTROABS 8.4*  --   --   --  27.4*  --   --   HGB 12.3   < > 9.3* 8.1* 7.5* 8.0* 7.8*  HCT 36.6   < > 27.2* 24.1* 21.2* 22.4* 21.7*  MCV 87.6   < > 85.3 86.1 83.1 82.7 81.9  PLT 93*   < > 59* 42* 39* 58* 80*   < > = values in this interval not displayed.   Basic Metabolic Panel: Recent Labs  Lab 01/09/18 1443  01/10/18 0228 01/11/18 0356 01/11/18 2144 01/12/18 0258 01/13/18 0400 01/14/18 0350 01/15/18 0500    NA  --    < > 134* 133* 133* 133* 135 132* 133*  K  --    < > 2.8* 3.4* 3.4* 3.3* 3.2* 3.7 3.5  CL  --    < > 99 101 105 104 107 103 101  CO2  --    < > 21* 17* 17* 18* 16* 16* 18*  GLUCOSE  --    < > 86 110* 123* 103* 102* 122* 122*  BUN  --    < > 45* 63* 70* 70* 73* 87* 91*  CREATININE  --    < > 3.01* 3.72* 4.24* 4.27* 4.38* 5.18* 5.42*  CALCIUM  --    < > 6.8* 6.7* 6.3* 7.2* 6.5* 6.8* 7.3*  MG 1.6*  --  1.6* 2.4  --   --   --   --   --   PHOS  --   --  2.4* 3.8  --   --   --   --  5.7*   < > = values in this interval not displayed.   GFR: Estimated Creatinine Clearance: 14.5 mL/min (A) (by C-G formula based on SCr of 5.42 mg/dL (H)). Liver Function Tests: Recent Labs  Lab 01/11/18 2144 01/12/18 0258 01/13/18 0400 01/14/18 0350 01/15/18 0500  AST 669* 1,080* 554* 260* 150*  ALT 487* 659* 549* 466* 335*  ALKPHOS 106 114 148* 193* 259*  BILITOT 3.1* 3.2* 2.0* 1.9* 1.5*  PROT 4.7* 4.7* 4.6* 5.2* 5.5*  ALBUMIN 2.1* 2.1* 1.8* 1.8* 1.8*   Recent Labs  Lab 01/09/18 1151  LIPASE 24   No results for input(s): AMMONIA in the last 168 hours. Coagulation Profile: Recent Labs  Lab 01/09/18 1151  INR 1.24   Cardiac Enzymes: No results for input(s): CKTOTAL, CKMB, CKMBINDEX, TROPONINI in the last 168 hours. BNP (last 3 results) No results for input(s): PROBNP in the last 8760 hours. HbA1C: No results for input(s): HGBA1C in the last 72 hours. CBG: Recent Labs  Lab 01/09/18 1640 01/10/18 0843 01/10/18 1624  GLUCAP 103* 95 125*   Lipid Profile: No results for input(s): CHOL, HDL, LDLCALC, TRIG, CHOLHDL, LDLDIRECT in the last 72 hours. Thyroid Function Tests: No results for input(s): TSH, T4TOTAL, FREET4, T3FREE, THYROIDAB in the last 72 hours. Anemia Panel: No results for input(s): VITAMINB12, FOLATE, FERRITIN, TIBC, IRON, RETICCTPCT in the last 72 hours. Urine analysis:    Component Value Date/Time   COLORURINE YELLOW 01/14/2018 1300   APPEARANCEUR HAZY (A)  01/14/2018 1300   LABSPEC 1.010 01/14/2018 1300   PHURINE 5.0 01/14/2018 1300   GLUCOSEU NEGATIVE 01/14/2018 1300   HGBUR SMALL (A) 01/14/2018 1300   BILIRUBINUR NEGATIVE 01/14/2018 1300   KETONESUR NEGATIVE 01/14/2018 1300   PROTEINUR NEGATIVE 01/14/2018 1300   NITRITE NEGATIVE 01/14/2018 1300  LEUKOCYTESUR NEGATIVE 01/14/2018 1300   Sepsis Labs: @LABRCNTIP (procalcitonin:4,lacticidven:4)   ) Recent Results (from the past 240 hour(s))  Blood Culture (routine x 2)     Status: Abnormal   Collection Time: 01/09/18 11:40 AM  Result Value Ref Range Status   Specimen Description BLOOD RIGHT ANTECUBITAL  Final   Special Requests   Final    BOTTLES DRAWN AEROBIC AND ANAEROBIC Blood Culture adequate volume   Culture  Setup Time   Final    GRAM POSITIVE COCCI IN CHAINS IN BOTH AEROBIC AND ANAEROBIC BOTTLES CRITICAL RESULT CALLED TO, READ BACK BY AND VERIFIED WITH: Laurice Record PharmD 16:10 01/10/18 ([email or web address])    Culture (A)  Final    VIRIDANS STREPTOCOCCUS SUSCEPTIBILITIES PERFORMED ON PREVIOUS CULTURE WITHIN THE LAST 5 DAYS. Performed at [facility], [place] [address], [place], [place] [address]    Report Status 01/13/2018 FINAL  Final  Blood Culture ID Panel (Reflexed)     Status: Abnormal   Collection Time: 01/09/18 11:40 AM  Result Value Ref Range Status   Enterococcus species NOT DETECTED NOT DETECTED Final   Listeria monocytogenes NOT DETECTED NOT DETECTED Final   Staphylococcus species NOT DETECTED NOT DETECTED Final   Staphylococcus aureus NOT DETECTED NOT DETECTED Final   Streptococcus species DETECTED (A) NOT DETECTED Final    Comment: Not Enterococcus species, Streptococcus agalactiae, Streptococcus pyogenes, or Streptococcus pneumoniae. CRITICAL RESULT CALLED TO, READ BACK BY AND VERIFIED WITH: Laurice Record PharmD 16:10 01/10/18 ([email or web address])    Streptococcus agalactiae NOT DETECTED NOT DETECTED Final   Streptococcus pneumoniae NOT DETECTED NOT DETECTED Final   Streptococcus  pyogenes NOT DETECTED NOT DETECTED Final   Acinetobacter baumannii NOT DETECTED NOT DETECTED Final   Enterobacteriaceae species NOT DETECTED NOT DETECTED Final   Enterobacter cloacae complex NOT DETECTED NOT DETECTED Final   Escherichia coli NOT DETECTED NOT DETECTED Final   Klebsiella oxytoca NOT DETECTED NOT DETECTED Final   Klebsiella pneumoniae NOT DETECTED NOT DETECTED Final   Proteus species NOT DETECTED NOT DETECTED Final   Serratia marcescens NOT DETECTED NOT DETECTED Final   Haemophilus influenzae NOT DETECTED NOT DETECTED Final   Neisseria meningitidis NOT DETECTED NOT DETECTED Final   Pseudomonas aeruginosa NOT DETECTED NOT DETECTED Final   Candida albicans NOT DETECTED NOT DETECTED Final   Candida glabrata NOT DETECTED NOT DETECTED Final   Candida krusei NOT DETECTED NOT DETECTED Final   Candida parapsilosis NOT DETECTED NOT DETECTED Final   Candida tropicalis NOT DETECTED NOT DETECTED Final    Comment: Performed at [facility], [place] [address], [place], [place] [address]  Blood Culture (routine x 2)     Status: Abnormal   Collection Time: 01/09/18 11:50 AM  Result Value Ref Range Status   Specimen Description BLOOD LEFT ANTECUBITAL  Final   Special Requests   Final    BOTTLES DRAWN AEROBIC AND ANAEROBIC Blood Culture adequate volume   Culture  Setup Time   Final    GRAM POSITIVE COCCI IN CHAINS ANAEROBIC BOTTLE ONLY CRITICAL VALUE NOTED.  VALUE IS CONSISTENT WITH PREVIOUSLY REPORTED AND CALLED VALUE. Performed at [facility], [place] [address], [place],  [address]    Culture VIRIDANS STREPTOCOCCUS (A)  Final   Report Status 01/13/2018 FINAL  Final   Organism ID, Bacteria VIRIDANS STREPTOCOCCUS  Final      Susceptibility   Viridans streptococcus - MIC*    PENICILLIN <=0.06 SENSITIVE Sensitive     ERYTHROMYCIN <=0.12 SENSITIVE Sensitive  LEVOFLOXACIN 0.5 SENSITIVE Sensitive     VANCOMYCIN 0.5 SENSITIVE Sensitive     * VIRIDANS STREPTOCOCCUS  MRSA  PCR Screening     Status: None   Collection Time: 01/09/18  4:31 PM  Result Value Ref Range Status   MRSA by PCR NEGATIVE NEGATIVE Final    Comment:        The GeneXpert MRSA Assay (FDA approved for NASAL specimens only), is one component of a comprehensive MRSA colonization surveillance program. It is not intended to diagnose MRSA infection nor to guide or monitor treatment for MRSA infections. Performed at [facility], [place] [address], [place], [place] [address]   Culture, blood (routine x 2)     Status: None (Preliminary result)   Collection Time: 01/11/18 12:18 AM  Result Value Ref Range Status   Specimen Description BLOOD RIGHT PICC LINE  Final   Special Requests   Final    BOTTLES DRAWN AEROBIC AND ANAEROBIC Blood Culture adequate volume   Culture   Final    NO GROWTH 3 DAYS Performed at [facility], [place] [address], [place], [place] [address]    Report Status PENDING  Incomplete  Aerobic/Anaerobic Culture (surgical/deep wound)     Status: None (Preliminary result)   Collection Time: 01/11/18  2:27 PM  Result Value Ref Range Status   Specimen Description ABSCESS  Final   Special Requests Normal  Final   Gram Stain   Final    MODERATE WBC PRESENT, PREDOMINANTLY PMN MODERATE GRAM POSITIVE COCCI Performed at [facility], [place] [address], [place], [place] [address]    Culture   Final    FEW STREPTOCOCCUS GROUP F NO ANAEROBES ISOLATED; CULTURE IN PROGRESS FOR 5 DAYS    Report Status PENDING  Incomplete  Culture, blood (routine x 2)     Status: None (Preliminary result)   Collection Time: 01/11/18  9:29 PM  Result Value Ref Range Status   Specimen Description BLOOD BLOOD LEFT FOREARM  Final   Special Requests   Final    BOTTLES DRAWN AEROBIC ONLY Blood Culture results may not be optimal due to an inadequate volume of blood received in culture bottles   Culture   Final    NO GROWTH 4 DAYS Performed at [facility], [place] [address]  [place], [place] [address]    Report Status PENDING  Incomplete      Radiology Studies: US Guided Needle Placement  Result Date: 01/11/2018 INDICATION: Heterogeneous mass versus abscess of the liver with liquefied component. Presentation with abdominal pain and sepsis with positive blood cultures. EXAM: 1. ULTRASOUND-GUIDED CORE BIOPSY LIVER 2. ULTRASOUND GUIDED PERCUTANEOUS CATHETER DRAINAGE OF LIVER MEDICATIONS: The patient is currently admitted to the hospital and receiving intravenous antibiotics. The antibiotics were administered within an appropriate time frame prior to the initiation of the procedure. ANESTHESIA/SEDATION: Fentanyl 75 mcg IV; Versed 1.5 mg IV Moderate Sedation Time:  31 minutes. The patient was continuously monitored during the procedure by the interventional radiology nurse under my direct supervision. COMPLICATIONS: None immediate. PROCEDURE: Informed written consent was obtained from the patient after a thorough discussion of the procedural risks, benefits and alternatives. All questions were addressed. Maximal Sterile Barrier Technique was utilized including caps, mask, sterile gowns, sterile gloves, sterile drape, hand hygiene and skin antiseptic. A timeout was performed prior to the initiation of the procedure. Ultrasound was performed of the liver. Under ultrasound guidance, a 17 gauge needle was advanced into the right  lobe. 18 gauge core biopsy samples were obtained with 3 samples submitted in formalin. The 17 gauge needle was then advanced into a liquified component. Aspiration of fluid was performed and sample sent for both culture analysis and cytology. A guidewire was advanced through the needle. The tract was dilated and a 10 French percutaneous drainage catheter advanced. Catheter position was confirmed by ultrasound. The catheter was connected to a suction bulb. The drainage catheter was secured at the skin with a Prolene retention suture and adhesive StatLock device.  FINDINGS: Large heterogeneous abnormality of the liver is present with ill-defined borders. The area of parenchymal abnormality spans at least an area 11 cm. The echogenic rim surrounding a liquified component was sampled with tissue biopsy. Aspiration at the level of the liquefied component yielded bloody and turbid fluid. A 10 French drain was placed in this collection which is draining bloody fluid. IMPRESSION: Ultrasound-guided biopsy of the liver lesion along the edge of the liquefied component followed by aspiration of the liquefied component yielding bloody and turbid fluid which was sent for culture analysis and cytology. Additional placement of a 10 French percutaneous drainage catheter into the liquefied component which was attached to suction bulb drainage. Electronically Signed   By: Aletta Edouard M.D.   On: 01/11/2018 17:15   US Biopsy (liver)  Result Date: 01/11/2018 INDICATION: Heterogeneous mass versus abscess of the liver with liquefied component. Presentation with abdominal pain and sepsis with positive blood cultures. EXAM: 1. ULTRASOUND-GUIDED CORE BIOPSY LIVER 2. ULTRASOUND GUIDED PERCUTANEOUS CATHETER DRAINAGE OF LIVER MEDICATIONS: The patient is currently admitted to the hospital and receiving intravenous antibiotics. The antibiotics were administered within an appropriate time frame prior to the initiation of the procedure. ANESTHESIA/SEDATION: Fentanyl 75 mcg IV; Versed 1.5 mg IV Moderate Sedation Time:  31 minutes. The patient was continuously monitored during the procedure by the interventional radiology nurse under my direct supervision. COMPLICATIONS: None immediate. PROCEDURE: Informed written consent was obtained from the patient after a thorough discussion of the procedural risks, benefits and alternatives. All questions were addressed. Maximal Sterile Barrier Technique was utilized including caps, mask, sterile gowns, sterile gloves, sterile drape, hand hygiene and skin  antiseptic. A timeout was performed prior to the initiation of the procedure. Ultrasound was performed of the liver. Under ultrasound guidance, a 17 gauge needle was advanced into the right lobe. 18 gauge core biopsy samples were obtained with 3 samples submitted in formalin. The 17 gauge needle was then advanced into a liquified component. Aspiration of fluid was performed and sample sent for both culture analysis and cytology. A guidewire was advanced through the needle. The tract was dilated and a 10 French percutaneous drainage catheter advanced. Catheter position was confirmed by ultrasound. The catheter was connected to a suction bulb. The drainage catheter was secured at the skin with a Prolene retention suture and adhesive StatLock device. FINDINGS: Large heterogeneous abnormality of the liver is present with ill-defined borders. The area of parenchymal abnormality spans at least an area 11 cm. The echogenic rim surrounding a liquified component was sampled with tissue biopsy. Aspiration at the level of the liquefied component yielded bloody and turbid fluid. A 10 French drain was placed in this collection which is draining bloody fluid. IMPRESSION: Ultrasound-guided biopsy of the liver lesion along the edge of the liquefied component followed by aspiration of the liquefied component yielding bloody and turbid fluid which was sent for culture analysis and cytology. Additional placement of a 10 French percutaneous drainage catheter into the  liquefied component which was attached to suction bulb drainage. Electronically Signed   By: Aletta Edouard M.D.   On: 01/11/2018 17:15   Dg Chest Port 1 View  Result Date: 01/11/2018 CLINICAL DATA:  Dyspnea. EXAM: PORTABLE CHEST 1 VIEW COMPARISON:  Radiograph of January 09, 2018. FINDINGS: Stable cardiomediastinal silhouette. No pneumothorax is noted. Left lung is clear. Right-sided PICC line is noted with distal tip in expected position of SVC. Mild right basilar  opacity is noted concerning for atelectasis or infiltrate with associated pleural effusion. Bony thorax is unremarkable. IMPRESSION: Interval development of mild right basilar opacity concerning for atelectasis or infiltrate with possible associated pleural effusion. Interval placement of right-sided PICC line with distal tip in expected position of SVC. Electronically Signed   By: Marijo Conception, M.D.   On: 01/11/2018 15:27        Scheduled Meds: . sodium bicarbonate  1,300 mg Oral TID  . sodium chloride flush  5 mL Intracatheter Q8H   Continuous Infusions: . sodium chloride Stopped (01/12/18 0453)  . cefTRIAXone (ROCEPHIN)  IV 2 g (01/14/18 1320)     LOS: 6 days    Time spent: 25 minutes Greater than 50% of the time spent on counseling and coordinating the care.   Leisa Lenz, MD [facility] Pager [phone]  If 7PM-7AM, please contact night-coverage [email or web address] Password [email or web address] 01/15/2018, 11:11 AM

## 2018-01-15 NOTE — Progress Notes (Signed)
Subjective: Interval History: has no complaint, still stiff. weak.  Objective: Vital signs in last 24 hours: Temp:  [98.3 F (36.8 C)-98.6 F (37 C)] 98.6 F (37 C) (08/13 0459) Pulse Rate:  [76-112] 112 (08/13 0036) Resp:  [12-22] 21 (08/13 0459) BP: (109-147)/(69-84) 137/84 (08/13 0459) SpO2:  [95 %-98 %] 98 % (08/13 0459) Weight change:   Intake/Output from previous day: 08/12 0701 - 08/13 0700 In: 960 [P.O.:960] Out: 3250 [Urine:3250] Intake/Output this shift: No intake/output data recorded.  General appearance: alert, cooperative and no distress Resp: diminished breath sounds bibasilar Cardio: S1, S2 normal and systolic murmur: systolic ejection 2/6, crescendo at 2nd left intercostal space GI: drain RUQ, pos bs, distended Extremities: edema 4 +  Lab Results: Recent Labs    01/13/18 0400 01/14/18 0542  WBC 30.4* 37.1*  HGB 7.5* 8.0*  HCT 21.2* 22.4*  PLT 39* 58*   BMET:  Recent Labs    01/14/18 0350 01/15/18 0500  NA 132* 133*  K 3.7 3.5  CL 103 101  CO2 16* 18*  GLUCOSE 122* 122*  BUN 87* 91*  CREATININE 5.18* 5.42*  CALCIUM 6.8* 7.3*   No results for input(s): PTH in the last 72 hours. Iron Studies: No results for input(s): IRON, TIBC, TRANSFERRIN, FERRITIN in the last 72 hours.  Studies/Results: No results found.  I have reviewed the patient's current medications.  Assessment/Plan: 1 AKI Cr trending up. . Bicarb better with po.  Responded to Lasix, diuresing, will hold and see if cont of Lasix.  Severe vol xs. Urine sediment not indic inflam 2 liver abscess drain, AB 3 Anemia  Stable 4 Nutrition suppl P follow Cr, urine vol. Bicarb.  Cont AB, drain    LOS: 6 days   Jeneen Rinks Bruce Churilla 01/15/2018,9:17 AM

## 2018-01-16 LAB — RENAL FUNCTION PANEL
ALBUMIN: 1.8 g/dL — AB (ref 3.5–5.0)
Anion gap: 13 (ref 5–15)
BUN: 93 mg/dL — AB (ref 8–23)
CALCIUM: 7.3 mg/dL — AB (ref 8.9–10.3)
CHLORIDE: 100 mmol/L (ref 98–111)
CO2: 20 mmol/L — ABNORMAL LOW (ref 22–32)
Creatinine, Ser: 4.99 mg/dL — ABNORMAL HIGH (ref 0.44–1.00)
GFR calc Af Amer: 10 mL/min — ABNORMAL LOW (ref >60)
GFR, EST NON AFRICAN AMERICAN: 8 mL/min — AB (ref >60)
Glucose, Bld: 131 mg/dL — ABNORMAL HIGH (ref 70–99)
Phosphorus: 6.1 mg/dL — ABNORMAL HIGH (ref 2.5–4.6)
Potassium: 3.4 mmol/L — ABNORMAL LOW (ref 3.5–5.1)
SODIUM: 133 mmol/L — AB (ref 135–145)

## 2018-01-16 LAB — AEROBIC/ANAEROBIC CULTURE (SURGICAL/DEEP WOUND): SPECIAL REQUESTS: NORMAL

## 2018-01-16 LAB — AEROBIC/ANAEROBIC CULTURE W GRAM STAIN (SURGICAL/DEEP WOUND)

## 2018-01-16 LAB — CBC
HCT: 20.7 % — ABNORMAL LOW (ref 36.0–46.0)
Hemoglobin: 7.6 g/dL — ABNORMAL LOW (ref 12.0–15.0)
MCH: 29.7 pg (ref 26.0–34.0)
MCHC: 36.7 g/dL — ABNORMAL HIGH (ref 30.0–36.0)
MCV: 80.9 fL (ref 78.0–100.0)
PLATELETS: 112 10*3/uL — AB (ref 150–400)
RBC: 2.56 MIL/uL — AB (ref 3.87–5.11)
RDW: 14.4 % (ref 11.5–15.5)
WBC: 37.7 10*3/uL — AB (ref 4.0–10.5)

## 2018-01-16 LAB — CULTURE, BLOOD (ROUTINE X 2): CULTURE: NO GROWTH

## 2018-01-16 MED ORDER — POTASSIUM CHLORIDE CRYS ER 20 MEQ PO TBCR
40.0000 meq | EXTENDED_RELEASE_TABLET | Freq: Once | ORAL | Status: AC
  Administered 2018-01-16: 40 meq via ORAL
  Filled 2018-01-16: qty 2

## 2018-01-16 NOTE — Progress Notes (Signed)
PROGRESS NOTE    DAZHA KEMPA  CHE:[id number] DOB: 1956/01/01 DOA: 01/09/2018  PCP: Wenda Low, MD   Brief Narrative:  62 year old lady with prior h/o  comes in for fever, abdominal pain, was found to have hypotension and elevated lactic acid. Further evaluation with a CT abdomen showed heterogenous liver mass. She was initially admitted by [facility] for septic shock and briefly required pressors.  She is off the pressors and transferred to [facility] service on 8/9.    Assessment & Plan:   Septic shock / Streptococcus bacteremia / Leukocytosis - Thought to be due to possible liver abscess - Blood cx growing streptococcus - Underwent drainage placement 8/9, biospy negative for malignancy - Antibiotics changed to rocehin per ID to be continued to 15 more days starting 8/13 so through 8/28, repeat CT scan thereafter and determine the further course of abx - WBC count slowly improving   Normocytic anemia - hgb stable - Transfuse for hgb less than 7  LE swelling - Likely from fluid resuscitation for septic shock - Stopped IV fluids 8/12 - Given lasix per renal but currently not on lasix and diuresing adequately   Acute kidney injury with metabolic acidosis - ATN or pre-renal etiology from septic shock - Per renal this seems to be likely etiology as well - Cr has reached plateau at 5.4 and now improving   Hypokalemia / Hypomagnesemia and hypophosphatemia - Supplemented   Thrombocytopenia - S/P 2 U platelets - Platelets improving   Heterogenous liver mass - Follow up liver bx results - path results negative for malignancy    DVT prophylaxis: SCD's Code Status: full code  Family Communication: family at bedside this am Disposition Plan: home once pt improves in regards to renal failure and WBC count    Consultants:   Radiology  Infectious diease  Procedures:   Liver bx  Antimicrobials:   Zosyn 8/8 --> 8/12  Rocephin 8/12 -->   Subjective: No overnight  events..  Objective: Vitals:   01/15/18 2011 01/15/18 2352 01/16/18 0320 01/16/18 0751  BP: 134/76 117/74 132/74 (!) 161/77  Pulse: 74 71  72  Resp:  10  17  Temp: 98.6 F (37 C) 98 F (36.7 C) 98.3 F (36.8 C) 98.2 F (36.8 C)  TempSrc: Oral Oral Tympanic Oral  SpO2: 97% 94%  98%  Weight:   99.7 kg   Height:        Intake/Output Summary (Last 24 hours) at 01/16/2018 0854 Last data filed at 01/16/2018 0757 Gross per 24 hour  Intake 940 ml  Output 5171 ml  Net -4231 ml   Filed Weights   01/13/18 0426 01/14/18 0430 01/16/18 0320  Weight: 101.1 kg 103.1 kg 99.7 kg    Examination:  Constitutional: Appears well-developed and well-nourished. No distress.  HENT: Normocephalic. External right and left ear normal. Oropharynx is clear and moist.  Eyes: Conjunctivae and EOM are normal. PERRLA, no scleral icterus.  Neck: Normal ROM. Neck supple. No JVD. No tracheal deviation. No thyromegaly.  CVS: Rate controlled, S1/S2 + Pulmonary: diminished breath sounds but no wheezing  Abdominal: Soft. BS +,  no distension, tenderness at the site where biopsy was done Musculoskeletal: Normal range of motion. LE edema appreciated .  Lymphadenopathy: No lymphadenopathy noted, cervical, inguinal. Neuro: Alert. Normal reflexes, muscle tone coordination. No cranial nerve deficit. Skin: Skin is warm and dry.  Psychiatric: Normal mood and affect. Behavior, judgment, thought content normal.    Data Reviewed: I have personally reviewed  following labs and imaging studies  CBC: Recent Labs  Lab 01/09/18 1151  01/12/18 0258 01/13/18 0400 01/14/18 0542 01/15/18 0700 01/16/18 0345  WBC 9.9   < > 23.0* 30.4* 37.1* 39.0* 37.7*  NEUTROABS 8.4*  --   --  27.4*  --   --   --   HGB 12.3   < > 8.1* 7.5* 8.0* 7.8* 7.6*  HCT 36.6   < > 24.1* 21.2* 22.4* 21.7* 20.7*  MCV 87.6   < > 86.1 83.1 82.7 81.9 80.9  PLT 93*   < > 42* 39* 58* 80* 112*   < > = values in this interval not displayed.   Basic  Metabolic Panel: Recent Labs  Lab 01/09/18 1443  01/10/18 0228 01/11/18 0356  01/12/18 0258 01/13/18 0400 01/14/18 0350 01/15/18 0500 01/16/18 0345  NA  --    < > 134* 133*   < > 133* 135 132* 133* 133*  K  --    < > 2.8* 3.4*   < > 3.3* 3.2* 3.7 3.5 3.4*  CL  --    < > 99 101   < > 104 107 103 101 100  CO2  --    < > 21* 17*   < > 18* 16* 16* 18* 20*  GLUCOSE  --    < > 86 110*   < > 103* 102* 122* 122* 131*  BUN  --    < > 45* 63*   < > 70* 73* 87* 91* 93*  CREATININE  --    < > 3.01* 3.72*   < > 4.27* 4.38* 5.18* 5.42* 4.99*  CALCIUM  --    < > 6.8* 6.7*   < > 7.2* 6.5* 6.8* 7.3* 7.3*  MG 1.6*  --  1.6* 2.4  --   --   --   --   --   --   PHOS  --   --  2.4* 3.8  --   --   --   --  5.7* 6.1*   < > = values in this interval not displayed.   GFR: Estimated Creatinine Clearance: 15.4 mL/min (A) (by C-G formula based on SCr of 4.99 mg/dL (H)). Liver Function Tests: Recent Labs  Lab 01/11/18 2144 01/12/18 0258 01/13/18 0400 01/14/18 0350 01/15/18 0500 01/16/18 0345  AST 669* 1,080* 554* 260* 150*  --   ALT 487* 659* 549* 466* 335*  --   ALKPHOS 106 114 148* 193* 259*  --   BILITOT 3.1* 3.2* 2.0* 1.9* 1.5*  --   PROT 4.7* 4.7* 4.6* 5.2* 5.5*  --   ALBUMIN 2.1* 2.1* 1.8* 1.8* 1.8* 1.8*   Recent Labs  Lab 01/09/18 1151  LIPASE 24   No results for input(s): AMMONIA in the last 168 hours. Coagulation Profile: Recent Labs  Lab 01/09/18 1151  INR 1.24   Cardiac Enzymes: No results for input(s): CKTOTAL, CKMB, CKMBINDEX, TROPONINI in the last 168 hours. BNP (last 3 results) No results for input(s): PROBNP in the last 8760 hours. HbA1C: No results for input(s): HGBA1C in the last 72 hours. CBG: Recent Labs  Lab 01/09/18 1640 01/10/18 0843 01/10/18 1624  GLUCAP 103* 95 125*   Lipid Profile: No results for input(s): CHOL, HDL, LDLCALC, TRIG, CHOLHDL, LDLDIRECT in the last 72 hours. Thyroid Function Tests: No results for input(s): TSH, T4TOTAL, FREET4, T3FREE,  THYROIDAB in the last 72 hours. Anemia Panel: No results for input(s): VITAMINB12, FOLATE, FERRITIN, TIBC, IRON, RETICCTPCT  in the last 72 hours. Urine analysis:    Component Value Date/Time   COLORURINE YELLOW 01/14/2018 1300   APPEARANCEUR HAZY (A) 01/14/2018 1300   LABSPEC 1.010 01/14/2018 1300   PHURINE 5.0 01/14/2018 1300   GLUCOSEU NEGATIVE 01/14/2018 1300   HGBUR SMALL (A) 01/14/2018 1300   BILIRUBINUR NEGATIVE 01/14/2018 1300   KETONESUR NEGATIVE 01/14/2018 1300   PROTEINUR NEGATIVE 01/14/2018 1300   NITRITE NEGATIVE 01/14/2018 1300   LEUKOCYTESUR NEGATIVE 01/14/2018 1300   Sepsis Labs: @LABRCNTIP (procalcitonin:4,lacticidven:4)   ) Recent Results (from the past 240 hour(s))  Blood Culture (routine x 2)     Status: Abnormal   Collection Time: 01/09/18 11:40 AM  Result Value Ref Range Status   Specimen Description BLOOD RIGHT ANTECUBITAL  Final   Special Requests   Final    BOTTLES DRAWN AEROBIC AND ANAEROBIC Blood Culture adequate volume   Culture  Setup Time   Final    GRAM POSITIVE COCCI IN CHAINS IN BOTH AEROBIC AND ANAEROBIC BOTTLES CRITICAL RESULT CALLED TO, READ BACK BY AND VERIFIED WITH: Laurice Record PharmD 16:10 01/10/18 ([email or web address])    Culture (A)  Final    VIRIDANS STREPTOCOCCUS SUSCEPTIBILITIES PERFORMED ON PREVIOUS CULTURE WITHIN THE LAST 5 DAYS. Performed at [facility], [place] [address], [place], [place] [address]    Report Status 01/13/2018 FINAL  Final  Blood Culture ID Panel (Reflexed)     Status: Abnormal   Collection Time: 01/09/18 11:40 AM  Result Value Ref Range Status   Enterococcus species NOT DETECTED NOT DETECTED Final   Listeria monocytogenes NOT DETECTED NOT DETECTED Final   Staphylococcus species NOT DETECTED NOT DETECTED Final   Staphylococcus aureus NOT DETECTED NOT DETECTED Final   Streptococcus species DETECTED (A) NOT DETECTED Final    Comment: Not Enterococcus species, Streptococcus agalactiae, Streptococcus pyogenes, or  Streptococcus pneumoniae. CRITICAL RESULT CALLED TO, READ BACK BY AND VERIFIED WITH: Laurice Record PharmD 16:10 01/10/18 ([email or web address])    Streptococcus agalactiae NOT DETECTED NOT DETECTED Final   Streptococcus pneumoniae NOT DETECTED NOT DETECTED Final   Streptococcus pyogenes NOT DETECTED NOT DETECTED Final   Acinetobacter baumannii NOT DETECTED NOT DETECTED Final   Enterobacteriaceae species NOT DETECTED NOT DETECTED Final   Enterobacter cloacae complex NOT DETECTED NOT DETECTED Final   Escherichia coli NOT DETECTED NOT DETECTED Final   Klebsiella oxytoca NOT DETECTED NOT DETECTED Final   Klebsiella pneumoniae NOT DETECTED NOT DETECTED Final   Proteus species NOT DETECTED NOT DETECTED Final   Serratia marcescens NOT DETECTED NOT DETECTED Final   Haemophilus influenzae NOT DETECTED NOT DETECTED Final   Neisseria meningitidis NOT DETECTED NOT DETECTED Final   Pseudomonas aeruginosa NOT DETECTED NOT DETECTED Final   Candida albicans NOT DETECTED NOT DETECTED Final   Candida glabrata NOT DETECTED NOT DETECTED Final   Candida krusei NOT DETECTED NOT DETECTED Final   Candida parapsilosis NOT DETECTED NOT DETECTED Final   Candida tropicalis NOT DETECTED NOT DETECTED Final    Comment: Performed at [facility], [place] [address], [place],  [address]  Blood Culture (routine x 2)     Status: Abnormal   Collection Time: 01/09/18 11:50 AM  Result Value Ref Range Status   Specimen Description BLOOD LEFT ANTECUBITAL  Final   Special Requests   Final    BOTTLES DRAWN AEROBIC AND ANAEROBIC Blood Culture adequate volume   Culture  Setup Time   Final    GRAM POSITIVE COCCI IN CHAINS ANAEROBIC BOTTLE ONLY CRITICAL VALUE NOTED.  VALUE IS CONSISTENT WITH PREVIOUSLY REPORTED AND CALLED VALUE. Performed at [facility], [place] [address], [place], [place] [address]    Culture VIRIDANS STREPTOCOCCUS (A)  Final   Report Status 01/13/2018 FINAL  Final   Organism ID, Bacteria VIRIDANS STREPTOCOCCUS   Final      Susceptibility   Viridans streptococcus - MIC*    PENICILLIN <=0.06 SENSITIVE Sensitive     ERYTHROMYCIN <=0.12 SENSITIVE Sensitive     LEVOFLOXACIN 0.5 SENSITIVE Sensitive     VANCOMYCIN 0.5 SENSITIVE Sensitive     * VIRIDANS STREPTOCOCCUS  MRSA PCR Screening     Status: None   Collection Time: 01/09/18  4:31 PM  Result Value Ref Range Status   MRSA by PCR NEGATIVE NEGATIVE Final    Comment:        The GeneXpert MRSA Assay (FDA approved for NASAL specimens only), is one component of a comprehensive MRSA colonization surveillance program. It is not intended to diagnose MRSA infection nor to guide or monitor treatment for MRSA infections. Performed at [facility], [place] [address], [place], [place] [address]   Culture, blood (routine x 2)     Status: None (Preliminary result)   Collection Time: 01/11/18 12:18 AM  Result Value Ref Range Status   Specimen Description BLOOD RIGHT PICC LINE  Final   Special Requests   Final    BOTTLES DRAWN AEROBIC AND ANAEROBIC Blood Culture adequate volume   Culture   Final    NO GROWTH 3 DAYS Performed at [facility], [place] [address], [place], [place] [address]    Report Status PENDING  Incomplete  Aerobic/Anaerobic Culture (surgical/deep wound)     Status: None (Preliminary result)   Collection Time: 01/11/18  2:27 PM  Result Value Ref Range Status   Specimen Description ABSCESS  Final   Special Requests Normal  Final   Gram Stain   Final    MODERATE WBC PRESENT, PREDOMINANTLY PMN MODERATE GRAM POSITIVE COCCI Performed at [facility], [place] [address], [place], [place] [address]    Culture   Final    FEW STREPTOCOCCUS GROUP F NO ANAEROBES ISOLATED; CULTURE IN PROGRESS FOR 5 DAYS    Report Status PENDING  Incomplete  Culture, blood (routine x 2)     Status: None (Preliminary result)   Collection Time: 01/11/18  9:29 PM  Result Value Ref Range Status   Specimen Description BLOOD BLOOD LEFT FOREARM  Final    Special Requests   Final    BOTTLES DRAWN AEROBIC ONLY Blood Culture results may not be optimal due to an inadequate volume of blood received in culture bottles   Culture   Final    NO GROWTH 4 DAYS Performed at [facility], [place] [address], [place], [place] [address]    Report Status PENDING  Incomplete      Radiology Studies: No results found.      Scheduled Meds: . sodium bicarbonate  1,300 mg Oral TID  . sodium chloride flush  5 mL Intracatheter Q8H   Continuous Infusions: . sodium chloride Stopped (01/12/18 0453)  . cefTRIAXone (ROCEPHIN)  IV 2 g (01/15/18 1149)     LOS: 7 days    Time spent: 25 minutes Greater than 50% of the time spent on counseling and coordinating the care.   Leisa Lenz, MD [facility] Pager [phone]  If 7PM-7AM, please contact night-coverage [email or web address] Password [facility] 01/16/2018, 8:54 AM

## 2018-01-16 NOTE — Progress Notes (Signed)
Subjective: Interval History: has complaints weak.  Objective: Vital signs in last 24 hours: Temp:  [98 F (36.7 C)-98.6 F (37 C)] 98.2 F (36.8 C) (08/14 0751) Pulse Rate:  [71-113] 72 (08/14 0751) Resp:  [10-21] 17 (08/14 0751) BP: (117-161)/(74-84) 161/77 (08/14 0751) SpO2:  [79 %-98 %] 98 % (08/14 0751) Weight:  [99.7 kg] 99.7 kg (08/14 0320) Weight change:   Intake/Output from previous day: 08/13 0701 - 08/14 0700 In: 1180 [P.O.:1080; IV Piggyback:100] Out: 4671 [Urine:4600; Drains:70; Stool:1] Intake/Output this shift: Total I/O In: -  Out: 500 [Urine:500]  General appearance: alert, cooperative, no distress and pale Resp: diminished breath sounds bibasilar Cardio: S1, S2 normal and systolic murmur: systolic ejection 2/6, decrescendo at 2nd left intercostal space GI: pos bs, mod distension, drain RUQ Extremities: edema 4+  Lab Results: Recent Labs    01/15/18 0700 01/16/18 0345  WBC 39.0* 37.7*  HGB 7.8* 7.6*  HCT 21.7* 20.7*  PLT 80* 112*   BMET:  Recent Labs    01/15/18 0500 01/16/18 0345  NA 133* 133*  K 3.5 3.4*  CL 101 100  CO2 18* 20*  GLUCOSE 122* 131*  BUN 91* 93*  CREATININE 5.42* 4.99*  CALCIUM 7.3* 7.3*   No results for input(s): PTH in the last 72 hours. Iron Studies: No results for input(s): IRON, TIBC, TRANSFERRIN, FERRITIN in the last 72 hours.  Studies/Results: No results found.  I have reviewed the patient's current medications.  Assessment/Plan: 1 AKI appears improving.  Acid/base better with bicarb. Diuresing without more Lasix.  Cr improved 2 Liver abscess on AB 3 Severe debill 4 Volume overload diuresing 5 Anemia  P follow vol, urine , chem , mobilize.  Cont AB    LOS: 7 days   Jeneen Rinks English Tomer 01/16/2018,8:39 AM

## 2018-01-16 NOTE — Progress Notes (Signed)
Referring Physician(s): Alva,R  Supervising Physician: Dr. Earleen Newport  Patient Status:  Lisa Bauer - In-pt  Chief Complaint: Abdominal pain, hepatic mass/abscess   Subjective: Pt feeling better.  Drain site a little less sore. Family at bedside   Allergies: Patient has no known allergies.  Medications:  Current Facility-Administered Medications:  .  0.9 %  sodium chloride infusion, 250 mL, Intravenous, PRN, Hosie Poisson, MD, Stopped at 01/12/18 0453 .  acetaminophen (TYLENOL) tablet 650 mg, 650 mg, Oral, Q6H PRN, Hosie Poisson, MD, 650 mg at 01/11/18 1517 .  cefTRIAXone (ROCEPHIN) 2 g in sodium chloride 0.9 % 100 mL IVPB, 2 g, Intravenous, Q24H, Campbell Riches, MD, Last Rate: 200 mL/hr at 01/15/18 1149, 2 g at 01/15/18 1149 .  HYDROmorphone (DILAUDID) injection 1 mg, 1 mg, Intravenous, Q4H PRN, Robbie Lis, MD, 1 mg at 01/16/18 0240 .  ondansetron (ZOFRAN) injection 4 mg, 4 mg, Intravenous, Q6H PRN, Hosie Poisson, MD, 4 mg at 01/09/18 1917 .  sodium bicarbonate tablet 1,300 mg, 1,300 mg, Oral, TID, Deterding, James, MD, 1,300 mg at 01/16/18 9735 .  sodium chloride flush (NS) 0.9 % injection 10-40 mL, 10-40 mL, Intracatheter, PRN, Hosie Poisson, MD, 10 mL at 01/13/18 1515 .  sodium chloride flush (NS) 0.9 % injection 5 mL, 5 mL, Intracatheter, Q8H, Karleen Hampshire, Vijaya, MD, 5 mL at 01/15/18 1650    Vital Signs: BP (!) 161/77 (BP Location: Left Arm)   Pulse 72   Temp 98.2 F (36.8 C) (Oral)   Resp 17   Ht 6' (1.829 m)   Wt 99.7 kg   LMP  (LMP Unknown)   SpO2 98%   BMI 29.81 kg/m   Physical Exam hepatic drain in place; dressing dry, site mildly tender 70 mL recorded past 24hrs, about 20 mL in bulb now. Dark serosanguinous fluid in bulb  Imaging: No results found.  Labs:  CBC: Recent Labs    01/13/18 0400 01/14/18 0542 01/15/18 0700 01/16/18 0345  WBC 30.4* 37.1* 39.0* 37.7*  HGB 7.5* 8.0* 7.8* 7.6*  HCT 21.2* 22.4* 21.7* 20.7*  PLT 39* 58* 80* 112*     COAGS: Recent Labs    01/09/18 1151  INR 1.24    BMP: Recent Labs    01/13/18 0400 01/14/18 0350 01/15/18 0500 01/16/18 0345  NA 135 132* 133* 133*  K 3.2* 3.7 3.5 3.4*  CL 107 103 101 100  CO2 16* 16* 18* 20*  GLUCOSE 102* 122* 122* 131*  BUN 73* 87* 91* 93*  CALCIUM 6.5* 6.8* 7.3* 7.3*  CREATININE 4.38* 5.18* 5.42* 4.99*  GFRNONAA 10* 8* 8* 8*  GFRAA 11* 9* 9* 10*    LIVER FUNCTION TESTS: Recent Labs    01/12/18 0258 01/13/18 0400 01/14/18 0350 01/15/18 0500 01/16/18 0345  BILITOT 3.2* 2.0* 1.9* 1.5*  --   AST 1,080* 554* 260* 150*  --   ALT 659* 549* 466* 335*  --   ALKPHOS 114 148* 193* 259*  --   PROT 4.7* 4.6* 5.2* 5.5*  --   ALBUMIN 2.1* 1.8* 1.8* 1.8* 1.8*    Assessment and Plan: S/p hepatic mass/abscess bx/drainage 8/9;  Afebrile, WBC finally trending down a bit. IR following. Hopefully DC in the next few days. IR can setup outpt follow up CT scan and appt.  Electronically Signed: Ascencion Dike, PA-C 01/16/2018, 12:03 PM   I spent a total of 15 minutes at the the patient's bedside AND on the patient's hospital floor or unit, greater than 50%  of which was counseling/coordinating care for hepatic mass biopsy/abscess drain    Patient ID: Lisa Bauer, female   DOB: 07/27/1955, 62 y.o.   MRN: [id number]

## 2018-01-17 ENCOUNTER — Other Ambulatory Visit: Payer: Self-pay | Admitting: Physician Assistant

## 2018-01-17 DIAGNOSIS — K750 Abscess of liver: Secondary | ICD-10-CM

## 2018-01-17 LAB — CBC
HCT: 20.3 % — ABNORMAL LOW (ref 36.0–46.0)
HEMOGLOBIN: 7.3 g/dL — AB (ref 12.0–15.0)
MCH: 29.1 pg (ref 26.0–34.0)
MCHC: 36 g/dL (ref 30.0–36.0)
MCV: 80.9 fL (ref 78.0–100.0)
Platelets: 146 10*3/uL — ABNORMAL LOW (ref 150–400)
RBC: 2.51 MIL/uL — AB (ref 3.87–5.11)
RDW: 14 % (ref 11.5–15.5)
WBC: 35.6 10*3/uL — ABNORMAL HIGH (ref 4.0–10.5)

## 2018-01-17 LAB — IRON AND TIBC
Iron: 30 ug/dL (ref 28–170)
Saturation Ratios: 14 % (ref 10.4–31.8)
TIBC: 214 ug/dL — AB (ref 250–450)
UIBC: 184 ug/dL

## 2018-01-17 LAB — RENAL FUNCTION PANEL
ANION GAP: 13 (ref 5–15)
Albumin: 1.8 g/dL — ABNORMAL LOW (ref 3.5–5.0)
BUN: 86 mg/dL — ABNORMAL HIGH (ref 8–23)
CHLORIDE: 100 mmol/L (ref 98–111)
CO2: 22 mmol/L (ref 22–32)
Calcium: 7.2 mg/dL — ABNORMAL LOW (ref 8.9–10.3)
Creatinine, Ser: 4.24 mg/dL — ABNORMAL HIGH (ref 0.44–1.00)
GFR calc non Af Amer: 10 mL/min — ABNORMAL LOW (ref >60)
GFR, EST AFRICAN AMERICAN: 12 mL/min — AB (ref >60)
GLUCOSE: 138 mg/dL — AB (ref 70–99)
POTASSIUM: 3.2 mmol/L — AB (ref 3.5–5.1)
Phosphorus: 5.5 mg/dL — ABNORMAL HIGH (ref 2.5–4.6)
Sodium: 135 mmol/L (ref 135–145)

## 2018-01-17 LAB — MAGNESIUM: Magnesium: 2.2 mg/dL (ref 1.7–2.4)

## 2018-01-17 MED ORDER — POTASSIUM CHLORIDE CRYS ER 20 MEQ PO TBCR
40.0000 meq | EXTENDED_RELEASE_TABLET | Freq: Once | ORAL | Status: AC
  Administered 2018-01-17: 40 meq via ORAL
  Filled 2018-01-17: qty 2

## 2018-01-17 MED ORDER — OXYCODONE HCL 5 MG PO TABS
5.0000 mg | ORAL_TABLET | ORAL | Status: DC | PRN
  Administered 2018-01-17 – 2018-01-24 (×31): 5 mg via ORAL
  Filled 2018-01-17 (×31): qty 1

## 2018-01-17 NOTE — Progress Notes (Signed)
PROGRESS NOTE    Lisa Bauer  SJG:[id number] DOB: Jul 31, 1955 DOA: 01/09/2018 PCP: Wenda Low, MD      Brief Narrative:  Lisa Bauer is a 62 y.o. F with no significant PMHx who presented with fever, sepsis abdominal pain, found to have liver abscess, septic shock, liver failure.    Assessment & Plan:  Septic shock from liver abscess -Continue ceftriaxone -Repeat CT 8/28   Normocytic anemia Stable -Transfusion threshold 7 g/dL  Acute renal failure Auto-diuresing, creatinine improving today.  -Daily BMP -Nephrology consult, appreciate cares -Continue Bicarb  SVT Brief.  Does not need tele monitoring. -Keep K>4, Mag>2 -Check mag  Thrombocytopenia Rquired 2 units transufion earlier. Now stable  Liver mass Biopsy negative for cancer.     DVT prophylaxis: SCDs Code Status: FULL Family Communication: Children at bedside MDM and disposition Plan: The below labs and imaging reports were reviewed and summarized above.    The patient was admitted with septic shock, from liver abscess.  On antibiotics until 8/28, now resolving renal failure.  Will require inpatient management of renal failure.  Still very flui overloaded   Consultants:   Nephrology  Procedures:     Antimicrobials:   Ceftriaxone    Subjective: Feeling well.  AMbulating.  No chest pain, dyspnea.  Leg swelling still massive.  No palpitations, confusion.  No fever, abdominal pain.  Objective: Vitals:   01/16/18 2320 01/17/18 0435 01/17/18 0540 01/17/18 0755  BP: (!) 149/77 (!) 150/78  (!) 145/76  Pulse: 77 70  67  Resp: (!) 21 14  15   Temp: 98.7 F (37.1 C) 98.3 F (36.8 C)  98 F (36.7 C)  TempSrc: Oral Oral  Oral  SpO2: 98% 97%  95%  Weight:   98.2 kg   Height:        Intake/Output Summary (Last 24 hours) at 01/17/2018 1101 Last data filed at 01/17/2018 0948 Gross per 24 hour  Intake 375 ml  Output 1265 ml  Net -890 ml   Filed Weights   01/14/18 0430 01/16/18 0320  01/17/18 0540  Weight: 103.1 kg 99.7 kg 98.2 kg    Examination: General appearance:  adult female, alert and in no acute distress.   HEENT: Anicteric, conjunctiva pink, lids and lashes normal. No nasal deformity, discharge, epistaxis.  Lips moist.   Skin: Warm and dry.   No suspicious rashes or lesions. Cardiac: RRR, nl S1-S2, no murmurs appreciated.  Capillary refill is brisk.  JVP not visible.  Large LE edema.  Radia pulses 2+ and symmetric. Respiratory: Normal respiratory rate and rhythm.  CTAB without rales or wheezes. Abdomen: Abdomen soft.  no TTP. No ascites, distension, hepatosplenomegaly.   MSK: No deformities or effusions. Neuro: Awake and alert.  EOMI, moves all extremities. Speech fluent.    Psych: Sensorium intact and responding to questions, attention normal. Affect normal.  Judgment and insight appear normal.    Data Reviewed: I have personally reviewed following labs and imaging studies:  CBC: Recent Labs  Lab 01/13/18 0400 01/14/18 0542 01/15/18 0700 01/16/18 0345 01/17/18 0500  WBC 30.4* 37.1* 39.0* 37.7* 35.6*  NEUTROABS 27.4*  --   --   --   --   HGB 7.5* 8.0* 7.8* 7.6* 7.3*  HCT 21.2* 22.4* 21.7* 20.7* 20.3*  MCV 83.1 82.7 81.9 80.9 80.9  PLT 39* 58* 80* 112* 654*   Basic Metabolic Panel: Recent Labs  Lab 01/11/18 0356  01/13/18 0400 01/14/18 0350 01/15/18 0500 01/16/18 0345 01/17/18 0500  NA 133*   < >  135 132* 133* 133* 135  K 3.4*   < > 3.2* 3.7 3.5 3.4* 3.2*  CL 101   < > 107 103 101 100 100  CO2 17*   < > 16* 16* 18* 20* 22  GLUCOSE 110*   < > 102* 122* 122* 131* 138*  BUN 63*   < > 73* 87* 91* 93* 86*  CREATININE 3.72*   < > 4.38* 5.18* 5.42* 4.99* 4.24*  CALCIUM 6.7*   < > 6.5* 6.8* 7.3* 7.3* 7.2*  MG 2.4  --   --   --   --   --   --   PHOS 3.8  --   --   --  5.7* 6.1* 5.5*   < > = values in this interval not displayed.   GFR: Estimated Creatinine Clearance: 18 mL/min (A) (by C-G formula based on SCr of 4.24 mg/dL (H)). Liver  Function Tests: Recent Labs  Lab 01/11/18 2144 01/12/18 0258 01/13/18 0400 01/14/18 0350 01/15/18 0500 01/16/18 0345 01/17/18 0500  AST 669* 1,080* 554* 260* 150*  --   --   ALT 487* 659* 549* 466* 335*  --   --   ALKPHOS 106 114 148* 193* 259*  --   --   BILITOT 3.1* 3.2* 2.0* 1.9* 1.5*  --   --   PROT 4.7* 4.7* 4.6* 5.2* 5.5*  --   --   ALBUMIN 2.1* 2.1* 1.8* 1.8* 1.8* 1.8* 1.8*   No results for input(s): LIPASE, AMYLASE in the last 168 hours. No results for input(s): AMMONIA in the last 168 hours. Coagulation Profile: No results for input(s): INR, PROTIME in the last 168 hours. Cardiac Enzymes: No results for input(s): CKTOTAL, CKMB, CKMBINDEX, TROPONINI in the last 168 hours. BNP (last 3 results) No results for input(s): PROBNP in the last 8760 hours. HbA1C: No results for input(s): HGBA1C in the last 72 hours. CBG: Recent Labs  Lab 01/10/18 1624  GLUCAP 125*   Lipid Profile: No results for input(s): CHOL, HDL, LDLCALC, TRIG, CHOLHDL, LDLDIRECT in the last 72 hours. Thyroid Function Tests: No results for input(s): TSH, T4TOTAL, FREET4, T3FREE, THYROIDAB in the last 72 hours. Anemia Panel: No results for input(s): VITAMINB12, FOLATE, FERRITIN, TIBC, IRON, RETICCTPCT in the last 72 hours. Urine analysis:    Component Value Date/Time   COLORURINE YELLOW 01/14/2018 1300   APPEARANCEUR HAZY (A) 01/14/2018 1300   LABSPEC 1.010 01/14/2018 1300   PHURINE 5.0 01/14/2018 1300   GLUCOSEU NEGATIVE 01/14/2018 1300   HGBUR SMALL (A) 01/14/2018 1300   BILIRUBINUR NEGATIVE 01/14/2018 1300   KETONESUR NEGATIVE 01/14/2018 1300   PROTEINUR NEGATIVE 01/14/2018 1300   NITRITE NEGATIVE 01/14/2018 1300   LEUKOCYTESUR NEGATIVE 01/14/2018 1300   Sepsis Labs: @LABRCNTIP (procalcitonin:4,lacticacidven:4)  ) Recent Results (from the past 240 hour(s))  Blood Culture (routine x 2)     Status: Abnormal   Collection Time: 01/09/18 11:40 AM  Result Value Ref Range Status   Specimen  Description BLOOD RIGHT ANTECUBITAL  Final   Special Requests   Final    BOTTLES DRAWN AEROBIC AND ANAEROBIC Blood Culture adequate volume   Culture  Setup Time   Final    GRAM POSITIVE COCCI IN CHAINS IN BOTH AEROBIC AND ANAEROBIC BOTTLES CRITICAL RESULT CALLED TO, READ BACK BY AND VERIFIED WITH: Laurice Record PharmD 16:10 01/10/18 ([email or web address])    Culture (A)  Final    VIRIDANS STREPTOCOCCUS SUSCEPTIBILITIES PERFORMED ON PREVIOUS CULTURE WITHIN THE LAST 5 DAYS. Performed at [facility], [address] [place], [place] [address]    Report Status 01/13/2018 FINAL  Final  Blood Culture ID Panel (Reflexed)     Status: Abnormal   Collection Time: 01/09/18 11:40 AM  Result Value Ref Range Status   Enterococcus species NOT DETECTED NOT DETECTED Final   Listeria monocytogenes NOT DETECTED NOT DETECTED Final   Staphylococcus species NOT DETECTED NOT DETECTED Final   Staphylococcus aureus NOT DETECTED NOT DETECTED Final   Streptococcus species DETECTED (A) NOT DETECTED Final    Comment: Not Enterococcus species, Streptococcus agalactiae, Streptococcus pyogenes, or Streptococcus pneumoniae. CRITICAL RESULT CALLED TO, READ BACK BY AND VERIFIED WITH: Laurice Record PharmD 16:10 01/10/18 ([email or web address])    Streptococcus agalactiae NOT DETECTED NOT DETECTED Final   Streptococcus pneumoniae NOT DETECTED NOT DETECTED Final   Streptococcus pyogenes NOT DETECTED NOT DETECTED Final   Acinetobacter baumannii NOT DETECTED NOT DETECTED Final   Enterobacteriaceae species NOT DETECTED NOT DETECTED Final   Enterobacter cloacae complex NOT DETECTED NOT DETECTED Final   Escherichia coli NOT DETECTED NOT DETECTED Final   Klebsiella oxytoca NOT DETECTED NOT DETECTED Final   Klebsiella pneumoniae NOT DETECTED NOT DETECTED Final   Proteus species NOT DETECTED NOT DETECTED Final   Serratia marcescens NOT DETECTED NOT DETECTED Final   Haemophilus influenzae NOT DETECTED NOT DETECTED Final   Neisseria meningitidis NOT  DETECTED NOT DETECTED Final   Pseudomonas aeruginosa NOT DETECTED NOT DETECTED Final   Candida albicans NOT DETECTED NOT DETECTED Final   Candida glabrata NOT DETECTED NOT DETECTED Final   Candida krusei NOT DETECTED NOT DETECTED Final   Candida parapsilosis NOT DETECTED NOT DETECTED Final   Candida tropicalis NOT DETECTED NOT DETECTED Final    Comment: Performed at [facility], [place] [address], [place], [place] [address]  Blood Culture (routine x 2)     Status: Abnormal   Collection Time: 01/09/18 11:50 AM  Result Value Ref Range Status   Specimen Description BLOOD LEFT ANTECUBITAL  Final   Special Requests   Final    BOTTLES DRAWN AEROBIC AND ANAEROBIC Blood Culture adequate volume   Culture  Setup Time   Final    GRAM POSITIVE COCCI IN CHAINS ANAEROBIC BOTTLE ONLY CRITICAL VALUE NOTED.  VALUE IS CONSISTENT WITH PREVIOUSLY REPORTED AND CALLED VALUE. Performed at [facility], [place] [address], [place], [place] [address]    Culture VIRIDANS STREPTOCOCCUS (A)  Final   Report Status 01/13/2018 FINAL  Final   Organism ID, Bacteria VIRIDANS STREPTOCOCCUS  Final      Susceptibility   Viridans streptococcus - MIC*    PENICILLIN <=0.06 SENSITIVE Sensitive     ERYTHROMYCIN <=0.12 SENSITIVE Sensitive     LEVOFLOXACIN 0.5 SENSITIVE Sensitive     VANCOMYCIN 0.5 SENSITIVE Sensitive     * VIRIDANS STREPTOCOCCUS  MRSA PCR Screening     Status: None   Collection Time: 01/09/18  4:31 PM  Result Value Ref Range Status   MRSA by PCR NEGATIVE NEGATIVE Final    Comment:        The GeneXpert MRSA Assay (FDA approved for NASAL specimens only), is one component of a comprehensive MRSA colonization surveillance program. It is not intended to diagnose MRSA infection nor to guide or monitor treatment for MRSA infections. Performed at [facility], [place] [address] [place], [place] [address]   Culture, blood (routine x 2)     Status: None (Preliminary result)   Collection Time:  01/11/18 12:18 AM  Result Value Ref Range Status   Specimen Description BLOOD RIGHT PICC LINE  Final   Special Requests   Final    BOTTLES DRAWN AEROBIC AND ANAEROBIC Blood Culture adequate volume   Culture   Final    NO GROWTH 4 DAYS Performed at [facility], [address], [place], [place] [address]    Report Status PENDING  Incomplete  Aerobic/Anaerobic Culture (surgical/deep wound)     Status: None   Collection Time: 01/11/18  2:27 PM  Result Value Ref Range Status   Specimen Description ABSCESS  Final   Special Requests Normal  Final   Gram Stain   Final    MODERATE WBC PRESENT, PREDOMINANTLY PMN MODERATE GRAM POSITIVE COCCI    Culture   Final    FEW STREPTOCOCCUS GROUP F NO ANAEROBES ISOLATED Performed at [facility], [place] [address], [place], [place] [address]    Report Status 01/16/2018 FINAL  Final  Culture, blood (routine x 2)     Status: None   Collection Time: 01/11/18  9:29 PM  Result Value Ref Range Status   Specimen Description BLOOD BLOOD LEFT FOREARM  Final   Special Requests   Final    BOTTLES DRAWN AEROBIC ONLY Blood Culture results may not be optimal due to an inadequate volume of blood received in culture bottles   Culture   Final    NO GROWTH 5 DAYS Performed at [facility], [place] [address], [place], [place] [address]    Report Status 01/16/2018 FINAL  Final         Radiology Studies: No results found.      Scheduled Meds: . sodium bicarbonate  1,300 mg Oral TID  . sodium chloride flush  5 mL Intracatheter Q8H   Continuous Infusions: . sodium chloride Stopped (01/12/18 0453)  . cefTRIAXone (ROCEPHIN)  IV 2 g (01/16/18 1310)     LOS: 8 days    Time spent: 25 minutes    Edwin Dada, MD [facility] 01/17/2018, 11:01 AM     Pager [phone] --- please page though AMION:  [email or web address] Password [email or web address] If 7PM-7AM, please contact night-coverage

## 2018-01-17 NOTE — Progress Notes (Signed)
Referring Physician(s): Alva,R  Supervising Physician: Aletta Edouard  Patient Status:  [facility] - In-pt  Chief Complaint:  Hepatic mass/abscess S/P drain by [place] 01/11/2018  Subjective:  Patient is doing well today. Sitting up in chair. Son in the room as well. No complaints. She did have lots of questions regarding the drain and how long it would stay in place.  Allergies: Patient has no known allergies.  Medications: Prior to Admission medications   Medication Sig Start Date End Date Taking? Authorizing Provider  cholecalciferol (VITAMIN D) 1000 units tablet Take 1,000 Units by mouth daily.   Yes [provider]  lisinopril-hydrochlorothiazide (PRINZIDE,ZESTORETIC) 20-25 MG tablet Take 1 tablet by mouth at bedtime.  07/14/16  Yes [provider]  ondansetron (ZOFRAN-ODT) 8 MG disintegrating tablet Take 8 mg by mouth every 8 (eight) hours as needed for nausea or vomiting.   Yes [provider]  phenazopyridine (PYRIDIUM) 200 MG tablet Take 200 mg by mouth 3 (three) times daily as needed for pain.   Yes [provider]  TURMERIC PO Take 2 capsules by mouth at bedtime.   Yes [provider]     Vital Signs: BP (!) 145/76 (BP Location: Left Arm)   Pulse 67   Temp 98 F (36.7 C) (Oral)   Resp 15   Ht 6' (1.829 m)   Wt 98.2 kg   LMP  (LMP Unknown)   SpO2 95%   BMI 29.38 kg/m   Physical Exam Awake and alert NAD Abdomen soft Drain in place ~40 mL output. Drainage is red serous, no purulence or sediment seen.  Imaging: No results found.  Labs:  CBC: Recent Labs    01/14/18 0542 01/15/18 0700 01/16/18 0345 01/17/18 0500  WBC 37.1* 39.0* 37.7* 35.6*  HGB 8.0* 7.8* 7.6* 7.3*  HCT 22.4* 21.7* 20.7* 20.3*  PLT 58* 80* 112* 146*    COAGS: Recent Labs    01/09/18 1151  INR 1.24    BMP: Recent Labs    01/14/18 0350 01/15/18 0500 01/16/18 0345 01/17/18 0500  NA 132* 133* 133* 135  K 3.7 3.5 3.4* 3.2*    CL 103 101 100 100  CO2 16* 18* 20* 22  GLUCOSE 122* 122* 131* 138*  BUN 87* 91* 93* 86*  CALCIUM 6.8* 7.3* 7.3* 7.2*  CREATININE 5.18* 5.42* 4.99* 4.24*  GFRNONAA 8* 8* 8* 10*  GFRAA 9* 9* 10* 12*    LIVER FUNCTION TESTS: Recent Labs    01/12/18 0258 01/13/18 0400 01/14/18 0350 01/15/18 0500 01/16/18 0345 01/17/18 0500  BILITOT 3.2* 2.0* 1.9* 1.5*  --   --   AST 1,080* 554* 260* 150*  --   --   ALT 659* 549* 466* 335*  --   --   ALKPHOS 114 148* 193* 259*  --   --   PROT 4.7* 4.6* 5.2* 5.5*  --   --   ALBUMIN 2.1* 1.8* 1.8* 1.8* 1.8* 1.8*    Assessment and Plan:  Hepatic mass/abscess - pathology showed no malignancy.  S/P drain by [place] 01/11/2018  Afebrile, WBC 35.6  Dr. Johnnye Sima recommends 3 weeks of IV antibiotics and repeat CT at that time.  I will arrange outpatient CT at our clinic in 3 weeks.  Our office will call patient with appointment date and time.  Continue routine drain care with saline flushes, about 3- 5 mL 2 to 3 times daily and record output.    Electronically Signed: Murrell Redden, PA-C 01/17/2018, 9:53  AM    I spent a total of 15 Minutes at the the patient's bedside AND on the patient's hospital floor or unit, greater than 50% of which was counseling/coordinating care for f/u hepatic abscess.

## 2018-01-17 NOTE — Evaluation (Signed)
Physical Therapy Evaluation Patient Details Name: Lisa Bauer MRN: [id number] DOB: Apr 15, 1956 Today's Date: 01/17/2018   History of Present Illness  62 year old lady with prior h/o  comes in for fever, abdominal pain, was found to have hypotension and elevated lactic acid. Further evaluation with a CT abdomen showed heterogenous liver mass. She was initially admitted by [facility] for septic shock and briefly required pressors. Hepatic drain placed 01/11/18.  Clinical Impression  Pt ambulated 300' with RW, no loss of balance, distance limited by pain. Encouraged pt to ambulate in halls 2-3x/day on her own. She is independent with walking with RW, no further acute PT indicated. Will sign off.     Follow Up Recommendations No PT follow up    Equipment Recommendations  Rolling walker with 5" wheels    Recommendations for Other Services       Precautions / Restrictions Precautions Precautions: Other (comment) Precaution Comments: hepatic drain Restrictions Weight Bearing Restrictions: No      Mobility  Bed Mobility               General bed mobility comments: up on edge of bed. Verbally instructed pt in log roll technique.   Transfers Overall transfer level: Modified independent Equipment used: Rolling walker (2 wheeled)                Ambulation/Gait Ambulation/Gait assistance: Modified independent (Device/Increase time) Gait Distance (Feet): 300 Feet Assistive device: Rolling walker (2 wheeled) Gait Pattern/deviations: Step-through pattern;Decreased stride length Gait velocity: decr   General Gait Details: steady with no loss of balance  Stairs            Wheelchair Mobility    Modified Rankin (Stroke Patients Only)       Balance Overall balance assessment: Modified Independent                                           Pertinent Vitals/Pain Pain Assessment: 0-10 Pain Score: 8  Pain Location: abdomen with walking Pain  Descriptors / Indicators: Sore Pain Intervention(s): Limited activity within patient's tolerance;Monitored during session;Patient requesting pain meds-RN notified    Home Living Family/patient expects to be discharged to:: Private residence Living Arrangements: Spouse/significant other Available Help at Discharge: Family;Available 24 hours/day Type of Home: House       Home Layout: Two level;Able to live on main level with bedroom/bathroom Home Equipment: None      Prior Function Level of Independence: Independent         Comments: ambulated without AD, did Pilates and water aerobics, leads Bible study     Hand Dominance        Extremity/Trunk Assessment   Upper Extremity Assessment Upper Extremity Assessment: Overall WFL for tasks assessed    Lower Extremity Assessment Lower Extremity Assessment: Overall WFL for tasks assessed    Cervical / Trunk Assessment Cervical / Trunk Assessment: Normal  Communication   Communication: No difficulties  Cognition Arousal/Alertness: Awake/alert Behavior During Therapy: WFL for tasks assessed/performed Overall Cognitive Status: Within Functional Limits for tasks assessed                                        General Comments      Exercises     Assessment/Plan    PT Assessment Patent  does not need any further PT services  PT Problem List         PT Treatment Interventions      PT Goals (Current goals can be found in the Care Plan section)  Acute Rehab PT Goals Patient Stated Goal: return to prior activities (water aerobics, pilates, bible study) PT Goal Formulation: All assessment and education complete, DC therapy    Frequency     Barriers to discharge        Co-evaluation               AM-PAC PT "6 Clicks" Daily Activity  Outcome Measure Difficulty turning over in bed (including adjusting bedclothes, sheets and blankets)?: A Little Difficulty moving from lying on back to sitting  on the side of the bed? : A Little Difficulty sitting down on and standing up from a chair with arms (e.g., wheelchair, bedside commode, etc,.)?: None Help needed moving to and from a bed to chair (including a wheelchair)?: None Help needed walking in hospital room?: None Help needed climbing 3-5 steps with a railing? : None 6 Click Score: 22    End of Session   Activity Tolerance: Patient tolerated treatment well Patient left: in chair;with call bell/phone within reach;with family/visitor present Nurse Communication: Mobility status      Time: 1140-1207 PT Time Calculation (min) (ACUTE ONLY): 27 min   Charges:   PT Evaluation $PT Eval Low Complexity: 1 Low PT Treatments $Gait Training: 8-22 mins          Blondell Reveal Kistler 01/17/2018, 12:51 PM [phone]

## 2018-01-17 NOTE — Progress Notes (Signed)
Subjective: Interval History: has no complaint .  Objective: Vital signs in last 24 hours: Temp:  [98 F (36.7 C)-98.7 F (37.1 C)] 98 F (36.7 C) (08/15 0755) Pulse Rate:  [67-78] 67 (08/15 0755) Resp:  [14-21] 15 (08/15 0755) BP: (137-151)/(69-79) 145/76 (08/15 0755) SpO2:  [95 %-98 %] 95 % (08/15 0755) Weight:  [98.2 kg] 98.2 kg (08/15 0540) Weight change: -1.452 kg  Intake/Output from previous day: 08/14 0701 - 08/15 0700 In: 250 [P.O.:240] Out: 1765 [Urine:1725; Drains:40] Intake/Output this shift: No intake/output data recorded.  General appearance: alert, cooperative, no distress and pale Resp: diminished breath sounds bibasilar Cardio: S1, S2 normal and systolic murmur: systolic ejection 2/6, decrescendo at 2nd left intercostal space GI: drain RUQ , pos bs Extremities: edema 3-4+  Lab Results: Recent Labs    01/16/18 0345 01/17/18 0500  WBC 37.7* 35.6*  HGB 7.6* 7.3*  HCT 20.7* 20.3*  PLT 112* 146*   BMET:  Recent Labs    01/16/18 0345 01/17/18 0500  NA 133* 135  K 3.4* 3.2*  CL 100 100  CO2 20* 22  GLUCOSE 131* 138*  BUN 93* 86*  CREATININE 4.99* 4.24*  CALCIUM 7.3* 7.2*   No results for input(s): PTH in the last 72 hours. Iron Studies: No results for input(s): IRON, TIBC, TRANSFERRIN, FERRITIN in the last 72 hours.  Studies/Results: No results found.  I have reviewed the patient's current medications.  Assessment/Plan: 1 AKI schock, improving, diuresing on her own. K low replete.  Still 20-30 lb xs vol. 2 Liver abscess on iv AB 3 Debill ?? Rehab 4 Anemia Repeat Fe P Fe, cont AB, allow to diuese, remove foley   LOS: 8 days   Jeneen Rinks Melanye Hiraldo 01/17/2018,8:22 AM

## 2018-01-18 DIAGNOSIS — D649 Anemia, unspecified: Secondary | ICD-10-CM

## 2018-01-18 LAB — CULTURE, BLOOD (ROUTINE X 2)
CULTURE: NO GROWTH
SPECIAL REQUESTS: ADEQUATE

## 2018-01-18 LAB — RENAL FUNCTION PANEL
ALBUMIN: 1.9 g/dL — AB (ref 3.5–5.0)
Anion gap: 10 (ref 5–15)
BUN: 69 mg/dL — AB (ref 8–23)
CALCIUM: 7.3 mg/dL — AB (ref 8.9–10.3)
CO2: 25 mmol/L (ref 22–32)
CREATININE: 3.26 mg/dL — AB (ref 0.44–1.00)
Chloride: 104 mmol/L (ref 98–111)
GFR, EST AFRICAN AMERICAN: 16 mL/min — AB (ref >60)
GFR, EST NON AFRICAN AMERICAN: 14 mL/min — AB (ref >60)
Glucose, Bld: 120 mg/dL — ABNORMAL HIGH (ref 70–99)
PHOSPHORUS: 4.4 mg/dL (ref 2.5–4.6)
Potassium: 3.2 mmol/L — ABNORMAL LOW (ref 3.5–5.1)
SODIUM: 139 mmol/L (ref 135–145)

## 2018-01-18 LAB — CBC
HCT: 16.3 % — ABNORMAL LOW (ref 36.0–46.0)
HCT: 30.5 % — ABNORMAL LOW (ref 36.0–46.0)
HEMOGLOBIN: 5.7 g/dL — AB (ref 12.0–15.0)
Hemoglobin: 11 g/dL — ABNORMAL LOW (ref 12.0–15.0)
MCH: 29.1 pg (ref 26.0–34.0)
MCH: 29.2 pg (ref 26.0–34.0)
MCHC: 35 g/dL (ref 30.0–36.0)
MCHC: 36.1 g/dL — ABNORMAL HIGH (ref 30.0–36.0)
MCV: 83.2 fL (ref 78.0–100.0)
MCV: 80.9 fL (ref 78.0–100.0)
PLATELETS: 160 10*3/uL (ref 150–400)
PLATELETS: 158 10*3/uL (ref 150–400)
RBC: 1.96 MIL/uL — ABNORMAL LOW (ref 3.87–5.11)
RBC: 3.77 MIL/uL — AB (ref 3.87–5.11)
RDW: 14.6 % (ref 11.5–15.5)
RDW: 14 % (ref 11.5–15.5)
WBC: 26 10*3/uL — ABNORMAL HIGH (ref 4.0–10.5)
WBC: 19.6 10*3/uL — AB (ref 4.0–10.5)

## 2018-01-18 MED ORDER — SODIUM CHLORIDE 0.9% IV SOLUTION
Freq: Once | INTRAVENOUS | Status: DC
Start: 2018-01-18 — End: 2018-01-18

## 2018-01-18 MED ORDER — SODIUM CHLORIDE 0.9 % IV SOLN
510.0000 mg | Freq: Once | INTRAVENOUS | Status: AC
  Administered 2018-01-18: 510 mg via INTRAVENOUS
  Filled 2018-01-18: qty 17

## 2018-01-18 MED ORDER — POTASSIUM CHLORIDE CRYS ER 20 MEQ PO TBCR
40.0000 meq | EXTENDED_RELEASE_TABLET | Freq: Once | ORAL | Status: AC
  Administered 2018-01-18: 40 meq via ORAL
  Filled 2018-01-18: qty 2

## 2018-01-18 NOTE — Progress Notes (Signed)
Subjective: Interval History: has no complaint .  Objective: Vital signs in last 24 hours: Temp:  [98.1 F (36.7 C)-98.7 F (37.1 C)] 98.7 F (37.1 C) (08/16 0626) Pulse Rate:  [68-72] 72 (08/16 0626) Resp:  [15] 15 (08/16 0626) BP: (148-154)/(74-77) 154/77 (08/16 0626) SpO2:  [96 %-99 %] 99 % (08/16 0626) Weight:  [97.1 kg] 97.1 kg (08/16 0626) Weight change: -1.134 kg  Intake/Output from previous day: 08/15 0701 - 08/16 0700 In: 145 [P.O.:125] Out: 2115 [Urine:2100; Drains:15] Intake/Output this shift: No intake/output data recorded.  General appearance: alert, cooperative, no distress and mildly obese Resp: diminished breath sounds bibasilar Cardio: S1, S2 normal and systolic murmur: systolic ejection 2/6, decrescendo at 2nd left intercostal space GI: drain RUQ, pos bs, soft, mod distension Extremities: edema 3+  Lab Results: Recent Labs    01/17/18 0500 01/18/18 0629  WBC 35.6* 26.0*  HGB 7.3* 5.7*  HCT 20.3* 16.3*  PLT 146* 160   BMET:  Recent Labs    01/16/18 0345 01/17/18 0500  NA 133* 135  K 3.4* 3.2*  CL 100 100  CO2 20* 22  GLUCOSE 131* 138*  BUN 93* 86*  CREATININE 4.99* 4.24*  CALCIUM 7.3* 7.2*   No results for input(s): PTH in the last 72 hours. Iron Studies:  Recent Labs    01/17/18 0826  IRON 30  TIBC 214*    Studies/Results: No results found.  I have reviewed the patient's current medications.  Assessment/Plan: 1 AKI chem pending.  ?diuresing on her own.  If improving can go home anytime from our standpoint and we will follow 2 Liver abscess on AB  3 Anemia ??drop serious issue 4 Debill P AB, check chem , diet,     LOS: 9 days   Rameses Ou 01/18/2018,9:05 AM

## 2018-01-18 NOTE — Progress Notes (Addendum)
PROGRESS NOTE    LORIANNE MALBROUGH  XAJ:[id number] DOB: July 31, 1955 DOA: 01/09/2018 PCP: Wenda Low, MD      Brief Narrative:  Mrs. Tomlinson is a 62 y.o. F with no significant PMHx (maybe remote history of HTN, not currently on medicine) who presented with fever, sepsis abdominal pain, found to have liver abscess, septic shock, liver failure.  Admitted to ICU    Assessment & Plan:  Septic shock from liver abscess Blood cultures growing viridans strep alpha hemoly, abscess aspirated 8/9, culture of abscess growing group F strep.   Echo without signs of vegetation, repeat cultures negative. Infectious disease consulted.   Treated initially with broad spectrum empiric antibiotics, narrowed to Zosyn, on 8/12 transition to ceftriaxone.   -We will plan for IV ceftriaxone until 8/28 then repeat CT "to determine if she needs more IV or can change to p.o."  -Continue ceftriaxone -Repeat CT 8/28 -Needs outpatient update of colonoscopy for GDS bacteremia  Normocytic anemia Baseline hemoglobin 12 on admission.  Bone marrow suppression from infection, renal injury, and phlebotomy.  No evidence at all of clinical bleeding.  Hgb this morning 5.7 g/dL, but lab thinks may be error.  -Transfusion threshold 7 g/dL -Check FOBT -Repeat CBC, if Hgb less than 7, will transfuse 1 and repeat H/H, would like to minimize volume givne ARF  Acute renal failure Creatinine pending today.  Made 1.9L net out yesterday with auto-diuresis. -Daily BMP -Nephrology consult, appreciate cares -Continue Bicarb per nephrology  SVT Brief.  Does not need tele monitoring.  Mag normal. -Keep K>4, Mag>2  Thrombocytopenia Nadir 39K, has recovered well. Required 2 units transufion earlier.  -Trend CBC  Liver mass Biopsy negative for cancer.     DVT prophylaxis: SCDs Code Status: FULL Family Communication: Husband at bedside MDM and disposition Plan: Below labs and imaging reports were reviewed and summarized  above.  Medication management as above.  The patient was admitted with septic shock, from liver abscess.  Aspirated on 8/9, now with resuming renal failure.  Improved thrombus cytopenia, multiorgan failure resolving.  Patient now has creatinine in the fives, which slowly improving, but she is still significantly fluid overloaded.  Due to her significant fluid overload and the possibility of electrolyte abnormalities with auto diuresis, she will need inpatient management until her fluid status is more close to normal, and her creatinine can be followed as an outpatient.       Consultants:   Nephrology  Procedures:     Antimicrobials:   Ceftriaxone   Culture data:   8/7 admission Blood cultures x2: 2/2 viridans strep  8/9 blood culture repeat x1: NGTD  8/9 abscess aspirate: Group F strep       Subjective: Feeling well, no chest pain, dyspnea, melena, hematochezia, vaginal bleeding, dysuria, hematuria, vomiting, palpitations, fever, abdominal pain.   No orthostasis, weakness or dyspnea with ambulation.  Objective: Vitals:   01/17/18 0755 01/17/18 1230 01/17/18 2122 01/18/18 0626  BP: (!) 145/76 (!) 151/77 (!) 148/74 (!) 154/77  Pulse: 67  68 72  Resp: 15   15  Temp: 98 F (36.7 C) 98.1 F (36.7 C) 98.2 F (36.8 C) 98.7 F (37.1 C)  TempSrc: Oral Oral Oral Oral  SpO2: 95%  96% 99%  Weight:    97.1 kg  Height:        Intake/Output Summary (Last 24 hours) at 01/18/2018 0803 Last data filed at 01/18/2018 0947 Gross per 24 hour  Intake 145 ml  Output 2115 ml  Net -  1970 ml   Filed Weights   01/16/18 0320 01/17/18 0540 01/18/18 0626  Weight: 99.7 kg 98.2 kg 97.1 kg    Examination: General appearance: Well female, sitting in recliner, no acute distress, interactive HEENT: Anicteric, conjunctival pink, lids and lashes normal.  No nasal deformity, discharge, or epistaxis.  Lips moist, teeth normal, oropharynx moist, no oral lesions, hearing normal. Skin: Warm  and dry, no suspicious rashes or lesions, may be subtle pallor. Cardiac: Regular rate and rhythm, may be mild tachycardia, no murmur noted, JVP not visible, still 2+ pitting edema above the knees. Respiratory: Normal respiratory rate and rhythm, lungs clear without rales or wheezes. Abdomen: Abdomen soft without tenderness to palpation, no ascites, distention.   MSK: No deformities or effusions of the large joints of the upper lower extremities bilaterally. Neuro: Awake and alert, extraocular movements intact, moves all extremities with normal strength symmetric coordination, speech fluent.    Psych: Elder Love him intact and responding to questions, attention normal, affect normal, judgment insight normal.    Data Reviewed: I have personally reviewed following labs and imaging studies:  CBC: Recent Labs  Lab 01/13/18 0400 01/14/18 0542 01/15/18 0700 01/16/18 0345 01/17/18 0500 01/18/18 0629  WBC 30.4* 37.1* 39.0* 37.7* 35.6* 26.0*  NEUTROABS 27.4*  --   --   --   --   --   HGB 7.5* 8.0* 7.8* 7.6* 7.3* 5.7*  HCT 21.2* 22.4* 21.7* 20.7* 20.3* 16.3*  MCV 83.1 82.7 81.9 80.9 80.9 83.2  PLT 39* 58* 80* 112* 146* 967   Basic Metabolic Panel: Recent Labs  Lab 01/13/18 0400 01/14/18 0350 01/15/18 0500 01/16/18 0345 01/17/18 0500 01/17/18 1216  NA 135 132* 133* 133* 135  --   K 3.2* 3.7 3.5 3.4* 3.2*  --   CL 107 103 101 100 100  --   CO2 16* 16* 18* 20* 22  --   GLUCOSE 102* 122* 122* 131* 138*  --   BUN 73* 87* 91* 93* 86*  --   CREATININE 4.38* 5.18* 5.42* 4.99* 4.24*  --   CALCIUM 6.5* 6.8* 7.3* 7.3* 7.2*  --   MG  --   --   --   --   --  2.2  PHOS  --   --  5.7* 6.1* 5.5*  --    GFR: Estimated Creatinine Clearance: 18 mL/min (A) (by C-G formula based on SCr of 4.24 mg/dL (H)). Liver Function Tests: Recent Labs  Lab 01/11/18 2144 01/12/18 0258 01/13/18 0400 01/14/18 0350 01/15/18 0500 01/16/18 0345 01/17/18 0500  AST 669* 1,080* 554* 260* 150*  --   --   ALT 487*  659* 549* 466* 335*  --   --   ALKPHOS 106 114 148* 193* 259*  --   --   BILITOT 3.1* 3.2* 2.0* 1.9* 1.5*  --   --   PROT 4.7* 4.7* 4.6* 5.2* 5.5*  --   --   ALBUMIN 2.1* 2.1* 1.8* 1.8* 1.8* 1.8* 1.8*   No results for input(s): LIPASE, AMYLASE in the last 168 hours. No results for input(s): AMMONIA in the last 168 hours. Coagulation Profile: No results for input(s): INR, PROTIME in the last 168 hours. Cardiac Enzymes: No results for input(s): CKTOTAL, CKMB, CKMBINDEX, TROPONINI in the last 168 hours. BNP (last 3 results) No results for input(s): PROBNP in the last 8760 hours. HbA1C: No results for input(s): HGBA1C in the last 72 hours. CBG: No results for input(s): GLUCAP in the last 168 hours. Lipid  Profile: No results for input(s): CHOL, HDL, LDLCALC, TRIG, CHOLHDL, LDLDIRECT in the last 72 hours. Thyroid Function Tests: No results for input(s): TSH, T4TOTAL, FREET4, T3FREE, THYROIDAB in the last 72 hours. Anemia Panel: Recent Labs    01/17/18 0826  TIBC 214*  IRON 30   Urine analysis:    Component Value Date/Time   COLORURINE YELLOW 01/14/2018 1300   APPEARANCEUR HAZY (A) 01/14/2018 1300   LABSPEC 1.010 01/14/2018 1300   PHURINE 5.0 01/14/2018 1300   GLUCOSEU NEGATIVE 01/14/2018 1300   HGBUR SMALL (A) 01/14/2018 1300   BILIRUBINUR NEGATIVE 01/14/2018 1300   KETONESUR NEGATIVE 01/14/2018 1300   PROTEINUR NEGATIVE 01/14/2018 1300   NITRITE NEGATIVE 01/14/2018 1300   LEUKOCYTESUR NEGATIVE 01/14/2018 1300   Sepsis Labs: @LABRCNTIP (procalcitonin:4,lacticacidven:4)  ) Recent Results (from the past 240 hour(s))  Blood Culture (routine x 2)     Status: Abnormal   Collection Time: 01/09/18 11:40 AM  Result Value Ref Range Status   Specimen Description BLOOD RIGHT ANTECUBITAL  Final   Special Requests   Final    BOTTLES DRAWN AEROBIC AND ANAEROBIC Blood Culture adequate volume   Culture  Setup Time   Final    GRAM POSITIVE COCCI IN CHAINS IN BOTH AEROBIC AND  ANAEROBIC BOTTLES CRITICAL RESULT CALLED TO, READ BACK BY AND VERIFIED WITH: Laurice Record PharmD 16:10 01/10/18 ([email or web address])    Culture (A)  Final    VIRIDANS STREPTOCOCCUS SUSCEPTIBILITIES PERFORMED ON PREVIOUS CULTURE WITHIN THE LAST 5 DAYS. Performed at [facility], [place] [address], [place], [place] [address]    Report Status 01/13/2018 FINAL  Final  Blood Culture ID Panel (Reflexed)     Status: Abnormal   Collection Time: 01/09/18 11:40 AM  Result Value Ref Range Status   Enterococcus species NOT DETECTED NOT DETECTED Final   Listeria monocytogenes NOT DETECTED NOT DETECTED Final   Staphylococcus species NOT DETECTED NOT DETECTED Final   Staphylococcus aureus NOT DETECTED NOT DETECTED Final   Streptococcus species DETECTED (A) NOT DETECTED Final    Comment: Not Enterococcus species, Streptococcus agalactiae, Streptococcus pyogenes, or Streptococcus pneumoniae. CRITICAL RESULT CALLED TO, READ BACK BY AND VERIFIED WITH: Laurice Record PharmD 16:10 01/10/18 ([email or web address])    Streptococcus agalactiae NOT DETECTED NOT DETECTED Final   Streptococcus pneumoniae NOT DETECTED NOT DETECTED Final   Streptococcus pyogenes NOT DETECTED NOT DETECTED Final   Acinetobacter baumannii NOT DETECTED NOT DETECTED Final   Enterobacteriaceae species NOT DETECTED NOT DETECTED Final   Enterobacter cloacae complex NOT DETECTED NOT DETECTED Final   Escherichia coli NOT DETECTED NOT DETECTED Final   Klebsiella oxytoca NOT DETECTED NOT DETECTED Final   Klebsiella pneumoniae NOT DETECTED NOT DETECTED Final   Proteus species NOT DETECTED NOT DETECTED Final   Serratia marcescens NOT DETECTED NOT DETECTED Final   Haemophilus influenzae NOT DETECTED NOT DETECTED Final   Neisseria meningitidis NOT DETECTED NOT DETECTED Final   Pseudomonas aeruginosa NOT DETECTED NOT DETECTED Final   Candida albicans NOT DETECTED NOT DETECTED Final   Candida glabrata NOT DETECTED NOT DETECTED Final   Candida krusei NOT DETECTED NOT DETECTED  Final   Candida parapsilosis NOT DETECTED NOT DETECTED Final   Candida tropicalis NOT DETECTED NOT DETECTED Final    Comment: Performed at [facility], [place] [address] [place], [place] [address]  Blood Culture (routine x 2)     Status: Abnormal   Collection Time: 01/09/18 11:50 AM  Result Value Ref Range Status   Specimen Description BLOOD LEFT ANTECUBITAL  Final   Special Requests   Final    BOTTLES DRAWN AEROBIC AND ANAEROBIC Blood Culture adequate volume   Culture  Setup Time   Final    GRAM POSITIVE COCCI IN CHAINS ANAEROBIC BOTTLE ONLY CRITICAL VALUE NOTED.  VALUE IS CONSISTENT WITH PREVIOUSLY REPORTED AND CALLED VALUE. Performed at [facility], [place] [address] [place], [place] [address]    Culture VIRIDANS STREPTOCOCCUS (A)  Final   Report Status 01/13/2018 FINAL  Final   Organism ID, Bacteria VIRIDANS STREPTOCOCCUS  Final      Susceptibility   Viridans streptococcus - MIC*    PENICILLIN <=0.06 SENSITIVE Sensitive     ERYTHROMYCIN <=0.12 SENSITIVE Sensitive     LEVOFLOXACIN 0.5 SENSITIVE Sensitive     VANCOMYCIN 0.5 SENSITIVE Sensitive     * VIRIDANS STREPTOCOCCUS  MRSA PCR Screening     Status: None   Collection Time: 01/09/18  4:31 PM  Result Value Ref Range Status   MRSA by PCR NEGATIVE NEGATIVE Final    Comment:        The GeneXpert MRSA Assay (FDA approved for NASAL specimens only), is one component of a comprehensive MRSA colonization surveillance program. It is not intended to diagnose MRSA infection nor to guide or monitor treatment for MRSA infections. Performed at [facility], [place] [address] [place], [place] [address]   Culture, blood (routine x 2)     Status: None (Preliminary result)   Collection Time: 01/11/18 12:18 AM  Result Value Ref Range Status   Specimen Description BLOOD RIGHT PICC LINE  Final   Special Requests   Final    BOTTLES DRAWN AEROBIC AND ANAEROBIC Blood Culture adequate volume   Culture   Final    NO GROWTH 4  DAYS Performed at [facility], [place] [address] [place], [place] [address]    Report Status PENDING  Incomplete  Aerobic/Anaerobic Culture (surgical/deep wound)     Status: None   Collection Time: 01/11/18  2:27 PM  Result Value Ref Range Status   Specimen Description ABSCESS  Final   Special Requests Normal  Final   Gram Stain   Final    MODERATE WBC PRESENT, PREDOMINANTLY PMN MODERATE GRAM POSITIVE COCCI    Culture   Final    FEW STREPTOCOCCUS GROUP F NO ANAEROBES ISOLATED Performed at [facility], [place] [address], [place], [place] [address]    Report Status 01/16/2018 FINAL  Final  Culture, blood (routine x 2)     Status: None   Collection Time: 01/11/18  9:29 PM  Result Value Ref Range Status   Specimen Description BLOOD BLOOD LEFT FOREARM  Final   Special Requests   Final    BOTTLES DRAWN AEROBIC ONLY Blood Culture results may not be optimal due to an inadequate volume of blood received in culture bottles   Culture   Final    NO GROWTH 5 DAYS Performed at [facility], [place] [address] [place],  [address]    Report Status 01/16/2018 FINAL  Final         Radiology Studies: No results found.      Scheduled Meds: . sodium bicarbonate  1,300 mg Oral TID  . sodium chloride flush  5 mL Intracatheter Q8H   Continuous Infusions: . cefTRIAXone (ROCEPHIN)  IV Stopped (01/17/18 1132)     LOS: 9 days    Time spent: 25 minutes    Edwin Dada, MD [facility]  01/18/2018, 8:03 AM     Pager [phone] --- please page though AMION:  [email or web address] Password [email or web address] If 7PM-7AM, please contact night-coverage

## 2018-01-19 LAB — RENAL FUNCTION PANEL
ALBUMIN: 1.9 g/dL — AB (ref 3.5–5.0)
ANION GAP: 10 (ref 5–15)
BUN: 51 mg/dL — ABNORMAL HIGH (ref 8–23)
CO2: 27 mmol/L (ref 22–32)
Calcium: 7.5 mg/dL — ABNORMAL LOW (ref 8.9–10.3)
Chloride: 104 mmol/L (ref 98–111)
Creatinine, Ser: 2.37 mg/dL — ABNORMAL HIGH (ref 0.44–1.00)
GFR calc Af Amer: 24 mL/min — ABNORMAL LOW (ref >60)
GFR calc non Af Amer: 21 mL/min — ABNORMAL LOW (ref >60)
GLUCOSE: 107 mg/dL — AB (ref 70–99)
PHOSPHORUS: 3.7 mg/dL (ref 2.5–4.6)
Potassium: 3.5 mmol/L (ref 3.5–5.1)
Sodium: 141 mmol/L (ref 135–145)

## 2018-01-19 LAB — CBC
HEMATOCRIT: 21.1 % — AB (ref 36.0–46.0)
HEMOGLOBIN: 7.2 g/dL — AB (ref 12.0–15.0)
MCH: 29 pg (ref 26.0–34.0)
MCHC: 34.1 g/dL (ref 30.0–36.0)
MCV: 85.1 fL (ref 78.0–100.0)
Platelets: 287 10*3/uL (ref 150–400)
RBC: 2.48 MIL/uL — ABNORMAL LOW (ref 3.87–5.11)
RDW: 14.9 % (ref 11.5–15.5)
WBC: 27.1 10*3/uL — ABNORMAL HIGH (ref 4.0–10.5)

## 2018-01-19 MED ORDER — CEFTRIAXONE IV (FOR PTA / DISCHARGE USE ONLY): 2.0000 g | INTRAVENOUS | 0 refills | Status: DC

## 2018-01-19 MED ORDER — POTASSIUM CHLORIDE CRYS ER 20 MEQ PO TBCR
40.0000 meq | EXTENDED_RELEASE_TABLET | Freq: Once | ORAL | Status: AC
  Administered 2018-01-19: 40 meq via ORAL
  Filled 2018-01-19: qty 2

## 2018-01-19 MED ORDER — POTASSIUM CHLORIDE CRYS ER 10 MEQ PO TBCR
10.0000 meq | EXTENDED_RELEASE_TABLET | Freq: Every day | ORAL | Status: DC
  Administered 2018-01-20 – 2018-01-24 (×5): 10 meq via ORAL
  Filled 2018-01-19 (×5): qty 1

## 2018-01-19 NOTE — Progress Notes (Signed)
Subjective: Interval History: has complaints twinges of pain, upper abdm,. Lower chest with movement.  Objective: Vital signs in last 24 hours: Temp:  [97.6 F (36.4 C)-98.5 F (36.9 C)] 98.5 F (36.9 C) (08/17 0640) Pulse Rate:  [72-75] 75 (08/17 0640) Resp:  [12] 12 (08/16 2147) BP: (146-169)/(74-87) 162/87 (08/17 0640) SpO2:  [94 %-98 %] 94 % (08/17 0640) Weight:  [96.6 kg] 96.6 kg (08/17 0616) Weight change: -0.544 kg  Intake/Output from previous day: 08/16 0701 - 08/17 0700 In: 205.5 [IV Piggyback:200.5] Out: 410 [Urine:400; Drains:10] Intake/Output this shift: No intake/output data recorded.  General appearance: alert, cooperative and mild distress Resp: clear to auscultation bilaterally Cardio: S1, S2 normal and systolic murmur: systolic ejection 2/6, decrescendo at 2nd left intercostal space GI: drain RUQ , pos bs, mild distension Extremities: edema 3+ edema  Lab Results: BMET:  Recent Labs    01/18/18 0745 01/19/18 0641  NA 139 141  K 3.2* 3.5  CL 104 104  CO2 25 27  GLUCOSE 120* 107*  BUN 69* 51*  CREATININE 3.26* 2.37*  CALCIUM 7.3* 7.5*   No results for input(s): PTH in the last 72 hours. Iron Studies:  Recent Labs    01/17/18 0826  IRON 30  TIBC 214*    Studies/Results: No results found.  I have reviewed the patient's current medications.  Assessment/Plan: 1 AKI ischemic ATN from sepsis.  Diruesing,still vol xs. Recovering 2 Abscess/sepsis on AB, drain 3 Anemia got Fe, variable values P will not follow formally at this time, will see again at your request    LOS: 10 days   Jeneen Rinks Beacher Every 01/19/2018,7:55 AM

## 2018-01-19 NOTE — Progress Notes (Signed)
PROGRESS NOTE    ANTONIQUE LANGFORD  EUM:[id number] DOB: December 26, 1955 DOA: 01/09/2018 PCP: Wenda Low, MD      Brief Narrative:  Mrs. Fortune is a 62 y.o. F with no significant PMHx (maybe remote history of HTN, not currently on medicine) who presented with fever, sepsis abdominal pain, found to have liver abscess, septic shock, liver failure.  Admitted to ICU    Assessment & Plan:  Septic shock from liver abscess Blood cultures growing viridans strep alpha hemoly, abscess aspirated 8/9, culture of abscess growing group F strep.   Echo without signs of vegetation, repeat cultures negative. Infectious disease consulted.   Treated initially with broad spectrum empiric antibiotics, narrowed to Zosyn, on 8/12 transition to ceftriaxone.   -We will plan for IV ceftriaxone until 8/28 then repeat CT "to determine if she needs more IV or can change to p.o."  Continue ceftriaxone -Repeat CT 8/28 -Needs outpatient update of colonoscopy for GDS bacteremia  Normocytic anemia Baseline hemoglobin 12 on admission.  Bone marrow suppression from infection, renal injury, and phlebotomy.  No evidence at all of clinical bleeding.  No intelligible hemoglobin yesterday, today hemoglobin 7.2 g/dL, in line with previous.  Likely losses from her abscess drain, as her drain bulb consistently has some bloody serous fluid in it -Transfusion threshold 7 g/dL -Trend CBC  Acute renal failure Creatinine down to 2.5 mg/dL.  Still making good diuresis without Lasix. -Daily BMP -Nephrology consult, appreciate cares -Stop bicarb  SVT Brief episode of SVT in the context of hypokalemia.  Mag normal. -Keep K>4, Mag>2  Thrombocytopenia Nadir 39K, has recovered well. Required 2 units transufion earlier.  -Trend CBC  Liver mass Biopsy negative for cancer.     DVT prophylaxis: SCDs Code Status: FULL Family Communication: Daughter at bedside MDM and disposition Plan: Below labs and imaging reports were  reviewed and summarized above.  Medication management as above.  The patient was admitted with septic shock from a liver abscess.  This was aspirated on 8/9, and has been on ceftriaxone.  Her thrombocytopenia and multiorgan failure is resolving.  Her renal function has gradually been improving, nephrology has signed off.  We will trend her hemoglobin 1 additional day, as there was a question of transfusion yesterday.  If we do not need to transfuse we will likely discharge in 24 to 48 hours.        Consultants:   Nephrology  Procedures:   Liver biopsy and catheter drainage 8/9  Antimicrobials:   Flagyl x1 8/7  Vancomycin x1 8/7  Zosyn 8/7 >> 8/11  Ceftriaxone 8/12 >>    Culture data:   8/7 admission Blood cultures x2: 2/2 viridans strep  8/9 blood culture repeat x1: NGTD  8/9 abscess aspirate: Group F strep       Subjective: Doing well, no melena, hematochezia, hematuria, vomiting.  She is tired but gradually strengthening.  No new fever, confusion, dizziness, syncope, dyspnea, exertional intolerance.  sWelling is improving.  Objective: Vitals:   01/18/18 1204 01/18/18 2147 01/19/18 0616 01/19/18 0640  BP: (!) 146/74 (!) 169/86  (!) 162/87  Pulse:  72  75  Resp:  12    Temp: 97.6 F (36.4 C) 98.2 F (36.8 C)  98.5 F (36.9 C)  TempSrc: Oral Oral  Oral  SpO2:  98%  94%  Weight:   96.6 kg   Height:        Intake/Output Summary (Last 24 hours) at 01/19/2018 1039 Last data filed at 01/18/2018 2200 Gross  per 24 hour  Intake 205.53 ml  Output 410 ml  Net -204.47 ml   Filed Weights   01/17/18 0540 01/18/18 0626 01/19/18 0616  Weight: 98.2 kg 97.1 kg 96.6 kg    Examination: General appearance: Adult female, ambulating in room, no acute distress, interactive HEENT: Anicteric, conjunctival pink, lids and lashes normal.  No nasal deformity, discharge, or epistaxis.  Lips moist, teeth normal, oropharynx moist, no oral lesions, hearing normal. Skin: Warm  and dry, no suspicious rashes or lesions. Cardiac: Regular rate and rhythm, no tachycardia, no murmur.  Moderate pitting edema. Respiratory: Normal respiratory rate and rhythm, lungs clear without rales or wheezes. Abdomen: Abdomen soft without tenderness to palpation, ascites, or distention.  There is moderate bloody fluid in her liver drain.   MSK: No deformities or effusions of the large joints of the upper or lower extremities bilaterally. Neuro: Awake and alert, extraocular movements intact, moves all extremities with normal strength, symmetric coordination, speech fluent.    Psych: Sensorium intact and responding to questions, attention normal, affect normal.  Judgment and insight appear normal.     Data Reviewed: I have personally reviewed following labs and imaging studies:  CBC: Recent Labs  Lab 01/13/18 0400  01/16/18 0345 01/17/18 0500 01/18/18 0629 01/18/18 0745 01/19/18 0641  WBC 30.4*   < > 37.7* 35.6* 26.0* 19.6* 27.1*  NEUTROABS 27.4*  --   --   --   --   --   --   HGB 7.5*   < > 7.6* 7.3* 5.7* 11.0* 7.2*  HCT 21.2*   < > 20.7* 20.3* 16.3* 30.5* 21.1*  MCV 83.1   < > 80.9 80.9 83.2 80.9 85.1  PLT 39*   < > 112* 146* 160 158 287   < > = values in this interval not displayed.   Basic Metabolic Panel: Recent Labs  Lab 01/15/18 0500 01/16/18 0345 01/17/18 0500 01/17/18 1216 01/18/18 0745 01/19/18 0641  NA 133* 133* 135  --  139 141  K 3.5 3.4* 3.2*  --  3.2* 3.5  CL 101 100 100  --  104 104  CO2 18* 20* 22  --  25 27  GLUCOSE 122* 131* 138*  --  120* 107*  BUN 91* 93* 86*  --  69* 51*  CREATININE 5.42* 4.99* 4.24*  --  3.26* 2.37*  CALCIUM 7.3* 7.3* 7.2*  --  7.3* 7.5*  MG  --   --   --  2.2  --   --   PHOS 5.7* 6.1* 5.5*  --  4.4 3.7   GFR: Estimated Creatinine Clearance: 32.1 mL/min (A) (by C-G formula based on SCr of 2.37 mg/dL (H)). Liver Function Tests: Recent Labs  Lab 01/13/18 0400 01/14/18 0350 01/15/18 0500 01/16/18 0345 01/17/18 0500  01/18/18 0745 01/19/18 0641  AST 554* 260* 150*  --   --   --   --   ALT 549* 466* 335*  --   --   --   --   ALKPHOS 148* 193* 259*  --   --   --   --   BILITOT 2.0* 1.9* 1.5*  --   --   --   --   PROT 4.6* 5.2* 5.5*  --   --   --   --   ALBUMIN 1.8* 1.8* 1.8* 1.8* 1.8* 1.9* 1.9*   No results for input(s): LIPASE, AMYLASE in the last 168 hours. No results for input(s): AMMONIA in the last 168 hours.  Coagulation Profile: No results for input(s): INR, PROTIME in the last 168 hours. Cardiac Enzymes: No results for input(s): CKTOTAL, CKMB, CKMBINDEX, TROPONINI in the last 168 hours. BNP (last 3 results) No results for input(s): PROBNP in the last 8760 hours. HbA1C: No results for input(s): HGBA1C in the last 72 hours. CBG: No results for input(s): GLUCAP in the last 168 hours. Lipid Profile: No results for input(s): CHOL, HDL, LDLCALC, TRIG, CHOLHDL, LDLDIRECT in the last 72 hours. Thyroid Function Tests: No results for input(s): TSH, T4TOTAL, FREET4, T3FREE, THYROIDAB in the last 72 hours. Anemia Panel: Recent Labs    01/17/18 0826  TIBC 214*  IRON 30   Urine analysis:    Component Value Date/Time   COLORURINE YELLOW 01/14/2018 1300   APPEARANCEUR HAZY (A) 01/14/2018 1300   LABSPEC 1.010 01/14/2018 1300   PHURINE 5.0 01/14/2018 1300   GLUCOSEU NEGATIVE 01/14/2018 1300   HGBUR SMALL (A) 01/14/2018 1300   BILIRUBINUR NEGATIVE 01/14/2018 1300   KETONESUR NEGATIVE 01/14/2018 1300   PROTEINUR NEGATIVE 01/14/2018 1300   NITRITE NEGATIVE 01/14/2018 1300   LEUKOCYTESUR NEGATIVE 01/14/2018 1300   Sepsis Labs: @LABRCNTIP (procalcitonin:4,lacticacidven:4)  ) Recent Results (from the past 240 hour(s))  Blood Culture (routine x 2)     Status: Abnormal   Collection Time: 01/09/18 11:40 AM  Result Value Ref Range Status   Specimen Description BLOOD RIGHT ANTECUBITAL  Final   Special Requests   Final    BOTTLES DRAWN AEROBIC AND ANAEROBIC Blood Culture adequate volume   Culture   Setup Time   Final    GRAM POSITIVE COCCI IN CHAINS IN BOTH AEROBIC AND ANAEROBIC BOTTLES CRITICAL RESULT CALLED TO, READ BACK BY AND VERIFIED WITH: Laurice Record PharmD 16:10 01/10/18 ([email or web address])    Culture (A)  Final    VIRIDANS STREPTOCOCCUS SUSCEPTIBILITIES PERFORMED ON PREVIOUS CULTURE WITHIN THE LAST 5 DAYS. Performed at [facility], [place] [address], [place], [place] [address]    Report Status 01/13/2018 FINAL  Final  Blood Culture ID Panel (Reflexed)     Status: Abnormal   Collection Time: 01/09/18 11:40 AM  Result Value Ref Range Status   Enterococcus species NOT DETECTED NOT DETECTED Final   Listeria monocytogenes NOT DETECTED NOT DETECTED Final   Staphylococcus species NOT DETECTED NOT DETECTED Final   Staphylococcus aureus NOT DETECTED NOT DETECTED Final   Streptococcus species DETECTED (A) NOT DETECTED Final    Comment: Not Enterococcus species, Streptococcus agalactiae, Streptococcus pyogenes, or Streptococcus pneumoniae. CRITICAL RESULT CALLED TO, READ BACK BY AND VERIFIED WITH: Laurice Record PharmD 16:10 01/10/18 ([email or web address])    Streptococcus agalactiae NOT DETECTED NOT DETECTED Final   Streptococcus pneumoniae NOT DETECTED NOT DETECTED Final   Streptococcus pyogenes NOT DETECTED NOT DETECTED Final   Acinetobacter baumannii NOT DETECTED NOT DETECTED Final   Enterobacteriaceae species NOT DETECTED NOT DETECTED Final   Enterobacter cloacae complex NOT DETECTED NOT DETECTED Final   Escherichia coli NOT DETECTED NOT DETECTED Final   Klebsiella oxytoca NOT DETECTED NOT DETECTED Final   Klebsiella pneumoniae NOT DETECTED NOT DETECTED Final   Proteus species NOT DETECTED NOT DETECTED Final   Serratia marcescens NOT DETECTED NOT DETECTED Final   Haemophilus influenzae NOT DETECTED NOT DETECTED Final   Neisseria meningitidis NOT DETECTED NOT DETECTED Final   Pseudomonas aeruginosa NOT DETECTED NOT DETECTED Final   Candida albicans NOT DETECTED NOT DETECTED Final   Candida glabrata  NOT DETECTED NOT DETECTED Final   Candida krusei NOT DETECTED NOT DETECTED Final  Candida parapsilosis NOT DETECTED NOT DETECTED Final   Candida tropicalis NOT DETECTED NOT DETECTED Final    Comment: Performed at [facility], [place] [address], [place], [place] [address]  Blood Culture (routine x 2)     Status: Abnormal   Collection Time: 01/09/18 11:50 AM  Result Value Ref Range Status   Specimen Description BLOOD LEFT ANTECUBITAL  Final   Special Requests   Final    BOTTLES DRAWN AEROBIC AND ANAEROBIC Blood Culture adequate volume   Culture  Setup Time   Final    GRAM POSITIVE COCCI IN CHAINS ANAEROBIC BOTTLE ONLY CRITICAL VALUE NOTED.  VALUE IS CONSISTENT WITH PREVIOUSLY REPORTED AND CALLED VALUE. Performed at [facility], [place] [address] [place], [place] [address]    Culture VIRIDANS STREPTOCOCCUS (A)  Final   Report Status 01/13/2018 FINAL  Final   Organism ID, Bacteria VIRIDANS STREPTOCOCCUS  Final      Susceptibility   Viridans streptococcus - MIC*    PENICILLIN <=0.06 SENSITIVE Sensitive     ERYTHROMYCIN <=0.12 SENSITIVE Sensitive     LEVOFLOXACIN 0.5 SENSITIVE Sensitive     VANCOMYCIN 0.5 SENSITIVE Sensitive     * VIRIDANS STREPTOCOCCUS  MRSA PCR Screening     Status: None   Collection Time: 01/09/18  4:31 PM  Result Value Ref Range Status   MRSA by PCR NEGATIVE NEGATIVE Final    Comment:        The GeneXpert MRSA Assay (FDA approved for NASAL specimens only), is one component of a comprehensive MRSA colonization surveillance program. It is not intended to diagnose MRSA infection nor to guide or monitor treatment for MRSA infections. Performed at [facility], [place] [address], [place], [place] [address]   Culture, blood (routine x 2)     Status: None   Collection Time: 01/11/18 12:18 AM  Result Value Ref Range Status   Specimen Description BLOOD RIGHT PICC LINE  Final   Special Requests   Final    BOTTLES DRAWN AEROBIC AND ANAEROBIC Blood  Culture adequate volume   Culture   Final    NO GROWTH 6 DAYS Performed at [facility], [place] [address], [place], [place] [address]    Report Status 01/18/2018 FINAL  Final  Aerobic/Anaerobic Culture (surgical/deep wound)     Status: None   Collection Time: 01/11/18  2:27 PM  Result Value Ref Range Status   Specimen Description ABSCESS  Final   Special Requests Normal  Final   Gram Stain   Final    MODERATE WBC PRESENT, PREDOMINANTLY PMN MODERATE GRAM POSITIVE COCCI    Culture   Final    FEW STREPTOCOCCUS GROUP F NO ANAEROBES ISOLATED Performed at [facility], [place] [address] [place], [place] [address]    Report Status 01/16/2018 FINAL  Final  Culture, blood (routine x 2)     Status: None   Collection Time: 01/11/18  9:29 PM  Result Value Ref Range Status   Specimen Description BLOOD BLOOD LEFT FOREARM  Final   Special Requests   Final    BOTTLES DRAWN AEROBIC ONLY Blood Culture results may not be optimal due to an inadequate volume of blood received in culture bottles   Culture   Final    NO GROWTH 5 DAYS Performed at [facility], [place] [address] [place], [place] [address]    Report Status 01/16/2018 FINAL  Final  Radiology Studies: No results found.      Scheduled Meds: . potassium chloride  40 mEq Oral Once  . sodium bicarbonate  1,300 mg Oral TID  . sodium chloride flush  5 mL Intracatheter Q8H   Continuous Infusions: . cefTRIAXone (ROCEPHIN)  IV Stopped (01/18/18 1132)     LOS: 10 days    Time spent: 25 minutes    Edwin Dada, MD [facility] 01/19/2018, 10:39 AM     Pager [phone] --- please page though AMION:  [email or web address] Password [email or web address] If 7PM-7AM, please contact night-coverage

## 2018-01-19 NOTE — Care Management (Addendum)
Plan is to d/c patient tomorrow with home IV antibiotics.  Patient is set up with [facility].  Drug orders signed by Dr. Loleta Books and sent to [facility] to prepare for d/c.  Jermaine with [facility] updated on referral.    Pt can be d/c tomorrow after morning antibiotic dose if medically stable with plan for first home dose to be Monday 8/19.  CM will continue to follow tomorrow.   [facility] notified of patient's needs for RW and 3n1.  DME will be delivered to patient's room prior to dc.

## 2018-01-20 ENCOUNTER — Inpatient Hospital Stay (HOSPITAL_COMMUNITY)

## 2018-01-20 LAB — HEMOGLOBIN AND HEMATOCRIT, BLOOD
HEMATOCRIT: 22.9 % — AB (ref 36.0–46.0)
Hemoglobin: 7.6 g/dL — ABNORMAL LOW (ref 12.0–15.0)

## 2018-01-20 LAB — RENAL FUNCTION PANEL
ALBUMIN: 1.9 g/dL — AB (ref 3.5–5.0)
ANION GAP: 6 (ref 5–15)
BUN: 40 mg/dL — ABNORMAL HIGH (ref 8–23)
CALCIUM: 7.5 mg/dL — AB (ref 8.9–10.3)
CO2: 30 mmol/L (ref 22–32)
Chloride: 108 mmol/L (ref 98–111)
Creatinine, Ser: 2.03 mg/dL — ABNORMAL HIGH (ref 0.44–1.00)
GFR calc Af Amer: 29 mL/min — ABNORMAL LOW (ref >60)
GFR calc non Af Amer: 25 mL/min — ABNORMAL LOW (ref >60)
GLUCOSE: 126 mg/dL — AB (ref 70–99)
PHOSPHORUS: 3.3 mg/dL (ref 2.5–4.6)
Potassium: 4.2 mmol/L (ref 3.5–5.1)
SODIUM: 144 mmol/L (ref 135–145)

## 2018-01-20 LAB — CBC
HCT: 19.9 % — ABNORMAL LOW (ref 36.0–46.0)
HEMOGLOBIN: 6.7 g/dL — AB (ref 12.0–15.0)
MCH: 29.1 pg (ref 26.0–34.0)
MCHC: 33.7 g/dL (ref 30.0–36.0)
MCV: 86.5 fL (ref 78.0–100.0)
PLATELETS: 352 10*3/uL (ref 150–400)
RBC: 2.3 MIL/uL — ABNORMAL LOW (ref 3.87–5.11)
RDW: 15.4 % (ref 11.5–15.5)
WBC: 25.6 10*3/uL — ABNORMAL HIGH (ref 4.0–10.5)

## 2018-01-20 LAB — PREPARE RBC (CROSSMATCH)

## 2018-01-20 MED ORDER — SODIUM CHLORIDE 0.9% IV SOLUTION: Freq: Once | INTRAVENOUS | Status: DC

## 2018-01-20 NOTE — Progress Notes (Signed)
Patient ambulated in hallway today with family and walker. Damya Comley, Bettina Gavia RN

## 2018-01-20 NOTE — Care Management (Signed)
Pt is not ready for d/c today.  Plan is for patient to go home tomorrow after morning antibiotics.

## 2018-01-20 NOTE — Progress Notes (Signed)
PROGRESS NOTE    Lisa Bauer  OFB:[id number] DOB: 20-Jan-1956 DOA: 01/09/2018 PCP: Wenda Low, MD      Brief Narrative:  Lisa Bauer is a 62 y.o. F with diet-controlled HTN who presented with fever, sepsis syndrome and abdominal pain --> started on empiric broad-spectrum antibiotics, admitted to ICU on pressors.  Found to have liver abscess, thrombus cytopenia, septic shock, renal failure.       Assessment & Plan:  Septic shock from liver abscess Discussed with ID, interventional radiology. -Repeat CT abdomen today before discharge -Continue ceftriaxone  Normocytic anemia Hgb <7 g/dL today.  Will transfuse 2 units.   -Repeat CBC in AM  Acute renal failure Creatinine trending down still, good UOP.  Swelling improving.  SVT  Thrombocytopenia Resolved.  Liver mass Biopsy negative for cancer.     DVT prophylaxis: SCDs Code Status: FULL Family Communication: Daughter at bedside MDM and disposition Plan: The below labs and imaging reports were reviewed and summarized above.  Medication management as above including transfusion of blood products.  The patient was admitted with septic shock from a liver abscess.  This was aspirated on 8/9, and has been on ceftriaxone.  Her thrombocytopenia and multiorgan failure is resolving.  Her renal function has not been improving steadily.  Nephrology has signed off.  Today she will require transfusion of 2 units of blood, repeat tomorrow, I discussed the case with radiology, recommend repeat CT imaging now before discharge.       Consultants:   Nephrology  Interventional radiology  Critical care  Procedures:   Liver biopsy and catheter drainage 8/9  Antimicrobials:   Flagyl x1 8/7  Vancomycin x1 8/7  Zosyn 8/7 >> 8/11  Ceftriaxone 8/12 >>    Culture data:   8/7 admission Blood cultures x2: 2/2 viridans strep  8/9 blood culture repeat x1: NGTD  8/9 abscess aspirate: Group F  strep       Subjective: Feeling well.  No melena, hematochezia, hematuria.  She is tired but able to walk several 100 feet without assistance, just with a walker.  No new fever, confusion, dizziness, syncope, dyspnea, exertional intolerance.  Swelling is improving.  She tolerated the first unit of blood this morning well.  Objective: Vitals:   01/20/18 0930 01/20/18 0945 01/20/18 0950 01/20/18 1215  BP: (!) 149/73 (!) 150/70 (!) 150/70 (!) 148/69  Pulse:  76  82  Resp:  18  18  Temp:  98.4 F (36.9 C)  98.4 F (36.9 C)  TempSrc:  Oral  Oral  SpO2:  100%    Weight:      Height:        Intake/Output Summary (Last 24 hours) at 01/20/2018 1547 Last data filed at 01/20/2018 1309 Gross per 24 hour  Intake 673.36 ml  Output 620 ml  Net 53.36 ml   Filed Weights   01/18/18 0626 01/19/18 0616 01/20/18 0439  Weight: 97.1 kg 96.6 kg 96.6 kg    Examination: General appearance: Adult female, ambulating in the hallway, no acute distress, interactive HEENT: Anicteric, conjunctival pink, lids and lashes normal.  No nasal deformity, discharge, or epistaxis.  Lips moist, teeth normal, oropharynx moist, no oral lesions.  Hearing normal. Skin: Skin warm and dry, mild pallor. Cardiac: Regular rate and rhythm, no tachycardia, no murmur.  Moderate pitting edema bilateral lower extremities. Respiratory: Normal respiratory rate and rhythm, lungs clear without rales or wheezes. Abdomen: Abdomen soft without tenderness to palpation, ascites, or distention.  MSK: No deformities or  effusions of the large joints of the upper or lower extremities bilaterally. Neuro: Awake and alert, extraocular movements intact, gait slow and shuffling, but without antalgia.  Moves all extremities with normal strength, symmetric coordination.  Speech fluent.    Psych: Sensorium intact and responding to questions, attention normal, affect normal judgment insight normal.     Data Reviewed: I have personally reviewed  following labs and imaging studies:  CBC: Recent Labs  Lab 01/17/18 0500 01/18/18 0629 01/18/18 0745 01/19/18 0641 01/20/18 0445 01/20/18 1442  WBC 35.6* 26.0* 19.6* 27.1* 25.6*  --   HGB 7.3* 5.7* 11.0* 7.2* 6.7* 7.6*  HCT 20.3* 16.3* 30.5* 21.1* 19.9* 22.9*  MCV 80.9 83.2 80.9 85.1 86.5  --   PLT 146* 160 158 287 352  --    Basic Metabolic Panel: Recent Labs  Lab 01/16/18 0345 01/17/18 0500 01/17/18 1216 01/18/18 0745 01/19/18 0641 01/20/18 0445  NA 133* 135  --  139 141 144  K 3.4* 3.2*  --  3.2* 3.5 4.2  CL 100 100  --  104 104 108  CO2 20* 22  --  25 27 30   GLUCOSE 131* 138*  --  120* 107* 126*  BUN 93* 86*  --  69* 51* 40*  CREATININE 4.99* 4.24*  --  3.26* 2.37* 2.03*  CALCIUM 7.3* 7.2*  --  7.3* 7.5* 7.5*  MG  --   --  2.2  --   --   --   PHOS 6.1* 5.5*  --  4.4 3.7 3.3   GFR: Estimated Creatinine Clearance: 37.4 mL/min (A) (by C-G formula based on SCr of 2.03 mg/dL (H)). Liver Function Tests: Recent Labs  Lab 01/14/18 0350 01/15/18 0500 01/16/18 0345 01/17/18 0500 01/18/18 0745 01/19/18 0641 01/20/18 0445  AST 260* 150*  --   --   --   --   --   ALT 466* 335*  --   --   --   --   --   ALKPHOS 193* 259*  --   --   --   --   --   BILITOT 1.9* 1.5*  --   --   --   --   --   PROT 5.2* 5.5*  --   --   --   --   --   ALBUMIN 1.8* 1.8* 1.8* 1.8* 1.9* 1.9* 1.9*   No results for input(s): LIPASE, AMYLASE in the last 168 hours. No results for input(s): AMMONIA in the last 168 hours. Coagulation Profile: No results for input(s): INR, PROTIME in the last 168 hours. Cardiac Enzymes: No results for input(s): CKTOTAL, CKMB, CKMBINDEX, TROPONINI in the last 168 hours. BNP (last 3 results) No results for input(s): PROBNP in the last 8760 hours. HbA1C: No results for input(s): HGBA1C in the last 72 hours. CBG: No results for input(s): GLUCAP in the last 168 hours. Lipid Profile: No results for input(s): CHOL, HDL, LDLCALC, TRIG, CHOLHDL, LDLDIRECT in the  last 72 hours. Thyroid Function Tests: No results for input(s): TSH, T4TOTAL, FREET4, T3FREE, THYROIDAB in the last 72 hours. Anemia Panel: No results for input(s): VITAMINB12, FOLATE, FERRITIN, TIBC, IRON, RETICCTPCT in the last 72 hours. Urine analysis:    Component Value Date/Time   COLORURINE YELLOW 01/14/2018 1300   APPEARANCEUR HAZY (A) 01/14/2018 1300   LABSPEC 1.010 01/14/2018 1300   PHURINE 5.0 01/14/2018 1300   GLUCOSEU NEGATIVE 01/14/2018 1300   HGBUR SMALL (A) 01/14/2018 1300   BILIRUBINUR NEGATIVE 01/14/2018 1300  KETONESUR NEGATIVE 01/14/2018 1300   PROTEINUR NEGATIVE 01/14/2018 1300   NITRITE NEGATIVE 01/14/2018 1300   LEUKOCYTESUR NEGATIVE 01/14/2018 1300   Sepsis Labs: @LABRCNTIP (procalcitonin:4,lacticacidven:4)  ) Recent Results (from the past 240 hour(s))  Culture, blood (routine x 2)     Status: None   Collection Time: 01/11/18 12:18 AM  Result Value Ref Range Status   Specimen Description BLOOD RIGHT PICC LINE  Final   Special Requests   Final    BOTTLES DRAWN AEROBIC AND ANAEROBIC Blood Culture adequate volume   Culture   Final    NO GROWTH 6 DAYS Performed at [facility], [place] [address] [place], [place] [address]    Report Status 01/18/2018 FINAL  Final  Aerobic/Anaerobic Culture (surgical/deep wound)     Status: None   Collection Time: 01/11/18  2:27 PM  Result Value Ref Range Status   Specimen Description ABSCESS  Final   Special Requests Normal  Final   Gram Stain   Final    MODERATE WBC PRESENT, PREDOMINANTLY PMN MODERATE GRAM POSITIVE COCCI    Culture   Final    FEW STREPTOCOCCUS GROUP F NO ANAEROBES ISOLATED Performed at [facility], [place] [address], [place], [place] [address]    Report Status 01/16/2018 FINAL  Final  Culture, blood (routine x 2)     Status: None   Collection Time: 01/11/18  9:29 PM  Result Value Ref Range Status   Specimen Description BLOOD BLOOD LEFT FOREARM  Final   Special Requests   Final     BOTTLES DRAWN AEROBIC ONLY Blood Culture results may not be optimal due to an inadequate volume of blood received in culture bottles   Culture   Final    NO GROWTH 5 DAYS Performed at [facility], [place] [address], [place], [place] [address]    Report Status 01/16/2018 FINAL  Final         Radiology Studies: No results found.      Scheduled Meds: . sodium chloride   Intravenous Once  . potassium chloride  10 mEq Oral Daily  . sodium chloride flush  5 mL Intracatheter Q8H   Continuous Infusions: . cefTRIAXone (ROCEPHIN)  IV 200 mL/hr at 01/20/18 1309     LOS: 11 days    Time spent: 25 minutes    Edwin Dada, MD [facility] 01/20/2018, 3:47 PM     Pager [phone] --- please page though AMION:  [email or web address] Password [email or web address] If 7PM-7AM, please contact night-coverage

## 2018-01-21 DIAGNOSIS — B954 Other streptococcus as the cause of diseases classified elsewhere: Secondary | ICD-10-CM

## 2018-01-21 DIAGNOSIS — N289 Disorder of kidney and ureter, unspecified: Secondary | ICD-10-CM

## 2018-01-21 LAB — BPAM RBC
Blood Product Expiration Date: 201908272359
Blood Product Expiration Date: 201908292359
ISSUE DATE / TIME: 201908180917
ISSUE DATE / TIME: 201908181752
UNIT TYPE AND RH: 600
Unit Type and Rh: 600

## 2018-01-21 LAB — CBC
HCT: 25.2 % — ABNORMAL LOW (ref 36.0–46.0)
HEMOGLOBIN: 8.3 g/dL — AB (ref 12.0–15.0)
MCH: 28.8 pg (ref 26.0–34.0)
MCHC: 32.9 g/dL (ref 30.0–36.0)
MCV: 87.5 fL (ref 78.0–100.0)
Platelets: 389 10*3/uL (ref 150–400)
RBC: 2.88 MIL/uL — AB (ref 3.87–5.11)
RDW: 15.8 % — ABNORMAL HIGH (ref 11.5–15.5)
WBC: 22.4 10*3/uL — ABNORMAL HIGH (ref 4.0–10.5)

## 2018-01-21 LAB — RENAL FUNCTION PANEL
ANION GAP: 8 (ref 5–15)
Albumin: 2 g/dL — ABNORMAL LOW (ref 3.5–5.0)
BUN: 28 mg/dL — ABNORMAL HIGH (ref 8–23)
CO2: 28 mmol/L (ref 22–32)
Calcium: 7.5 mg/dL — ABNORMAL LOW (ref 8.9–10.3)
Chloride: 106 mmol/L (ref 98–111)
Creatinine, Ser: 1.59 mg/dL — ABNORMAL HIGH (ref 0.44–1.00)
GFR calc Af Amer: 39 mL/min — ABNORMAL LOW (ref >60)
GFR calc non Af Amer: 34 mL/min — ABNORMAL LOW (ref >60)
GLUCOSE: 100 mg/dL — AB (ref 70–99)
Phosphorus: 3 mg/dL (ref 2.5–4.6)
Potassium: 3.9 mmol/L (ref 3.5–5.1)
SODIUM: 142 mmol/L (ref 135–145)

## 2018-01-21 LAB — TYPE AND SCREEN
ABO/RH(D): A NEG
ANTIBODY SCREEN: NEGATIVE
UNIT DIVISION: 0
Unit division: 0

## 2018-01-21 LAB — HEPATITIS C ANTIBODY (REFLEX): HCV Ab: <0.1 s/co ratio (ref 0.0–0.9)

## 2018-01-21 LAB — HCV COMMENT:

## 2018-01-21 MED ORDER — OXYCODONE HCL 5 MG PO TABS
2.5000 mg | ORAL_TABLET | Freq: Every evening | ORAL | Status: DC | PRN
  Administered 2018-01-21 – 2018-01-23 (×4): 2.5 mg via ORAL
  Administered 2018-01-23: 5 mg via ORAL
  Administered 2018-01-24: 2.5 mg via ORAL
  Filled 2018-01-21 (×7): qty 1

## 2018-01-21 MED ORDER — CEFTRIAXONE IV (FOR PTA / DISCHARGE USE ONLY): 2.0000 g | INTRAVENOUS | 0 refills | Status: AC

## 2018-01-21 MED ORDER — HYDRALAZINE HCL 25 MG PO TABS
25.0000 mg | ORAL_TABLET | Freq: Three times a day (TID) | ORAL | Status: DC
  Administered 2018-01-22 – 2018-01-23 (×4): 25 mg via ORAL
  Filled 2018-01-21 (×4): qty 1

## 2018-01-21 NOTE — Progress Notes (Signed)
PROGRESS NOTE    Lisa Bauer  RJJ:[id number] DOB: 1956/02/13 DOA: 01/09/2018 PCP: Wenda Low, MD      Brief Narrative:  Lisa Bauer is a 62 y.o. F with diet-controlled HTN who presented with fever, sepsis syndrome and abdominal pain --> started on empiric broad-spectrum antibiotics, admitted to ICU on pressors.  Found to have liver abscess, thrombus cytopenia, septic shock, renal failure.       Assessment & Plan:  Septic shock from liver abscess Afebrile since drain placed and WBC trending down since peak 37K on 8/12.  Repeat CT showed interval slight enlargement of main abscess, as well as small posterior lesion.  Discussed with Dr.  Marland KitchenContinue ceftriaxone -Appreciate ID and IR involvement   Liver hematoma Largest drop in Hgb occurred actually at admission and then in the days surrounding the urgent drain placement (at that time, note, platelets hit nadir <50K).  Disregard spurious Hgbs on 8/16.  Since 8/12, Hgb was only very gradually drifting down until yesterday <7g/dL, given 2 units transfusion, appropriate bump. -IR will re-evaluate patient re: coiling/embolization or repeat drain  Acute blood loss anemia, with superimposed critical illness anemia Hgb loss into liver hematoma, as well as superimposed bone marrow suppression. Iron replete. Transfused 2 units yesterday, appropriate bump. -Trend CBC  Acute renal failure Creatinine down to 1.59 mg/dL today, doing well.  Good UOP.  SVT  Thrombocytopenia Resolved.  Did receive 2 units PRBCs.  Liver mass Biopsy negative for cancer.     DVT prophylaxis: SCDs Code Status: FULL Family Communication: son and Daughter at the bedside MDM and disposition Plan: The below labs and imaging reports were reviewed and summarized above.  Medication management as above.  Discussed with infectious disease and interventional radiology, in particular regarding CT imaging.  The patient was admitted with septic shock from a  liver abscess.  This was aspirated on 8/9, and has been on ceftriaxone for group F strep from her abscess and group D strep and her blood.  Her thrombocytopenia and multiorgan failure are mostly resolved.  Her renal function has not been improving steadily.  Nephrology has signed off.  Yesterday she required 2 units of blood transfusion for continued blood loss, from what now appears to be a hematoma/blood collection in her liver abscess.  This is discussed with radiology today, will reevaluate the patient.  It is unsafe at this point to discharge the patient until hemoglobin has stabilized more.  We will ask infectious disease also to provide comment on her new CAT scan.         Consultants:   Nephrology  Interventional radiology  Critical care  Procedures:   Liver biopsy and catheter drainage 8/9  Antimicrobials:   Flagyl x1 8/7  Vancomycin x1 8/7  Zosyn 8/7 >> 8/11  Ceftriaxone 8/12 >>    Culture data:   8/7 admission Blood cultures x2: 2/2 viridans strep  8/9 blood culture repeat x1: NGTD  8/9 abscess aspirate: Group F strep       Subjective: She still has moderate pain, waxing and waning, worse with sitting still or bending over, and the right upper quadrant, requiring both oral and IV opiates.  She has had no melena, hematochezia.  Her exercise tolerance is improving gradually, she had no new fever, worsening pain, confusion, jaundice, vomiting, or diarrhea.  Objective: Vitals:   01/20/18 1950 01/20/18 2050 01/21/18 0235 01/21/18 0555  BP: (!) 168/83 (!) 157/75 (!) 168/85   Pulse: 79 78 79   Resp:  18    Temp: 99 F (37.2 C) 98.7 F (37.1 C) 99 F (37.2 C)   TempSrc: Oral Oral Oral   SpO2: 98% 100% 98%   Weight:    97.3 kg  Height:        Intake/Output Summary (Last 24 hours) at 01/21/2018 1142 Last data filed at 01/21/2018 0235 Gross per 24 hour  Intake 1215.62 ml  Output 30 ml  Net 1185.62 ml   Filed Weights   01/19/18 0616 01/20/18 0439  01/21/18 0555  Weight: 96.6 kg 96.6 kg 97.3 kg    Examination: General appearance: Adult female, ambulating in the hallway, no acute distress HEENT: Anicteric, conjunctival pink, lids and lashes normal, no nasal deformity, discharge, or epistaxis.  Lips moist, teeth normal, hearing normal. Skin: Warm and dry, mild pallor. Cardiac: Regular rate and rhythm, no tachycardia, no murmur.  Moderate pitting edema bilateral lower extremities. Respiratory: Normal respiratory rate and rhythm, lungs clear without rales or wheezes. Abdomen: Abdomen soft without tenderness to palpation, ascites, or distention.  MSK: No deformities or effusions of the large joints of the upper or lower extremities bilaterally. Neuro: Alert, extra ocular movements intact, gait slow, but without antalgia.  Speech fluent.  Strength symmetric in upper and lower extremities bilaterally.    Psych: Sensorium intact and responding to questions, attention normal, affect normal.  Judgment and insight normal.    Data Reviewed: I have personally reviewed following labs and imaging studies:  CBC: Recent Labs  Lab 01/18/18 0629 01/18/18 0745 01/19/18 0641 01/20/18 0445 01/20/18 1442 01/21/18 0230  WBC 26.0* 19.6* 27.1* 25.6*  --  22.4*  HGB 5.7* 11.0* 7.2* 6.7* 7.6* 8.3*  HCT 16.3* 30.5* 21.1* 19.9* 22.9* 25.2*  MCV 83.2 80.9 85.1 86.5  --  87.5  PLT 160 158 287 352  --  194   Basic Metabolic Panel: Recent Labs  Lab 01/17/18 0500 01/17/18 1216 01/18/18 0745 01/19/18 0641 01/20/18 0445 01/21/18 0230  NA 135  --  139 141 144 142  K 3.2*  --  3.2* 3.5 4.2 3.9  CL 100  --  104 104 108 106  CO2 22  --  25 27 30 28   GLUCOSE 138*  --  120* 107* 126* 100*  BUN 86*  --  69* 51* 40* 28*  CREATININE 4.24*  --  3.26* 2.37* 2.03* 1.59*  CALCIUM 7.2*  --  7.3* 7.5* 7.5* 7.5*  MG  --  2.2  --   --   --   --   PHOS 5.5*  --  4.4 3.7 3.3 3.0   GFR: Estimated Creatinine Clearance: 48 mL/min (A) (by C-G formula based on SCr  of 1.59 mg/dL (H)). Liver Function Tests: Recent Labs  Lab 01/15/18 0500  01/17/18 0500 01/18/18 0745 01/19/18 0641 01/20/18 0445 01/21/18 0230  AST 150*  --   --   --   --   --   --   ALT 335*  --   --   --   --   --   --   ALKPHOS 259*  --   --   --   --   --   --   BILITOT 1.5*  --   --   --   --   --   --   PROT 5.5*  --   --   --   --   --   --   ALBUMIN 1.8*   < > 1.8* 1.9* 1.9* 1.9* 2.0*   < > =  values in this interval not displayed.   No results for input(s): LIPASE, AMYLASE in the last 168 hours. No results for input(s): AMMONIA in the last 168 hours. Coagulation Profile: No results for input(s): INR, PROTIME in the last 168 hours. Cardiac Enzymes: No results for input(s): CKTOTAL, CKMB, CKMBINDEX, TROPONINI in the last 168 hours. BNP (last 3 results) No results for input(s): PROBNP in the last 8760 hours. HbA1C: No results for input(s): HGBA1C in the last 72 hours. CBG: No results for input(s): GLUCAP in the last 168 hours. Lipid Profile: No results for input(s): CHOL, HDL, LDLCALC, TRIG, CHOLHDL, LDLDIRECT in the last 72 hours. Thyroid Function Tests: No results for input(s): TSH, T4TOTAL, FREET4, T3FREE, THYROIDAB in the last 72 hours. Anemia Panel: No results for input(s): VITAMINB12, FOLATE, FERRITIN, TIBC, IRON, RETICCTPCT in the last 72 hours. Urine analysis:    Component Value Date/Time   COLORURINE YELLOW 01/14/2018 1300   APPEARANCEUR HAZY (A) 01/14/2018 1300   LABSPEC 1.010 01/14/2018 1300   PHURINE 5.0 01/14/2018 1300   GLUCOSEU NEGATIVE 01/14/2018 1300   HGBUR SMALL (A) 01/14/2018 1300   BILIRUBINUR NEGATIVE 01/14/2018 1300   KETONESUR NEGATIVE 01/14/2018 1300   PROTEINUR NEGATIVE 01/14/2018 1300   NITRITE NEGATIVE 01/14/2018 1300   LEUKOCYTESUR NEGATIVE 01/14/2018 1300   Sepsis Labs: @LABRCNTIP (procalcitonin:4,lacticacidven:4)  ) Recent Results (from the past 240 hour(s))  Aerobic/Anaerobic Culture (surgical/deep wound)     Status: None     Collection Time: 01/11/18  2:27 PM  Result Value Ref Range Status   Specimen Description ABSCESS  Final   Special Requests Normal  Final   Gram Stain   Final    MODERATE WBC PRESENT, PREDOMINANTLY PMN MODERATE GRAM POSITIVE COCCI    Culture   Final    FEW STREPTOCOCCUS GROUP F NO ANAEROBES ISOLATED Performed at [facility], [place] [address] [place], [place] [address]    Report Status 01/16/2018 FINAL  Final  Culture, blood (routine x 2)     Status: None   Collection Time: 01/11/18  9:29 PM  Result Value Ref Range Status   Specimen Description BLOOD BLOOD LEFT FOREARM  Final   Special Requests   Final    BOTTLES DRAWN AEROBIC ONLY Blood Culture results may not be optimal due to an inadequate volume of blood received in culture bottles   Culture   Final    NO GROWTH 5 DAYS Performed at [facility], [place] [address] [place],  [address]    Report Status 01/16/2018 FINAL  Final         Radiology Studies: Ct Abdomen Pelvis Wo Contrast  Result Date: 01/21/2018 CLINICAL DATA:  Liver abscess. EXAM: CT ABDOMEN AND PELVIS WITHOUT CONTRAST TECHNIQUE: Multidetector CT imaging of the abdomen and pelvis was performed following the standard protocol without IV contrast. COMPARISON:  CT scan of January 09, 2018.  MRI of January 11, 2018. FINDINGS: Lower chest: Mild right pleural effusion is noted. Hepatobiliary: There is been interval placement of drainage catheter within large low density seen anteriorly in the liver. High density material is seen within this low density currently, suggesting hemorrhage. Overall, the low density is in large compared to prior exam, currently measuring 12.1 by 8.8 cm. There is interval development of subcapsular low density along the lateral portion of right hepatic lobe concerning for hematoma and/or purulent material. Stable 4.4 cm low density is noted posteriorly in right hepatic lobe. Pancreas: Unremarkable. No pancreatic ductal dilatation or  surrounding inflammatory changes.  Spleen: Normal in size without focal abnormality. Adrenals/Urinary Tract: Adrenal glands are unremarkable. Kidneys are normal, without renal calculi, focal lesion, or hydronephrosis. Bladder is unremarkable. Stomach/Bowel: Stomach is within normal limits. Appendix appears normal. No evidence of bowel wall thickening, distention, or inflammatory changes. Vascular/Lymphatic: No significant vascular findings are present. No enlarged abdominal or pelvic lymph nodes. Reproductive: Enlarged fibroid uterus is again noted. Other: Mild anasarca is noted.  No hernia is noted. Musculoskeletal: No acute or significant osseous findings. IMPRESSION: Interval placement of percutaneous drainage catheter into abscess noted anteriorly in the liver, with interval development of hemorrhage within the abscess. The abscess is enlarged compared to prior exam, now measuring 12.1 x 8.8 cm. There is interval development of moderate size subcapsular fluid collection along the lateral portion of right hepatic lobe which appears to contain hemorrhage and/or purulent material. Interval development of mild right pleural effusion. Enlarged fibroid uterus. Mild anasarca. Electronically Signed   By: Marijo Conception, M.D.   On: 01/21/2018 07:13        Scheduled Meds: . sodium chloride   Intravenous Once  . potassium chloride  10 mEq Oral Daily  . sodium chloride flush  5 mL Intracatheter Q8H   Continuous Infusions: . cefTRIAXone (ROCEPHIN)  IV 2 g (01/21/18 1054)     LOS: 12 days    Time spent: 25 minutes    Edwin Dada, MD [facility] 01/21/2018, 11:42 AM     Pager [phone] --- please page though AMION:  [email or web address] If 7PM-7AM, please contact night-coverage

## 2018-01-21 NOTE — Progress Notes (Signed)
Patient ID: Lisa Bauer, female   DOB: 29-Feb-1956, 62 y.o.   MRN: [id number]         [facility]  Date of Admission:  01/09/2018    Total days of antibiotics 13        Day 9 ceftriaxone         ASSESSMENT: She has a large multiloculated, polymicrobial liver abscess.  Admission blood cultures grew viridans strep and her abscess grew group F strep.  I suspect that the change in her CT scan is related to hematoma caused by the drain placement and natural evolution of her abscess rather than true worsening.  Clinically she has much improved.  Her acute renal insufficiency is resolving.  Comfortable discharging her at any time.  I would recommend extending IV ceftriaxone into early September when I will arrange for her to be seen and our clinic.  PLAN: 1. Okay for discharge on IV ceftriaxone. 2. She needs follow-up in interventional radiology's drain clinic  OPAT Orders Discharge antibiotics: ceftriaxone 2g ivpb q24h  Duration: end 02/06/2018   [facility] Care Per Protocol: please  Labs weekly while on IV antibiotics: _x_ CBC with differential __ BMP _x_ CMP __ CRP __ ESR __ Vancomycin trough  __ Please pull PIC at completion of IV antibiotics _x_ Please leave PIC in place until doctor has seen patient or been notified  Fax weekly labs to [phone]  Clinic Follow Up Appt: 2 weeks  Principal Problem:   Hepatic abscess Active Problems:   Bacteremia due to Streptococcus   ARF (acute renal failure) (HCC)   Severe sepsis with septic shock (HCC)   Elevated liver function tests   Anemia   Scheduled Meds: . sodium chloride   Intravenous Once  . potassium chloride  10 mEq Oral Daily  . sodium chloride flush  5 mL Intracatheter Q8H   Continuous Infusions: . cefTRIAXone (ROCEPHIN)  IV Stopped (01/21/18 1124)   PRN Meds:.acetaminophen, HYDROmorphone (DILAUDID) injection, ondansetron (ZOFRAN) IV, oxyCODONE, sodium chloride  flush   SUBJECTIVE: Dr. Loleta Books asked me to reevaluate Lisa Bauer after repeat CT scan showed some hemorrhage in her largest liver abscess cavity and some increase in size of the more lateral and posterior abscess.  Lisa Bauer states that she is feeling much better.  Her abdominal pain and bloating have improved.  Her appetite has improved.  Her acute renal insufficiency is resolving.  She has been up walking in the hall.  Review of Systems: Review of Systems  Constitutional: Positive for malaise/fatigue. Negative for chills, diaphoresis and fever.  Respiratory: Negative for cough.   Cardiovascular: Negative for chest pain.  Gastrointestinal: Positive for abdominal pain. Negative for constipation, diarrhea, nausea and vomiting.       She still feels bloated.    No Known Allergies  OBJECTIVE: Vitals:   01/20/18 1950 01/20/18 2050 01/21/18 0235 01/21/18 0555  BP: (!) 168/83 (!) 157/75 (!) 168/85   Pulse: 79 78 79   Resp:  18    Temp: 99 F (37.2 C) 98.7 F (37.1 C) 99 F (37.2 C)   TempSrc: Oral Oral Oral   SpO2: 98% 100% 98%   Weight:    97.3 kg  Height:       Body mass index is 29.09 kg/m.  Physical Exam  Constitutional: She is oriented to person, place, and time.  She is in good spirits.  She is sitting up in a chair finishing her lunch.  She is visiting with  her daughter.  Cardiovascular: Normal rate, regular rhythm and normal heart sounds.  No murmur heard. Pulmonary/Chest: Effort normal and breath sounds normal.  Abdominal: Soft.  She has mild right upper quadrant tenderness with palpation.  There is a drain in her right flank.  There is thin bloody drainage in the drain bulb.  Neurological: She is alert and oriented to person, place, and time.  Skin:  Right arm PICC site looks good.  Psychiatric: She has a normal mood and affect.    Lab Results Lab Results  Component Value Date   WBC 22.4 (H) 01/21/2018   HGB 8.3 (L) 01/21/2018   HCT 25.2 (L) 01/21/2018    MCV 87.5 01/21/2018   PLT 389 01/21/2018    Lab Results  Component Value Date   CREATININE 1.59 (H) 01/21/2018   BUN 28 (H) 01/21/2018   NA 142 01/21/2018   K 3.9 01/21/2018   CL 106 01/21/2018   CO2 28 01/21/2018    Lab Results  Component Value Date   ALT 335 (H) 01/15/2018   AST 150 (H) 01/15/2018   ALKPHOS 259 (H) 01/15/2018   BILITOT 1.5 (H) 01/15/2018     Microbiology: Recent Results (from the past 240 hour(s))  Aerobic/Anaerobic Culture (surgical/deep wound)     Status: None   Collection Time: 01/11/18  2:27 PM  Result Value Ref Range Status   Specimen Description ABSCESS  Final   Special Requests Normal  Final   Gram Stain   Final    MODERATE WBC PRESENT, PREDOMINANTLY PMN MODERATE GRAM POSITIVE COCCI    Culture   Final    FEW STREPTOCOCCUS GROUP F NO ANAEROBES ISOLATED Performed at [facility], [place] [address], [place], [place] [address]    Report Status 01/16/2018 FINAL  Final  Culture, blood (routine x 2)     Status: None   Collection Time: 01/11/18  9:29 PM  Result Value Ref Range Status   Specimen Description BLOOD BLOOD LEFT FOREARM  Final   Special Requests   Final    BOTTLES DRAWN AEROBIC ONLY Blood Culture results may not be optimal due to an inadequate volume of blood received in culture bottles   Culture   Final    NO GROWTH 5 DAYS Performed at [facility], [place] [address] [place],  [address]    Report Status 01/16/2018 FINAL  Final    Michel Bickers, MD [facility] [phone] pager   [phone] cell 01/21/2018, 2:15 PM

## 2018-01-21 NOTE — Progress Notes (Signed)
PHARMACY CONSULT NOTE FOR:  OUTPATIENT  PARENTERAL ANTIBIOTIC THERAPY (OPAT)  Indication: Liver Abscess/Bacteremia Regimen: Ceftriaxone 2 gm every 24 hours  End date: 02/06/18  IV antibiotic discharge orders are pended. To discharging provider:  please sign these orders via discharge navigator,  Select New Orders & click on the button choice - Manage This Unsigned Work.     Thank you for allowing pharmacy to be a part of this patient's care.  Susa Raring, PharmD, BCPS Infectious Diseases Clinical Pharmacist Phone: [phone] 01/21/2018, 3:03 PM

## 2018-01-21 NOTE — Progress Notes (Signed)
Referring Physician(s): Rockwell Alexandria  Supervising Physician: Marybelle Killings  Patient Status:   [facility] - In-pt  Chief Complaint: Follow up liver abscess drain  Subjective:  62 y/o F with PMH significant for HTN presented to [facility] ED on 8/7 with complaints of fatigue, body aches, abdominal pain and n/v. She was found to be febrile with lactic acid 3.05, however WBC 9.9 at that time. She was admitted for sepsis workup. Initial blood cultures positive for strep.   CT abdomen/pelvis obtained on 8/7 which showed heterogeneously attenuating mass within liver, it was unclear if this area was hepatic mass vs abscess. MR abdomen w/o contrast was recommended by Dr. Anselm Pancoast which showed heterogenous mass involving almost entire segment 4 of liver and smaller lesion within lateral aspect of segment 7; this was thought to be compatible with primary neoplasm of metastatic disease. A biopsy of the larger lesion was performed by Dr. Kathlene Cote on 8/9 with drain placement. Pathology shows this was consistent with abscess. WBC was initial normal however continued to trend upward with max 39.0 on 8/13. She continues on IV abx per ID, WBC continues to trend down.Patient was noted to have H/H which began to trend down on 8/7 eventually requiring transfusion of 2 units PRBC on 8/18 due to hemoglobin of 6.7 without evidence of bleeding.  Drain output continued to trend downward and repeat CT abdomen/pelvis was performed on 8/19 which showed that the abscess was enlarged compared with previous study and there was now hemorrhage within the abscess. Additionally, moderate sized subcapsular fluid collection along the lateral portion of right hepatic lobe was noted which appeared to contain hemorrhage and/or purulent material.   Patient reports that she continues to have early satiety with some RUQ pain and "fullness." She denies n/v, diarrhea or flatus. She does report dark color to her stool which she states she did not mention to the  hospitalist previously.   Allergies: Patient has no known allergies.  Medications: Prior to Admission medications   Medication Sig Start Date End Date Taking? Authorizing Provider  cholecalciferol (VITAMIN D) 1000 units tablet Take 1,000 Units by mouth daily.   Yes [provider]  lisinopril-hydrochlorothiazide (PRINZIDE,ZESTORETIC) 20-25 MG tablet Take 1 tablet by mouth at bedtime.  07/14/16  Yes [provider]  ondansetron (ZOFRAN-ODT) 8 MG disintegrating tablet Take 8 mg by mouth every 8 (eight) hours as needed for nausea or vomiting.   Yes [provider]  phenazopyridine (PYRIDIUM) 200 MG tablet Take 200 mg by mouth 3 (three) times daily as needed for pain.   Yes [provider]  TURMERIC PO Take 2 capsules by mouth at bedtime.   Yes [provider]  cefTRIAXone (ROCEPHIN) IVPB Inject 2 g into the vein daily for 16 days. Indication:  Hepatic abscess/bacteremia Last Day of Therapy:  8/28.19 Labs - Once weekly:  CBC/D and BMP, Labs - Every other week:  ESR and CRP 01/19/18 02/04/18  Danford, Suann Larry, MD     Vital Signs: BP (!) 168/85 (BP Location: Left Arm)   Pulse 79   Temp 99 F (37.2 C) (Oral)   Resp 18   Ht 6' (1.829 m)   Wt 214 lb 8.1 oz (97.3 kg) Comment: scale a  LMP  (LMP Unknown)   SpO2 98%   BMI 29.09 kg/m   Physical Exam  Constitutional: She is oriented to person, place, and time. No distress.  HENT:  Head: Normocephalic.  Cardiovascular: Normal rate, regular rhythm and normal heart sounds.  Pulmonary/Chest: Effort normal.  Abdominal: Soft. There is tenderness (RUQ).  ~ 10 cc bloody drainage in suction bulb.  Neurological: She is alert and oriented to person, place, and time.  Skin: Skin is warm and dry. She is not diaphoretic.  Psychiatric: She has a normal mood and affect. Her behavior is normal. Judgment and thought content normal.  Nursing note and vitals reviewed.   Imaging: Ct Abdomen Pelvis Wo  Contrast  Result Date: 01/21/2018 CLINICAL DATA:  Liver abscess. EXAM: CT ABDOMEN AND PELVIS WITHOUT CONTRAST TECHNIQUE: Multidetector CT imaging of the abdomen and pelvis was performed following the standard protocol without IV contrast. COMPARISON:  CT scan of January 09, 2018.  MRI of January 11, 2018. FINDINGS: Lower chest: Mild right pleural effusion is noted. Hepatobiliary: There is been interval placement of drainage catheter within large low density seen anteriorly in the liver. High density material is seen within this low density currently, suggesting hemorrhage. Overall, the low density is in large compared to prior exam, currently measuring 12.1 by 8.8 cm. There is interval development of subcapsular low density along the lateral portion of right hepatic lobe concerning for hematoma and/or purulent material. Stable 4.4 cm low density is noted posteriorly in right hepatic lobe. Pancreas: Unremarkable. No pancreatic ductal dilatation or surrounding inflammatory changes. Spleen: Normal in size without focal abnormality. Adrenals/Urinary Tract: Adrenal glands are unremarkable. Kidneys are normal, without renal calculi, focal lesion, or hydronephrosis. Bladder is unremarkable. Stomach/Bowel: Stomach is within normal limits. Appendix appears normal. No evidence of bowel wall thickening, distention, or inflammatory changes. Vascular/Lymphatic: No significant vascular findings are present. No enlarged abdominal or pelvic lymph nodes. Reproductive: Enlarged fibroid uterus is again noted. Other: Mild anasarca is noted.  No hernia is noted. Musculoskeletal: No acute or significant osseous findings. IMPRESSION: Interval placement of percutaneous drainage catheter into abscess noted anteriorly in the liver, with interval development of hemorrhage within the abscess. The abscess is enlarged compared to prior exam, now measuring 12.1 x 8.8 cm. There is interval development of moderate size subcapsular fluid collection  along the lateral portion of right hepatic lobe which appears to contain hemorrhage and/or purulent material. Interval development of mild right pleural effusion. Enlarged fibroid uterus. Mild anasarca. Electronically Signed   By: Marijo Conception, M.D.   On: 01/21/2018 07:13    Labs:  CBC: Recent Labs    01/18/18 0745 01/19/18 0641 01/20/18 0445 01/20/18 1442 01/21/18 0230  WBC 19.6* 27.1* 25.6*  --  22.4*  HGB 11.0* 7.2* 6.7* 7.6* 8.3*  HCT 30.5* 21.1* 19.9* 22.9* 25.2*  PLT 158 287 352  --  389    COAGS: Recent Labs    01/09/18 1151  INR 1.24    BMP: Recent Labs    01/18/18 0745 01/19/18 0641 01/20/18 0445 01/21/18 0230  NA 139 141 144 142  K 3.2* 3.5 4.2 3.9  CL 104 104 108 106  CO2 _0 GLUCOSE 120* 107* 126* 100*  BUN 69* 51* 40* 28*  CALCIUM 7.3* 7.5* 7.5* 7.5*  CREATININE 3.26* 2.37* 2.03* 1.59*  GFRNONAA 14* 21* 25* 34*  GFRAA 16* 24* 29* 39*    LIVER FUNCTION TESTS: Recent Labs    01/12/18 0258 01/13/18 0400 01/14/18 0350 01/15/18 0500  01/18/18 0745 01/19/18 0641 01/20/18 0445 01/21/18 0230  BILITOT 3.2* 2.0* 1.9* 1.5*  --   --   --   --   --   AST 1,080* 554* 260* 150*  --   --   --   --   --  ALT 659* 549* 466* 335*  --   --   --   --   --   ALKPHOS 114 148* 193* 259*  --   --   --   --   --   PROT 4.7* 4.6* 5.2* 5.5*  --   --   --   --   --   ALBUMIN 2.1* 1.8* 1.8* 1.8*   < > 1.9* 1.9* 1.9* 2.0*   < > = values in this interval not displayed.    Assessment and Plan:  Follow-up liver abscess drain placed on 8/9 by Dr. Yamagata - repeat CT scan from 8/19 shows hemorrhage of abscess where IR drain is located as well as moderate subcapsular fluid collection along the lateral portion of the right hepatic lobe consistent with hemorrhage and/or purulent material. Minimal bloody drainage noted in suction bulb on exam today. Patient previously with hemoglobin 6.7 yesterday requiring 2 units PRBC although no clinical signs of bleeding -  however she does report melena to me today. FOBT pending. WBC and LFTs trending down from admission, afebrile, per ID will likely require 3 weeks IV abx.  IR unable to place second drain in smaller fluid collection as there is a high risk of worsening bleeding with further drain placement at this time. If liver hemorrhage continues to worsen surgical intervention may be required. IR to continue to follow drain while patient remains in house, once patient is to be d/ced IR to schedule patient for follow up in outpatient drain clinic 1-2 weeks after discharge for drain injection/repeat CT.  Continue routine drain care with 3-5 mL saline flushes BID, record output. Discharge per admitting service.  Electronically Signed: Shannon A Watterson, PA-C 01/21/2018, 2:32 PM   I spent a total of 25 Minutes at the the patient's bedside AND on the patient's hospital floor or unit, greater than 50% of which was counseling/coordinating care for liver abscess drain. 

## 2018-01-22 DIAGNOSIS — K7689 Other specified diseases of liver: Secondary | ICD-10-CM

## 2018-01-22 LAB — CBC
HCT: 22.9 % — ABNORMAL LOW (ref 36.0–46.0)
Hemoglobin: 7.4 g/dL — ABNORMAL LOW (ref 12.0–15.0)
MCH: 28.6 pg (ref 26.0–34.0)
MCHC: 32.3 g/dL (ref 30.0–36.0)
MCV: 88.4 fL (ref 78.0–100.0)
PLATELETS: 415 10*3/uL — AB (ref 150–400)
RBC: 2.59 MIL/uL — ABNORMAL LOW (ref 3.87–5.11)
RDW: 16.4 % — AB (ref 11.5–15.5)
WBC: 17.6 10*3/uL — AB (ref 4.0–10.5)

## 2018-01-22 LAB — RENAL FUNCTION PANEL
Albumin: 1.8 g/dL — ABNORMAL LOW (ref 3.5–5.0)
Anion gap: 3 — ABNORMAL LOW (ref 5–15)
BUN: 21 mg/dL (ref 8–23)
CHLORIDE: 108 mmol/L (ref 98–111)
CO2: 29 mmol/L (ref 22–32)
CREATININE: 1.56 mg/dL — AB (ref 0.44–1.00)
Calcium: 7 mg/dL — ABNORMAL LOW (ref 8.9–10.3)
GFR calc Af Amer: 40 mL/min — ABNORMAL LOW (ref >60)
GFR, EST NON AFRICAN AMERICAN: 35 mL/min — AB (ref >60)
Glucose, Bld: 93 mg/dL (ref 70–99)
Phosphorus: 3.1 mg/dL (ref 2.5–4.6)
Potassium: 3.6 mmol/L (ref 3.5–5.1)
Sodium: 140 mmol/L (ref 135–145)

## 2018-01-22 LAB — OCCULT BLOOD X 1 CARD TO LAB, STOOL: Fecal Occult Bld: POSITIVE — AB

## 2018-01-22 NOTE — Consult Note (Signed)
[facility] Consult  Referring Provider: Myrene Buddy, MD Primary Care Physician:  Wenda Low, MD Primary Gastroenterologist: [facility] Reason for Consultation:  Liver abscess, anemia, FOBT positive stool  HPI: Lisa Bauer is a 62 y.o. female was admitted on 01/09/18 with abdominal pain, fever, hypotension related to a large liver abscess,thrombocytopenia and renal impairment.she underwent ultrasound-guided biopsy of liver and catheter drainage of a large, heterogeneous necrotic mass versus liver abscess. Thrombocytopenia has resolved,leukocytosis is improving,renal impairment is improving,however, she continues to be anemic,hemoglobin 5.7 on 01/18/18 and 7.4 today. She needed 2 units of PRBC on 01/20/18 and 2 units of platelets on 01/10/18.  Patient reports having a colonoscopy 10 years ago. Normally she has regular bowel movements and denies seeing blood in stool or black stools. Except for recent episode of acute illness with nausea and vomiting along with fever at presentation, she denies other GI symptoms such as acid reflux, difficulty swallowing, pain on swallowing, early satiety or bloating.  Patient denies recent travel or sick contacts.She denies high risk bleeding such as IV drug abuse.  Repeat CAT scan showed that abscess was enlarging with hemorrhage with an abscess along with a moderate-sized subcapsular fluid collection along the lateral portion of right hepatic lobe containing blood and PURULENT material.    Past Medical History:  Diagnosis Date  . Hypertension     History reviewed. No pertinent surgical history.  Prior to Admission medications   Medication Sig Start Date End Date Taking? Authorizing Provider  cholecalciferol (VITAMIN D) 1000 units tablet Take 1,000 Units by mouth daily.   Yes [provider]  lisinopril-hydrochlorothiazide (PRINZIDE,ZESTORETIC) 20-25 MG tablet Take 1 tablet by mouth at bedtime.  07/14/16  Yes [provider]  ondansetron (ZOFRAN-ODT) 8 MG disintegrating tablet Take 8 mg by mouth every 8 (eight) hours as needed for nausea or vomiting.   Yes [provider]  phenazopyridine (PYRIDIUM) 200 MG tablet Take 200 mg by mouth 3 (three) times daily as needed for pain.   Yes [provider]  TURMERIC PO Take 2 capsules by mouth at bedtime.   Yes [provider]  cefTRIAXone (ROCEPHIN) IVPB Inject 2 g into the vein daily for 16 days. Indication:  Hepatic abscess/bacteremia Last Day of Therapy:  02/06/18 Labs - Once weekly:  CBC/D and BMP, Labs - Every other week:  ESR and CRP 01/21/18 02/06/18  Danford, Suann Larry, MD    Current Facility-Administered Medications  Medication Dose Route Frequency Provider Last Rate Last Dose  . 0.9 %  sodium chloride infusion (Manually program via Guardrails IV Fluids)   Intravenous Once Blount, Scarlette Shorts T, NP      . acetaminophen (TYLENOL) tablet 650 mg  650 mg Oral Q6H PRN Hosie Poisson, MD   650 mg at 01/11/18 1517  . cefTRIAXone (ROCEPHIN) 2 g in sodium chloride 0.9 % 100 mL IVPB  2 g Intravenous Q24H Susa Raring, [place]   Stopped at 01/21/18 1124  . hydrALAZINE (APRESOLINE) tablet 25 mg  25 mg Oral Q8H Danford, Suann Larry, MD   25 mg at 01/22/18 0533  . HYDROmorphone (DILAUDID) injection 1 mg  1 mg Intravenous Q4H PRN Robbie Lis, MD   1 mg at 01/20/18 2107  . ondansetron (ZOFRAN) injection 4 mg  4 mg Intravenous Q6H PRN Hosie Poisson, MD   4 mg at 01/09/18 1917  . oxyCODONE (Oxy IR/ROXICODONE) immediate release tablet 2.5-5 mg  2.5-5 mg Oral QHS PRN Danford, Suann Larry, MD   2.5  mg at 01/22/18 0305  . oxyCODONE (Oxy IR/ROXICODONE) immediate release tablet 5 mg  5 mg Oral Q4H PRN Robbie Lis, MD   5 mg at 01/22/18 1610  . potassium chloride (K-DUR,KLOR-CON) CR tablet 10 mEq  10 mEq Oral Daily Edwin Dada, MD   10 mEq at 01/22/18 0839  . sodium chloride flush (NS) 0.9 % injection 10-40 mL  10-40 mL Intracatheter  PRN Hosie Poisson, MD   20 mL at 01/20/18 2108  . sodium chloride flush (NS) 0.9 % injection 5 mL  5 mL Intracatheter Q8H Hosie Poisson, MD   5 mL at 01/21/18 1435    Allergies as of 01/09/2018  . (No Known Allergies)    History reviewed. No pertinent family history.  Social History   Socioeconomic History  . Marital status: Married    Spouse name: Not on file  . Number of children: Not on file  . Years of education: Not on file  . Highest education level: Not on file  Occupational History  . Not on file  Social Needs  . Financial resource strain: Not on file  . Food insecurity:    Worry: Not on file    Inability: Not on file  . Transportation needs:    Medical: Not on file    Non-medical: Not on file  Tobacco Use  . Smoking status: Never Smoker  . Smokeless tobacco: Never Used  Substance and Sexual Activity  . Alcohol use: No  . Drug use: No  . Sexual activity: Not on file  Lifestyle  . Physical activity:    Days per week: Not on file    Minutes per session: Not on file  . Stress: Not on file  Relationships  . Social connections:    Talks on phone: Not on file    Gets together: Not on file    Attends religious service: Not on file    Active member of club or organization: Not on file    Attends meetings of clubs or organizations: Not on file    Relationship status: Not on file  . Intimate partner violence:    Fear of current or ex partner: Not on file    Emotionally abused: Not on file    Physically abused: Not on file    Forced sexual activity: Not on file  Other Topics Concern  . Not on file  Social History Narrative  . Not on file    Review of Systems: Positive for: GI: Described in detail in HPI.    Gen:  fever, chills, rigors,anorexia, fatigue, weakness, malaise, Denies any night sweats, , involuntary weight loss, and sleep disorder CV: Denies chest pain, angina, palpitations, syncope, orthopnea, PND, peripheral edema, and claudication. Resp:  Denies dyspnea, cough, sputum, wheezing, coughing up blood. GU :  urinary burning, Denies blood in urine, urinary frequency, urinary hesitancy, nocturnal urination, and urinary incontinence. MS: Denies joint pain or swelling.  Denies muscle weakness, cramps, atrophy.  Derm: Denies rash, itching, oral ulcerations, hives, unhealing ulcers.  Psych: Denies depression, anxiety, memory loss, suicidal ideation, hallucinations,  and confusion. Heme: Denies bruising, bleeding, and enlarged lymph nodes. Neuro:  Denies any headaches, dizziness, paresthesias. Endo:  Denies any problems with DM, thyroid, adrenal function.  Physical Exam: Vital signs in last 24 hours: Temp:  [98 F (36.7 C)] 98 F (36.7 C) (08/19 2141) Pulse Rate:  [57-78] 62 (08/20 0533) Resp:  [18] 18 (08/19 2141) BP: (156-176)/(84-90) 159/90 (08/20 0533) SpO2:  [93 %-97 %]  93 % (08/19 2141) Last BM Date: 01/21/18  General:   Alert,  Well-developed, well-nourished, pleasant and cooperative in NAD Head:  Normocephalic and atraumatic. Eyes:  Sclera clear, no icterus.  Prominent pallor Ears:  Normal auditory acuity. Nose:  No deformity, discharge,  or lesions. Mouth:  No deformity or lesions.  Oropharynx pink & moist. Neck:  Supple; no masses or thyromegaly. Lungs:  Clear throughout to auscultation.   No wheezes, crackles, or rhonchi. No acute distress. Heart:  Regular rate and rhythm; no murmurs, clicks, rubs,  or gallops. Extremities:  Without clubbing or edema. Neurologic:  Alert and  oriented x4;  grossly normal neurologically. Skin:  Intact without significant lesions or rashes. Psych:  Alert and cooperative. Normal mood and affect. Abdomen:  Soft, nontender and nondistended. No masses, hepatosplenomegaly or hernias noted. Normal bowel sounds, without guarding, and without rebound.     Drainage catheter in the right upper quadrant minimal amount of dark brown fluid     Lab Results: Recent Labs    01/20/18 0445  01/20/18 1442 01/21/18 0230 01/22/18 0500  WBC 25.6*  --  22.4* 17.6*  HGB 6.7* 7.6* 8.3* 7.4*  HCT 19.9* 22.9* 25.2* 22.9*  PLT 352  --  389 415*   BMET Recent Labs    01/20/18 0445 01/21/18 0230 01/22/18 0500  NA 144 142 140  K 4.2 3.9 3.6  CL 108 106 108  CO2 _0 GLUCOSE 126* 100* 93  BUN 40* 28* 21  CREATININE 2.03* 1.59* 1.56*  CALCIUM 7.5* 7.5* 7.0*   LFT Recent Labs    01/22/18 0500  ALBUMIN 1.8*   PT/INR No results for input(s): LABPROT, INR in the last 72 hours.  Studies/Results: Ct Abdomen Pelvis Wo Contrast  Result Date: 01/21/2018 CLINICAL DATA:  Liver abscess. EXAM: CT ABDOMEN AND PELVIS WITHOUT CONTRAST TECHNIQUE: Multidetector CT imaging of the abdomen and pelvis was performed following the standard protocol without IV contrast. COMPARISON:  CT scan of January 09, 2018.  MRI of January 11, 2018. FINDINGS: Lower chest: Mild right pleural effusion is noted. Hepatobiliary: There is been interval placement of drainage catheter within large low density seen anteriorly in the liver. High density material is seen within this low density currently, suggesting hemorrhage. Overall, the low density is in large compared to prior exam, currently measuring 12.1 by 8.8 cm. There is interval development of subcapsular low density along the lateral portion of right hepatic lobe concerning for hematoma and/or purulent material. Stable 4.4 cm low density is noted posteriorly in right hepatic lobe. Pancreas: Unremarkable. No pancreatic ductal dilatation or surrounding inflammatory changes. Spleen: Normal in size without focal abnormality. Adrenals/Urinary Tract: Adrenal glands are unremarkable. Kidneys are normal, without renal calculi, focal lesion, or hydronephrosis. Bladder is unremarkable. Stomach/Bowel: Stomach is within normal limits. Appendix appears normal. No evidence of bowel wall thickening, distention, or inflammatory changes. Vascular/Lymphatic: No significant vascular  findings are present. No enlarged abdominal or pelvic lymph nodes. Reproductive: Enlarged fibroid uterus is again noted. Other: Mild anasarca is noted.  No hernia is noted. Musculoskeletal: No acute or significant osseous findings. IMPRESSION: Interval placement of percutaneous drainage catheter into abscess noted anteriorly in the liver, with interval development of hemorrhage within the abscess. The abscess is enlarged compared to prior exam, now measuring 12.1 x 8.8 cm. There is interval development of moderate size subcapsular fluid collection along the lateral portion of right hepatic lobe which appears to contain hemorrhage and/or purulent material. Interval development of mild  right pleural effusion. Enlarged fibroid uterus. Mild anasarca. Electronically Signed   By: Marijo Conception, M.D.   On: 01/21/2018 07:13    Impression: 1.large liver abscess, now measuring 12.18.8 cm,Large multiloculated, polymicrobial liver abscess. Admission blood cultures grew viridans strep and her abscess grew group F strep  2. Moderate-sized subcapsular fluid collection likely related to hemorrhage  3.Anemia, FOBT positive stool   Plan: 1.Patient to be discharged on IV ceftriaxone as per recommendations from ID and to follow up with interventional radiology in about 2 weeks reimaging. 2. Drop in hemoglobin likely related to hemorrhage within the liver,hemodynamically stable 3.FOBT positive stool, plan colonoscopy as an outpatient in about 6 weeks. Discussed with patient regarding inpatient versus outpatient colonoscopy, the risk and benefits of the procedure. The patient understands and verbalizes consent. She wants to wait for a few weeks to recuperate and would want to schedule an outpatient colonoscopy. Discussed with patient's hospitalist Dr. Loleta Books.   LOS: 13 days   Ronnette Juniper, MD  01/22/2018, 9:20 AM  Pager [phone] If no answer or after 5 PM call [phone]

## 2018-01-22 NOTE — Progress Notes (Signed)
Patient ID: Lisa Bauer, female   DOB: 1955/06/19, 62 y.o.   MRN: [id number]         [facility] for Infectious Disease  Date of Admission:  01/09/2018    Total days of antibiotics 14        Day 10 ceftriaxone         ASSESSMENT: She is having slow clinical improvement.  Drain output may be tailing off and the decision was made by IR to not place a second drain because of her bleeding.   PLAN: 1. Continue ceftriaxone  Principal Problem:   Hepatic abscess Active Problems:   Bacteremia due to Streptococcus   ARF (acute renal failure) (HCC)   Severe sepsis with septic shock (HCC)   Elevated liver function tests   Anemia   Scheduled Meds: . sodium chloride   Intravenous Once  . hydrALAZINE  25 mg Oral Q8H  . potassium chloride  10 mEq Oral Daily  . sodium chloride flush  5 mL Intracatheter Q8H   Continuous Infusions: . cefTRIAXone (ROCEPHIN)  IV 2 g (01/22/18 1155)   PRN Meds:.acetaminophen, HYDROmorphone (DILAUDID) injection, ondansetron (ZOFRAN) IV, oxyCODONE, oxyCODONE, sodium chloride flush   SUBJECTIVE: She is still requiring narcotic pain medication for her right upper quadrant pain but she is feeling better.  She has been up walking in the hallway.  Review of Systems: Review of Systems  Constitutional: Positive for malaise/fatigue. Negative for chills, diaphoresis and fever.  Gastrointestinal: Positive for abdominal pain. Negative for diarrhea, nausea and vomiting.    No Known Allergies  OBJECTIVE: Vitals:   01/21/18 0555 01/21/18 1745 01/21/18 2141 01/22/18 0533  BP:  (!) 176/88 (!) 156/84 (!) 159/90  Pulse:  78 (!) 57 62  Resp:   18   Temp:  98 F (36.7 C) 98 F (36.7 C)   TempSrc:  Oral Oral   SpO2:  97% 93%   Weight: 97.3 kg     Height:       Body mass index is 29.09 kg/m.  Physical Exam  Constitutional:  She is in good spirits.  She is sitting up in a chair visiting with her son.  Abdominal: Soft. She exhibits distension. There is  tenderness.  Her 10 cc of recorded output from her hepatic drain yesterday.  She has about 5 cc of bloody fluid in the drain bulb currently.    Lab Results Lab Results  Component Value Date   WBC 17.6 (H) 01/22/2018   HGB 7.4 (L) 01/22/2018   HCT 22.9 (L) 01/22/2018   MCV 88.4 01/22/2018   PLT 415 (H) 01/22/2018    Lab Results  Component Value Date   CREATININE 1.56 (H) 01/22/2018   BUN 21 01/22/2018   NA 140 01/22/2018   K 3.6 01/22/2018   CL 108 01/22/2018   CO2 29 01/22/2018    Lab Results  Component Value Date   ALT 335 (H) 01/15/2018   AST 150 (H) 01/15/2018   ALKPHOS 259 (H) 01/15/2018   BILITOT 1.5 (H) 01/15/2018     Microbiology: No results found for this or any previous visit (from the past 240 hour(s)).  Michel Bickers, MD [facility] [place] [facility] [phone] pager   [phone] cell 01/22/2018, 1:25 PM

## 2018-01-22 NOTE — Progress Notes (Signed)
PROGRESS NOTE    TRESSIE RAGIN  OHY:[id number] DOB: 08-09-1955 DOA: 01/09/2018 PCP: Wenda Low, MD      Brief Narrative:  Mrs. Sirmon is a 62 y.o. F with diet-controlled HTN who presented with fever, sepsis syndrome and abdominal pain --> started on empiric broad-spectrum antibiotics, admitted to ICU on pressors.  Found to have liver abscess, thrombocytopenia, septic shock, renal failure.       Assessment & Plan:  Septic shock from liver abscess Afebrile since drain placed and WBC trending down since peak 37K on 8/12.  Repeat CT showed interval slight enlargement of main abscess, as well as small posterior lesion.  Discussed with Dr. Lenox Ahr and Dr. Megan Salon of IR and ID, respectively.  From ID standpoint, patient afebrile and WBC trending down.  Given short interval enlargement on CT, ID favor that she will need longer IV treatment that initially planned, but this will be at their discretion as an outpatient.  Drain output has dwindled to 10-30 cc/day. -Continue ceftraixone daily -Appreciate ID and IR involvement   Liver hematoma Largest drop in Hgb occurred actually at admission and then in the days surrounding the urgent drain placement (at that time, note, platelets hit nadir <50K).  Disregard spurious Hgbs on 8/16.  Since 8/12, Hgb was gradually drifting down until 8/18, <7g/dL, given 2 units transfusion, appropriate bump.  Today, trending down again.  Acute blood loss anemia, with superimposed critical illness anemia Hgb loss into liver hematoma, as well as superimposed bone marrow suppression. Iron replete. Got Feraheme 8/16.  Transfused 2 units 8/18, appropriate bump.  Hard to know how much ongoing blood loss into her liver abscess/hematoma she has. I favor that the majority of bleeding happened when her platelets were low and that there is minimal ongoing bleeding, just that we are seeing trend down due to bone marrow suppression, maybe phlebotomy. There is FOBT, but no  frank melena or hematochezia so I doubt GI blood loss, although I will discuss with GI.   -Trend CBC -Consult GI, appreciate expert guidance  Acute renal failure Creatinine down to 1.56 mg/dL today, doing well.  Good UOP. -Trend Hgb -Avoid HCTZ or ACEi/ARB until Cr stabilizes  SVT There was a brief episode of SVT on monitors while patient was hypokalemic.  This was 16 beats, asymptomatic.   -Keep K>4, mag>2  Thrombocytopenia Resolved.  Did receive 2 units PRBCs.  Liver mass Biopsy negative for cancer.     DVT prophylaxis: SCDs Code Status: FULL Family Communication: Son at the bedside MDM and disposition Plan: The below labs and imaging reports reviewed and summarized above.  Medication management as above.  The patient was admitted with septic shock from a liver abscess.  This was aspirated on 8/9, and has been on ceftriaxone for group F strep from her abscess and group D strep in her blood.  Her thrombocytopenia and multiorgan failure are mostly resolved.  Her renal function has not been improving steadily.  Nephrology has signed off.  Over the weekend she required 2 units of blood transfusion for continued blood loss, from what now appears to be a hematoma/blood collection in her liver abscess.  IR have reevaluated the patient, recommending continue current therapy.    From the kidney standpoint, she is safe for discharge, from the infectious disease standpoint, her ceftriaxone has been arranged for home health infusion.  IR have arranged for follow-up which will be scheduled at the time she is discharged.  Infectious disease will be responsible for scheduling  follow-up in their office.  However at this point, I believe it is unsafe to discharge the patient until hemoglobin has stabilized more more confident she will not require ongoing transfusions, and does not have active blood loss.  I discussed with the patient that I would like to see a trend of improvement in her  hemoglobin.          Consultants:   Nephrology  Interventional radiology  Critical care  Procedures:   Liver biopsy and catheter drainage 8/9  Antimicrobials:   Flagyl x1 8/7  Vancomycin x1 8/7  Zosyn 8/7 >> 8/11  Ceftriaxone 8/12 >>    Culture data:   8/7 admission Blood cultures x2: 2/2 viridans strep  8/9 blood culture repeat x1: NGTD  8/9 abscess aspirate: Group F strep       Subjective: The patient has moderate pain, waxing and waning, worse with evening time or sitting still in her right upper quadrant.  She has no melena, no hematochezia.  She has no fever, vomiting.  She has no confusion.  No dyspnea.  No dizziness with standing, loss of consciousness.  Objective: Vitals:   01/21/18 0555 01/21/18 1745 01/21/18 2141 01/22/18 0533  BP:  (!) 176/88 (!) 156/84 (!) 159/90  Pulse:  78 (!) 57 62  Resp:   18   Temp:  98 F (36.7 C) 98 F (36.7 C)   TempSrc:  Oral Oral   SpO2:  97% 93%   Weight: 97.3 kg     Height:        Intake/Output Summary (Last 24 hours) at 01/22/2018 0940 Last data filed at 01/22/2018 0600 Gross per 24 hour  Intake 230 ml  Output 10 ml  Net 220 ml   Filed Weights   01/19/18 0616 01/20/18 0439 01/21/18 0555  Weight: 96.6 kg 96.6 kg 97.3 kg    Examination: General appearance: Adult female, slow gait, no acute distress, interactive. HEENT: Anicteric, conjunctive are pink, lids and lashes normal.  No nasal deformity, discharge, or epistaxis.  Lips moist, teeth normal, hearing normal. Skin: Warm and dry, mild pallor. Cardiac: Regular rate and rhythm, no tachycardia, no murmur.  Moderate pitting edema. Respiratory: Normal respiratory rate and rhythm, lungs clear without rales or wheezes. Abdomen: Abdomen soft without tenderness to palpation, ascites, or distention.  MSK: No deformities or effusions of the large joints of the upper or lower extremities bilaterally. Neuro: Alert, extraocular movements intact, gait slow.   Speech fluent.   Psych: Sensorium intact responding to questions, attention normal, affect normal.  Judgment and insight appear normal.    Data Reviewed: I have personally reviewed following labs and imaging studies:  CBC: Recent Labs  Lab 01/18/18 0745 01/19/18 0641 01/20/18 0445 01/20/18 1442 01/21/18 0230 01/22/18 0500  WBC 19.6* 27.1* 25.6*  --  22.4* 17.6*  HGB 11.0* 7.2* 6.7* 7.6* 8.3* 7.4*  HCT 30.5* 21.1* 19.9* 22.9* 25.2* 22.9*  MCV 80.9 85.1 86.5  --  87.5 88.4  PLT 158 287 352  --  389 701*   Basic Metabolic Panel: Recent Labs  Lab 01/17/18 1216 01/18/18 0745 01/19/18 0641 01/20/18 0445 01/21/18 0230 01/22/18 0500  NA  --  139 141 144 142 140  K  --  3.2* 3.5 4.2 3.9 3.6  CL  --  104 104 108 106 108  CO2  --  25 27 30 28 29   GLUCOSE  --  120* 107* 126* 100* 93  BUN  --  69* 51* 40* 28* 21  CREATININE  --  3.26* 2.37* 2.03* 1.59* 1.56*  CALCIUM  --  7.3* 7.5* 7.5* 7.5* 7.0*  MG 2.2  --   --   --   --   --   PHOS  --  4.4 3.7 3.3 3.0 3.1   GFR: Estimated Creatinine Clearance: 48.9 mL/min (A) (by C-G formula based on SCr of 1.56 mg/dL (H)). Liver Function Tests: Recent Labs  Lab 01/18/18 0745 01/19/18 0641 01/20/18 0445 01/21/18 0230 01/22/18 0500  ALBUMIN 1.9* 1.9* 1.9* 2.0* 1.8*   No results for input(s): LIPASE, AMYLASE in the last 168 hours. No results for input(s): AMMONIA in the last 168 hours. Coagulation Profile: No results for input(s): INR, PROTIME in the last 168 hours. Cardiac Enzymes: No results for input(s): CKTOTAL, CKMB, CKMBINDEX, TROPONINI in the last 168 hours. BNP (last 3 results) No results for input(s): PROBNP in the last 8760 hours. HbA1C: No results for input(s): HGBA1C in the last 72 hours. CBG: No results for input(s): GLUCAP in the last 168 hours. Lipid Profile: No results for input(s): CHOL, HDL, LDLCALC, TRIG, CHOLHDL, LDLDIRECT in the last 72 hours. Thyroid Function Tests: No results for input(s): TSH, T4TOTAL,  FREET4, T3FREE, THYROIDAB in the last 72 hours. Anemia Panel: No results for input(s): VITAMINB12, FOLATE, FERRITIN, TIBC, IRON, RETICCTPCT in the last 72 hours. Urine analysis:    Component Value Date/Time   COLORURINE YELLOW 01/14/2018 1300   APPEARANCEUR HAZY (A) 01/14/2018 1300   LABSPEC 1.010 01/14/2018 1300   PHURINE 5.0 01/14/2018 1300   GLUCOSEU NEGATIVE 01/14/2018 1300   HGBUR SMALL (A) 01/14/2018 1300   BILIRUBINUR NEGATIVE 01/14/2018 1300   KETONESUR NEGATIVE 01/14/2018 1300   PROTEINUR NEGATIVE 01/14/2018 1300   NITRITE NEGATIVE 01/14/2018 1300   LEUKOCYTESUR NEGATIVE 01/14/2018 1300   Sepsis Labs: @LABRCNTIP (procalcitonin:4,lacticacidven:4)  ) No results found for this or any previous visit (from the past 240 hour(s)).       Radiology Studies: Ct Abdomen Pelvis Wo Contrast  Result Date: 01/21/2018 CLINICAL DATA:  Liver abscess. EXAM: CT ABDOMEN AND PELVIS WITHOUT CONTRAST TECHNIQUE: Multidetector CT imaging of the abdomen and pelvis was performed following the standard protocol without IV contrast. COMPARISON:  CT scan of January 09, 2018.  MRI of January 11, 2018. FINDINGS: Lower chest: Mild right pleural effusion is noted. Hepatobiliary: There is been interval placement of drainage catheter within large low density seen anteriorly in the liver. High density material is seen within this low density currently, suggesting hemorrhage. Overall, the low density is in large compared to prior exam, currently measuring 12.1 by 8.8 cm. There is interval development of subcapsular low density along the lateral portion of right hepatic lobe concerning for hematoma and/or purulent material. Stable 4.4 cm low density is noted posteriorly in right hepatic lobe. Pancreas: Unremarkable. No pancreatic ductal dilatation or surrounding inflammatory changes. Spleen: Normal in size without focal abnormality. Adrenals/Urinary Tract: Adrenal glands are unremarkable. Kidneys are normal, without  renal calculi, focal lesion, or hydronephrosis. Bladder is unremarkable. Stomach/Bowel: Stomach is within normal limits. Appendix appears normal. No evidence of bowel wall thickening, distention, or inflammatory changes. Vascular/Lymphatic: No significant vascular findings are present. No enlarged abdominal or pelvic lymph nodes. Reproductive: Enlarged fibroid uterus is again noted. Other: Mild anasarca is noted.  No hernia is noted. Musculoskeletal: No acute or significant osseous findings. IMPRESSION: Interval placement of percutaneous drainage catheter into abscess noted anteriorly in the liver, with interval development of hemorrhage within the abscess. The abscess is enlarged compared to prior  exam, now measuring 12.1 x 8.8 cm. There is interval development of moderate size subcapsular fluid collection along the lateral portion of right hepatic lobe which appears to contain hemorrhage and/or purulent material. Interval development of mild right pleural effusion. Enlarged fibroid uterus. Mild anasarca. Electronically Signed   By: Marijo Conception, M.D.   On: 01/21/2018 07:13        Scheduled Meds: . sodium chloride   Intravenous Once  . hydrALAZINE  25 mg Oral Q8H  . potassium chloride  10 mEq Oral Daily  . sodium chloride flush  5 mL Intracatheter Q8H   Continuous Infusions: . cefTRIAXone (ROCEPHIN)  IV Stopped (01/21/18 1124)     LOS: 13 days    Time spent: 25 minutes   Edwin Dada, MD [facility] 01/22/2018, 9:40 AM     Pager [phone] --- please page though AMION:  [email or web address] Password [email or web address] If 7PM-7AM, please contact night-coverage

## 2018-01-23 LAB — CBC
HCT: 23.2 % — ABNORMAL LOW (ref 36.0–46.0)
Hemoglobin: 7.5 g/dL — ABNORMAL LOW (ref 12.0–15.0)
MCH: 28.8 pg (ref 26.0–34.0)
MCHC: 32.3 g/dL (ref 30.0–36.0)
MCV: 89.2 fL (ref 78.0–100.0)
PLATELETS: 443 10*3/uL — AB (ref 150–400)
RBC: 2.6 MIL/uL — ABNORMAL LOW (ref 3.87–5.11)
RDW: 16.6 % — AB (ref 11.5–15.5)
WBC: 15.3 10*3/uL — AB (ref 4.0–10.5)

## 2018-01-23 LAB — RENAL FUNCTION PANEL
Albumin: 1.9 g/dL — ABNORMAL LOW (ref 3.5–5.0)
Anion gap: 4 — ABNORMAL LOW (ref 5–15)
BUN: 15 mg/dL (ref 8–23)
CALCIUM: 7.1 mg/dL — AB (ref 8.9–10.3)
CHLORIDE: 108 mmol/L (ref 98–111)
CO2: 28 mmol/L (ref 22–32)
CREATININE: 1.38 mg/dL — AB (ref 0.44–1.00)
GFR calc Af Amer: 46 mL/min — ABNORMAL LOW (ref >60)
GFR, EST NON AFRICAN AMERICAN: 40 mL/min — AB (ref >60)
Glucose, Bld: 98 mg/dL (ref 70–99)
Phosphorus: 3.2 mg/dL (ref 2.5–4.6)
Potassium: 3.5 mmol/L (ref 3.5–5.1)
SODIUM: 140 mmol/L (ref 135–145)

## 2018-01-23 LAB — TYPE AND SCREEN
ABO/RH(D): A NEG
Antibody Screen: NEGATIVE

## 2018-01-23 MED ORDER — AMLODIPINE BESYLATE 5 MG PO TABS
5.0000 mg | ORAL_TABLET | Freq: Every day | ORAL | Status: DC
  Administered 2018-01-23 – 2018-01-24 (×2): 5 mg via ORAL
  Filled 2018-01-23 (×2): qty 1

## 2018-01-23 NOTE — Care Management Note (Signed)
Case Management Note  Patient Details  Name: Lisa Bauer MRN: [id number] Date of Birth: Feb 20, 1956  Subjective/Objective:            Hepatic abscess        Action/Plan:  Patient has home IV abx at home already, is set up with [facility] for [facility] RN. Has DME No other CM needs identified.   Expected Discharge Date:                  Expected Discharge Plan:  [place]  In-House Referral:     Discharge planning Services  CM Consult  Post Acute Care Choice:  Home Health, Durable Medical Equipment Choice offered to:  Patient, Adult Children  DME Arranged:  3-N-1, [place] rolling DME Agency:  [place]:  RN [facility] Agency:     Status of Service:  Completed, signed off  If discussed at [place] of Stay Meetings, dates discussed:    Additional Comments:  Carles Collet, RN 01/23/2018, 1:39 PM

## 2018-01-23 NOTE — Progress Notes (Signed)
Patient ID: Lisa Bauer, female   DOB: 1955/10/12, 62 y.o.   MRN: [id number]         [facility] for Infectious Disease  Date of Admission:  01/09/2018    Total days of antibiotics 15        Day 11 ceftriaxone         ASSESSMENT: Overall she is a little better but I am concerned that the drain output has fallen significantly despite her recent CT showing enlargement of her multiloculated liver abscesses and superimposed hemorrhage.  If drain output remains low I feel we should talk to IR again to get their thoughts on whether or not it is safe and reasonable to either reposition her current drain and/or to place a second drain.  PLAN: 1. Continue ceftriaxone  Principal Problem:   Hepatic abscess Active Problems:   Bacteremia due to Streptococcus   ARF (acute renal failure) (HCC)   Severe sepsis with septic shock (HCC)   Elevated liver function tests   Anemia   Scheduled Meds: . sodium chloride   Intravenous Once  . amLODipine  5 mg Oral Daily  . potassium chloride  10 mEq Oral Daily  . sodium chloride flush  5 mL Intracatheter Q8H   Continuous Infusions: . cefTRIAXone (ROCEPHIN)  IV 2 g (01/23/18 1106)   PRN Meds:.acetaminophen, HYDROmorphone (DILAUDID) injection, ondansetron (ZOFRAN) IV, oxyCODONE, oxyCODONE, sodium chloride flush   SUBJECTIVE: She is still having severe, 8 out of 10 pain her pain medication wears off.  She continues to feel bloated but is having normal bowel movements.  Review of Systems: Review of Systems  Constitutional: Positive for malaise/fatigue. Negative for chills, diaphoresis and fever.  Gastrointestinal: Positive for abdominal pain. Negative for diarrhea, nausea and vomiting.    No Known Allergies  OBJECTIVE: Vitals:   01/22/18 2017 01/23/18 0517 01/23/18 0524 01/23/18 1218  BP: (!) 170/83   (!) 155/67  Pulse:    95  Resp: 18 16  16   Temp: 99.3 F (37.4 C) 99.4 F (37.4 C)  98.5 F (36.9 C)  TempSrc: Oral Oral  Oral    SpO2:    97%  Weight:   96.8 kg   Height:       Body mass index is 28.94 kg/m.  Physical Exam  Constitutional:  She is in good spirits.  She is sitting up in a chair visiting with her son.  Abdominal: Soft. She exhibits distension. There is tenderness.  5 cc of recorded output from her hepatic drain yesterday. She has about 5 cc of bloody fluid in the drain bulb currently.    Lab Results Lab Results  Component Value Date   WBC 15.3 (H) 01/23/2018   HGB 7.5 (L) 01/23/2018   HCT 23.2 (L) 01/23/2018   MCV 89.2 01/23/2018   PLT 443 (H) 01/23/2018    Lab Results  Component Value Date   CREATININE 1.38 (H) 01/23/2018   BUN 15 01/23/2018   NA 140 01/23/2018   K 3.5 01/23/2018   CL 108 01/23/2018   CO2 28 01/23/2018    Lab Results  Component Value Date   ALT 335 (H) 01/15/2018   AST 150 (H) 01/15/2018   ALKPHOS 259 (H) 01/15/2018   BILITOT 1.5 (H) 01/15/2018     Microbiology: No results found for this or any previous visit (from the past 240 hour(s)).  Michel Bickers, MD [facility] [place] [facility] [phone] pager   [phone] cell  01/23/2018, 2:24 PM

## 2018-01-23 NOTE — Progress Notes (Signed)
PROGRESS NOTE    Lisa Bauer  UYQ:[id number] DOB: 1956-02-12 DOA: 01/09/2018 PCP: Wenda Low, MD      Brief Narrative:  Lisa Bauer is a 62 y.o. F with diet-controlled HTN who presented with fever, sepsis syndrome and abdominal pain --> started on empiric broad-spectrum antibiotics, admitted to ICU on pressors.  Found to have liver abscess, thrombocytopenia, septic shock, renal failure.       Assessment & Plan:  Septic shock from liver abscess -Continue ceftriaxone -Outpatient parenteral antibiotics have been arranged   Liver hematoma   Acute blood loss anemia, with superimposed critical illness anemia -Trend CBC -Transfuse for Hgb <7 g/dL or symptoms  Acute renal failure Creatinine contineus to trend down, to 1.38 mg/dL today -Trend Hgb -Avoid HCTZ or ACEi/ARB until Cr stabilizes  Hypertension -Stop hydralazine -Start amlodipine 5 mg daily  SVT  Thrombocytopenia Resolved.  Did receive 2 units PRBCs.  Liver mass Biopsy negative for cancer.     DVT prophylaxis: SCDs Code Status: FULL Family Communication: Daughter at the bedside MDM and disposition Plan: The below labs and imaging reports were reviewed and summarize dabove.  Admitted with septic shock from liver abscess, causing renal failure and thrombocytopenia.    Also with acute blood loss anemia from hemorrhage into liver abscess.  Within the last 2 days she has had a 1 g/dL drop in Hgb, and at this time, I believe it is unsafe to discharge without further monitoring of blood loss, and that premature discharge could cause unreasonable risk of readmission.        Consultants:   Nephrology  Interventional radiology  Critical care  Procedures:   Liver biopsy and catheter drainage 8/9  Antimicrobials:   Flagyl x1 8/7  Vancomycin x1 8/7  Zosyn 8/7 >> 8/11  Ceftriaxone 8/12 >>    Culture data:   8/7 admission Blood cultures x2: 2/2 viridans strep  8/9 blood culture repeat  x1: NGTD  8/9 abscess aspirate: Group F strep       Subjective: She has been doing well with oxycodone alone at night.  Her pain is worse with movement, and still mdoerate to severe.  No worsening of pain, no melena or hematochezia.  Some dyspnea with ambualtion yesterday.       : Vitals:   01/22/18 2017 01/23/18 0517 01/23/18 0524 01/23/18 1218  BP: (!) 170/83   (!) 155/67  Pulse:    95  Resp: 18 16  16   Temp: 99.3 F (37.4 C) 99.4 F (37.4 C)  98.5 F (36.9 C)  TempSrc: Oral Oral  Oral  SpO2:    97%  Weight:   96.8 kg   Height:        Intake/Output Summary (Last 24 hours) at 01/23/2018 1237 Last data filed at 01/23/2018 1150 Gross per 24 hour  Intake 130 ml  Output 5 ml  Net 125 ml   Filed Weights   01/20/18 0439 01/21/18 0555 01/23/18 0524  Weight: 96.6 kg 97.3 kg 96.8 kg    Examination: General appearance: Adult female, sitting in recliner, interactive, no acute distress, appears very tired. HEENT: Anicteric, conjunctival pink, lids and lashes normal, no nasal deformity, discharge, or epistaxis.  Lips moist, teeth normal, hearing normal. Skin: Warm and dry, mild pallor. Cardiac: Regular rate and rhythm, flow murmur, improved lower extremity edema Respiratory: Normal respiratory rate and rhythm, lungs clear without rales or wheezes. Abdomen: I do not actually appreciate hepatomegaly, she has some voluntary guarding, I do not appreciate focal  tenderness..  MSK: No deformities or effusions of the large joints of the upper or lower extremities bilaterally. Neuro: Alert and oriented, extraocular movements intact, speech fluent.   Psych: Sensorium intact and responding to questions, attention normal, affect normal.  Judgment and insight appear normal.    Data Reviewed: I have personally reviewed following labs and imaging studies:  CBC: Recent Labs  Lab 01/19/18 0641 01/20/18 0445 01/20/18 1442 01/21/18 0230 01/22/18 0500 01/23/18 0430  WBC 27.1* 25.6*   --  22.4* 17.6* 15.3*  HGB 7.2* 6.7* 7.6* 8.3* 7.4* 7.5*  HCT 21.1* 19.9* 22.9* 25.2* 22.9* 23.2*  MCV 85.1 86.5  --  87.5 88.4 89.2  PLT 287 352  --  389 415* 509*   Basic Metabolic Panel: Recent Labs  Lab 01/17/18 1216  01/19/18 0641 01/20/18 0445 01/21/18 0230 01/22/18 0500 01/23/18 0430  NA  --    < > 141 144 142 140 140  K  --    < > 3.5 4.2 3.9 3.6 3.5  CL  --    < > 104 108 106 108 108  CO2  --    < > 27 30 28 29 28   GLUCOSE  --    < > 107* 126* 100* 93 98  BUN  --    < > 51* 40* 28* 21 15  CREATININE  --    < > 2.37* 2.03* 1.59* 1.56* 1.38*  CALCIUM  --    < > 7.5* 7.5* 7.5* 7.0* 7.1*  MG 2.2  --   --   --   --   --   --   PHOS  --    < > 3.7 3.3 3.0 3.1 3.2   < > = values in this interval not displayed.   GFR: Estimated Creatinine Clearance: 55.1 mL/min (A) (by C-G formula based on SCr of 1.38 mg/dL (H)). Liver Function Tests: Recent Labs  Lab 01/19/18 0641 01/20/18 0445 01/21/18 0230 01/22/18 0500 01/23/18 0430  ALBUMIN 1.9* 1.9* 2.0* 1.8* 1.9*   No results for input(s): LIPASE, AMYLASE in the last 168 hours. No results for input(s): AMMONIA in the last 168 hours. Coagulation Profile: No results for input(s): INR, PROTIME in the last 168 hours. Cardiac Enzymes: No results for input(s): CKTOTAL, CKMB, CKMBINDEX, TROPONINI in the last 168 hours. BNP (last 3 results) No results for input(s): PROBNP in the last 8760 hours. HbA1C: No results for input(s): HGBA1C in the last 72 hours. CBG: No results for input(s): GLUCAP in the last 168 hours. Lipid Profile: No results for input(s): CHOL, HDL, LDLCALC, TRIG, CHOLHDL, LDLDIRECT in the last 72 hours. Thyroid Function Tests: No results for input(s): TSH, T4TOTAL, FREET4, T3FREE, THYROIDAB in the last 72 hours. Anemia Panel: No results for input(s): VITAMINB12, FOLATE, FERRITIN, TIBC, IRON, RETICCTPCT in the last 72 hours. Urine analysis:    Component Value Date/Time   COLORURINE YELLOW 01/14/2018 1300    APPEARANCEUR HAZY (A) 01/14/2018 1300   LABSPEC 1.010 01/14/2018 1300   PHURINE 5.0 01/14/2018 1300   GLUCOSEU NEGATIVE 01/14/2018 1300   HGBUR SMALL (A) 01/14/2018 1300   BILIRUBINUR NEGATIVE 01/14/2018 1300   KETONESUR NEGATIVE 01/14/2018 1300   PROTEINUR NEGATIVE 01/14/2018 1300   NITRITE NEGATIVE 01/14/2018 1300   LEUKOCYTESUR NEGATIVE 01/14/2018 1300   Sepsis Labs: @LABRCNTIP (procalcitonin:4,lacticacidven:4)  ) No results found for this or any previous visit (from the past 240 hour(s)).       Radiology Studies: No results found.  Scheduled Meds: . sodium chloride   Intravenous Once  . amLODipine  5 mg Oral Daily  . potassium chloride  10 mEq Oral Daily  . sodium chloride flush  5 mL Intracatheter Q8H   Continuous Infusions: . cefTRIAXone (ROCEPHIN)  IV 2 g (01/23/18 1106)     LOS: 14 days    Time spent: 25 minutes   Edwin Dada, MD [facility] 01/23/2018, 12:37 PM     Pager [phone] --- please page though AMION:  [email or web address] Password [email or web address] If 7PM-7AM, please contact night-coverage

## 2018-01-23 NOTE — Progress Notes (Signed)
Referring Physician(s): Rockwell Alexandria  Supervising Physician: Jacqulynn Cadet  Patient Status:  [facility] - In-pt  Chief Complaint: Follow up liver abscess drain  Subjective:  62 y/o F with PMH significant for HTN presented to [facility] ED on 8/7 with complaints of fatigue, body aches, abdominal pain and n/v. She was found to be febrile with lactic acid 3.05, however WBC 9.9 at that time. She was admitted for sepsis workup. Initial blood cultures positive for strep.   CT abdomen/pelvis obtained on 8/7 which showed heterogeneously attenuating mass within liver, it was unclear if this area was hepatic mass vs abscess. MR abdomen w/o contrast was recommended by Dr. Anselm Pancoast which showed heterogenous mass involving almost entire segment 4 of liver and smaller lesion within lateral aspect of segment 7; this was thought to be compatible with primary neoplasm of metastatic disease. A biopsy of the larger lesion was performed by Dr. Kathlene Cote on 8/9 with drain placement. Pathology shows this was consistent with abscess. WBC was initial normal however continued to trend upward with max 39.0 on 8/13. She continues on IV abx per ID, WBC continues to trend down.Patient was noted to have H/H which began to trend down on 8/7 eventually requiring transfusion of 2 units PRBC on 8/18 due to hemoglobin of 6.7 without evidence of bleeding.  Drain output continued to trend downward and repeat CT abdomen/pelvis was performed on 8/19 which showed that the abscess was enlarged compared with previous study and there was now hemorrhage within the abscess. Additionally, moderate sized subcapsular fluid collection along the lateral portion of right hepatic lobe was noted which appeared to contain hemorrhage and/or purulent material.   Patient reports that she continues to have some RUQ pain although if she stays on top of her pain medicine it is tolerable. She reports appetite is ok, regular bowel movements without melena or hematochezia.  She is hopeful she can go home soon.   Allergies: Patient has no known allergies.  Medications: Prior to Admission medications   Medication Sig Start Date End Date Taking? Authorizing Provider  cholecalciferol (VITAMIN D) 1000 units tablet Take 1,000 Units by mouth daily.   Yes [provider]  lisinopril-hydrochlorothiazide (PRINZIDE,ZESTORETIC) 20-25 MG tablet Take 1 tablet by mouth at bedtime.  07/14/16  Yes [provider]  ondansetron (ZOFRAN-ODT) 8 MG disintegrating tablet Take 8 mg by mouth every 8 (eight) hours as needed for nausea or vomiting.   Yes [provider]  phenazopyridine (PYRIDIUM) 200 MG tablet Take 200 mg by mouth 3 (three) times daily as needed for pain.   Yes [provider]  TURMERIC PO Take 2 capsules by mouth at bedtime.   Yes [provider]  cefTRIAXone (ROCEPHIN) IVPB Inject 2 g into the vein daily for 16 days. Indication:  Hepatic abscess/bacteremia Last Day of Therapy:  02/06/18 Labs - Once weekly:  CBC/D and BMP, Labs - Every other week:  ESR and CRP 01/21/18 02/06/18  Danford, Suann Larry, MD     Vital Signs: BP (!) 170/83 (BP Location: Left Arm)   Pulse 76   Temp 99.4 F (37.4 C) (Oral)   Resp 16   Ht 6' (1.829 m)   Wt 213 lb 6.4 oz (96.8 kg)   LMP  (LMP Unknown)   SpO2 98%   BMI 28.94 kg/m   Physical Exam  Constitutional: She is oriented to person, place, and time. No distress.  HENT:  Head: Normocephalic.  Cardiovascular: Normal rate, regular rhythm and normal heart  sounds.  Pulmonary/Chest: Effort normal and breath sounds normal.  Abdominal: Soft. She exhibits no distension. There is tenderness (RUQ).  RUQ JP drain with ~10 cc dark bloody output; flushes easily, aspirates with some resistance. Insertion site unremarkable.  Neurological: She is alert and oriented to person, place, and time.  Skin: Skin is warm and dry. She is not diaphoretic.  Psychiatric: She has a normal mood and affect. Her  behavior is normal. Judgment and thought content normal.  Nursing note and vitals reviewed.   Imaging: Ct Abdomen Pelvis Wo Contrast  Result Date: 01/21/2018 CLINICAL DATA:  Liver abscess. EXAM: CT ABDOMEN AND PELVIS WITHOUT CONTRAST TECHNIQUE: Multidetector CT imaging of the abdomen and pelvis was performed following the standard protocol without IV contrast. COMPARISON:  CT scan of January 09, 2018.  MRI of January 11, 2018. FINDINGS: Lower chest: Mild right pleural effusion is noted. Hepatobiliary: There is been interval placement of drainage catheter within large low density seen anteriorly in the liver. High density material is seen within this low density currently, suggesting hemorrhage. Overall, the low density is in large compared to prior exam, currently measuring 12.1 by 8.8 cm. There is interval development of subcapsular low density along the lateral portion of right hepatic lobe concerning for hematoma and/or purulent material. Stable 4.4 cm low density is noted posteriorly in right hepatic lobe. Pancreas: Unremarkable. No pancreatic ductal dilatation or surrounding inflammatory changes. Spleen: Normal in size without focal abnormality. Adrenals/Urinary Tract: Adrenal glands are unremarkable. Kidneys are normal, without renal calculi, focal lesion, or hydronephrosis. Bladder is unremarkable. Stomach/Bowel: Stomach is within normal limits. Appendix appears normal. No evidence of bowel wall thickening, distention, or inflammatory changes. Vascular/Lymphatic: No significant vascular findings are present. No enlarged abdominal or pelvic lymph nodes. Reproductive: Enlarged fibroid uterus is again noted. Other: Mild anasarca is noted.  No hernia is noted. Musculoskeletal: No acute or significant osseous findings. IMPRESSION: Interval placement of percutaneous drainage catheter into abscess noted anteriorly in the liver, with interval development of hemorrhage within the abscess. The abscess is enlarged  compared to prior exam, now measuring 12.1 x 8.8 cm. There is interval development of moderate size subcapsular fluid collection along the lateral portion of right hepatic lobe which appears to contain hemorrhage and/or purulent material. Interval development of mild right pleural effusion. Enlarged fibroid uterus. Mild anasarca. Electronically Signed   By: Marijo Conception, M.D.   On: 01/21/2018 07:13    Labs:  CBC: Recent Labs    01/20/18 0445 01/20/18 1442 01/21/18 0230 01/22/18 0500 01/23/18 0430  WBC 25.6*  --  22.4* 17.6* 15.3*  HGB 6.7* 7.6* 8.3* 7.4* 7.5*  HCT 19.9* 22.9* 25.2* 22.9* 23.2*  PLT 352  --  389 415* 443*    COAGS: Recent Labs    01/09/18 1151  INR 1.24    BMP: Recent Labs    01/20/18 0445 01/21/18 0230 01/22/18 0500 01/23/18 0430  NA 144 142 140 140  K 4.2 3.9 3.6 3.5  CL 108 106 108 108  CO2 30 28 29 28   GLUCOSE 126* 100* 93 98  BUN 40* 28* 21 15  CALCIUM 7.5* 7.5* 7.0* 7.1*  CREATININE 2.03* 1.59* 1.56* 1.38*  GFRNONAA 25* 34* 35* 40*  GFRAA 29* 39* 40* 46*    LIVER FUNCTION TESTS: Recent Labs    01/12/18 0258 01/13/18 0400 01/14/18 0350 01/15/18 0500  01/20/18 0445 01/21/18 0230 01/22/18 0500 01/23/18 0430  BILITOT 3.2* 2.0* 1.9* 1.5*  --   --   --   --   --  AST 1,080* 554* 260* 150*  --   --   --   --   --   ALT 659* 549* 466* 335*  --   --   --   --   --   ALKPHOS 114 148* 193* 259*  --   --   --   --   --   PROT 4.7* 4.6* 5.2* 5.5*  --   --   --   --   --   ALBUMIN 2.1* 1.8* 1.8* 1.8*   < > 1.9* 2.0* 1.8* 1.9*   < > = values in this interval not displayed.    Assessment and Plan:  Follow-up liver abscess drain placed on 8/9 by Dr. Kathlene Cote - repeat CT scan from 8/19 shows hemorrhage of abscess where IR drain is located as well as moderate subcapsular fluid collection along the lateral portion of the right hepatic lobe consistent with hemorrhage and/or purulent material. Minimal bloody drainage noted in suction bulb on exam  today, 5 cc total recorded last 24 hours; flushes well, aspirates with some resistance, bulb with appropriate suction. Patient previously requiring 2 units PRBC although no clinical signs of bleeding; FOBT (+) on 8/20 - GI consulted who plans for outpatient colonoscopy to assess. H/H low but stable (7.5/23.2), platelets increasing 443, WBC continues to trend down (15.3) and LFTs from 8/13 decreasing - ALT 150, AST 335, ALP 259.  IR to continue to follow drain while patient remains in house, will schedule follow up drain injection/repeat CT 1-2 weeks after d/c.  Continue routine drain care with 3-5 mL saline flushes BID, record output. Discharge per admitting service.   Electronically Signed: Joaquim Nam, PA-C 01/23/2018, 9:29 AM   I spent a total of 15 Minutes at the the patient's bedside AND on the patient's hospital floor or unit, greater than 50% of which was counseling/coordinating care for liver abscess drain.

## 2018-01-24 ENCOUNTER — Other Ambulatory Visit: Payer: Self-pay | Admitting: Physician Assistant

## 2018-01-24 ENCOUNTER — Other Ambulatory Visit: Payer: Self-pay | Admitting: Internal Medicine

## 2018-01-24 DIAGNOSIS — K750 Abscess of liver: Secondary | ICD-10-CM

## 2018-01-24 DIAGNOSIS — I471 Supraventricular tachycardia: Secondary | ICD-10-CM

## 2018-01-24 DIAGNOSIS — D62 Acute posthemorrhagic anemia: Secondary | ICD-10-CM

## 2018-01-24 DIAGNOSIS — I10 Essential (primary) hypertension: Secondary | ICD-10-CM

## 2018-01-24 LAB — CBC
HCT: 24.9 % — ABNORMAL LOW (ref 36.0–46.0)
Hemoglobin: 7.9 g/dL — ABNORMAL LOW (ref 12.0–15.0)
MCH: 28.7 pg (ref 26.0–34.0)
MCHC: 31.7 g/dL (ref 30.0–36.0)
MCV: 90.5 fL (ref 78.0–100.0)
Platelets: 428 10*3/uL — ABNORMAL HIGH (ref 150–400)
RBC: 2.75 MIL/uL — ABNORMAL LOW (ref 3.87–5.11)
RDW: 16.6 % — AB (ref 11.5–15.5)
WBC: 11.9 10*3/uL — ABNORMAL HIGH (ref 4.0–10.5)

## 2018-01-24 LAB — RENAL FUNCTION PANEL
Albumin: 2 g/dL — ABNORMAL LOW (ref 3.5–5.0)
Anion gap: 5 (ref 5–15)
BUN: 12 mg/dL (ref 8–23)
CO2: 29 mmol/L (ref 22–32)
Calcium: 7.4 mg/dL — ABNORMAL LOW (ref 8.9–10.3)
Chloride: 106 mmol/L (ref 98–111)
Creatinine, Ser: 1.23 mg/dL — ABNORMAL HIGH (ref 0.44–1.00)
GFR calc Af Amer: 53 mL/min — ABNORMAL LOW
GFR calc non Af Amer: 46 mL/min — ABNORMAL LOW
Glucose, Bld: 100 mg/dL — ABNORMAL HIGH (ref 70–99)
Phosphorus: 2.9 mg/dL (ref 2.5–4.6)
Potassium: 3.2 mmol/L — ABNORMAL LOW (ref 3.5–5.1)
Sodium: 140 mmol/L (ref 135–145)

## 2018-01-24 MED ORDER — OXYCODONE HCL 5 MG PO TABS: 5.0000 mg | ORAL_TABLET | Freq: Every day | ORAL | 0 refills | Status: AC | PRN

## 2018-01-24 MED ORDER — AMLODIPINE BESYLATE 5 MG PO TABS: 5.0000 mg | ORAL_TABLET | Freq: Every day | ORAL | 0 refills | Status: AC

## 2018-01-24 MED ORDER — HEPARIN SOD (PORK) LOCK FLUSH 100 UNIT/ML IV SOLN
250.0000 [IU] | INTRAVENOUS | Status: AC | PRN
  Administered 2018-01-24: 250 [IU]

## 2018-01-24 MED ORDER — POTASSIUM CHLORIDE CRYS ER 10 MEQ PO TBCR: 10.0000 meq | EXTENDED_RELEASE_TABLET | Freq: Every day | ORAL | 0 refills | Status: AC

## 2018-01-24 MED ORDER — POTASSIUM CHLORIDE CRYS ER 20 MEQ PO TBCR
40.0000 meq | EXTENDED_RELEASE_TABLET | Freq: Once | ORAL | Status: AC
  Administered 2018-01-24: 40 meq via ORAL
  Filled 2018-01-24: qty 2

## 2018-01-24 NOTE — Plan of Care (Signed)
  Problem: Education: Goal: Knowledge of General Education information will improve Description Including pain rating scale, medication(s)/side effects and non-pharmacologic comfort measures Outcome: Adequate for Discharge   Problem: Health Behavior/Discharge Planning: Goal: Ability to manage health-related needs will improve Outcome: Adequate for Discharge   Problem: Clinical Measurements: Goal: Ability to maintain clinical measurements within normal limits will improve Outcome: Adequate for Discharge Goal: Will remain free from infection Outcome: Adequate for Discharge Goal: Diagnostic test results will improve Outcome: Adequate for Discharge Goal: Respiratory complications will improve Outcome: Adequate for Discharge Goal: Cardiovascular complication will be avoided Outcome: Adequate for Discharge   Problem: Coping: Goal: Level of anxiety will decrease Outcome: Adequate for Discharge   Problem: Pain Managment: Goal: General experience of comfort will improve Outcome: Adequate for Discharge   Problem: Safety: Goal: Ability to remain free from injury will improve Outcome: Adequate for Discharge   Problem: Skin Integrity: Goal: Risk for impaired skin integrity will decrease Outcome: Adequate for Discharge

## 2018-01-24 NOTE — Discharge Summary (Signed)
Physician Discharge Summary  Lisa Bauer EYC:[id number] DOB: August 07, 1955 DOA: 01/09/2018  PCP: Wenda Low, MD  Admit date: 01/09/2018 Discharge date: 01/24/2018  Admitted From: Home  Disposition:  Home   Recommendations for Outpatient Follow-up:  1. Follow up with PCP in 1 week 2. Home health: Please obtain BMP/CBC in 3 days on Aug 25 and fax to Dr. Lysle Rubens 3. Follow up with Interventional Radiology as directed in 1-2 weeks 4. Follow up with Infectious Disease before 9/4 5. Monitor BP and restart HCTZ when Cr stabilizing     Home Health: Yes  Equipment/Devices: Rolling walker, 3-in-1  Discharge Condition: Fair  CODE STATUS: FULL Diet recommendation: Regular  Brief/Interim Summary: Lisa Bauer is a 62 y.o. F with diet-controlled HTN who presented with 1 day fever and abdominal pain, found to have sepsis syndrome --> started on empiric broad-spectrum antibiotics, admitted to ICU on pressors.  Found to have liver abscess, thrombocytopenia, septic shock, renal failure.      Discharge Diagnoses:   Septic shock from liver abscess Streptococcal bacteremia Admitted to ICU on pressors, broad spectrum empiric antibiotics.  Percutaneous drain placed by Dr. Kathlene Cote on 8/9.   -Blood cultures growing Viridans strep, abscess aspirate culture growing beta-hemolytic strep, Group F.   -Repeat cultures negative.  Echo negative for vegetation.   -Narrowed to Ceftriaxone and afebrile since, WBC steadily trending down.  Repeat CT on 8/19 showed interval slight enlargement of main abscess, suspicious for hemorrhage into abscess, persistent posterior lesion, small subcapsular fluid collection.  Discussed with Dr. Barbie Banner and Dr. Megan Salon of IR and ID, respectively.  Per IR, placing a second drain would be high risk for worsening bleeding without likely benefit given current imaging findins, and drain repositioning also unlikely to provide benefit in this patient who is clinically improving.     -She will be on daily Ceftriaxone via PICC until at least 9/4, plan for repeat imaging and IR and ID follow ups in interim to determine further antibiotics IV vs PO     Acute blood loss anemia, with superimposed critical illness anemia Liver hematoma It is difficult to know how much ongoing blood loss into her liver abscess/hematoma she had this last week, but now that we have seen her Hgb trend up this last two days I strongly favor that the majority of bleeding happened when her platelets were low and that there is minimal ongoing bleeding at this time, just that we saw trend down due to bone marrow suppression, maybe phlebotomy.   She received 2U PRBCs on 8/18.  There was + FOBT, but no frank melena or hematochezia at any time.  She was evaluated by Dr. Therisa Doyne and has outpatient GI follow up.    Acute renal failure Creatinine greater than doubled at admission, trended up to peak >4 mg/dL, since then plateau and steadily trending down, great UOP.  Avoided HCTZ or ACEi/ARB while Cr improving.  To follow up with PCP.   Hypertension HCTZ/ACEi stopped.  Started new amlodipine.  This needs titration and addition of diuretic at discretion of PCP when creatinine stabilized.  Supraventricular tachycardia, self-limited There was a brief episode of SVT on monitors while patient was hypokalemic.  This was 16 beats, asymptomatic.  No further work up indicated.   Thrombocytopenia Due to sepsis.  Resolved.  Did receive 2 packs platelets.  Liver mass MRI showed "large heterogenous mass" consistent with primary cancer or metastatic disease.  Biopsy however negative for cancer.  Follow up with [facility].  Discharge Instructions  Discharge Instructions    Diet general   Complete by:  As directed    Discharge instructions   Complete by:  As directed    From Dr. Loleta Books: You were admitted with a liver abscess. This infection led to "septic shock", which is a term we use to  describe when an infection (and the body's exuberant response to infection) results in organ injury. You also had kidney injury/failure which is now mostly resolved, temporary bone marrow failure (as evidenced by your drop in platelet levels and your anemia in part), and you needed vasopressors for a brief time initially (medicines to bring your blood pressure up).  Thankfully, all of these consequences of the infection have gotten so much better.  Your kidney function is much much better.  Your creatinine is down to 1.29 mg/dL and I suspect the extra fluid/swelling will go away soon. Keep taking potassium daily 10 mEq and we will have the home health agency check your electrolytes.  Your platelet count is all back to normal, and your Hemoglobin (red blood cell levels) is trending up, 7.9 g/L today on the day of discharge.  Some minor fluctuation around this level is normal.  We will have the home health agency check it over the weekend.  For the drain: Flush drain with 5 mL of sterile saline daily.  Keep drain insertion site clean and dry.  Do not submerge.  Record amount of output from drain daily and bring log with you to IR follow-up appointment.  You will hear from schedulers with date and time of appointment.   For follow up with Dr. Lysle Rubens: Call his office and schedule to see him next week or early the week after. Discuss blood pressure medicines with him. For now, take amlodipine 5 mg daily  For follow up with infectious disease: If they don't call you by Friday to schedule, then call their office at [facility] infusion instructions Lisa Bauer follow [place] Dosing Protocol; Bauer administer Cathflo as needed to maintain patency of vascular access device.; Flushing of vascular access device: per [facility] Protocol: 0.9% NaCl pre/post medica...   Complete by:  As directed    Instructions:  Bauer follow [place] Dosing Protocol   Instructions:  Bauer administer  Cathflo as needed to maintain patency of vascular access device.   Instructions:  Flushing of vascular access device: per [facility] Protocol: 0.9% NaCl pre/post medication administration and prn patency; Heparin 100 u/ml, 54m for implanted ports and Heparin 10u/ml, 51mfor all other central venous catheters.   Instructions:  Bauer follow [facility] Anaphylaxis Protocol for First Dose Administration in the home: 0.9% NaCl at 25-50 ml/hr to maintain IV access for protocol meds. Epinephrine 0.3 ml IV/IM PRN and Benadryl 25-50 IV/IM PRN s/s of anaphylaxis.   Instructions:  [facility] Coordinator (RN) to assist per patient IV care needs in the home PRN.   Increase activity slowly   Complete by:  As directed      Allergies as of 01/24/2018   No Known Allergies     Medication List    STOP taking these medications   fluconazole 150 MG tablet Commonly known as:  DIFLUCAN   lisinopril-hydrochlorothiazide 20-25 MG tablet Commonly known as:  PRINZIDE,ZESTORETIC   nitrofurantoin (macrocrystal-monohydrate) 100 MG capsule Commonly known as:  MACROBID   ondansetron 8 MG disintegrating tablet Commonly known as:  ZOFRAN-ODT   phenazopyridine 200 MG tablet Commonly known as:  PYRIDIUM  TAKE these medications   amLODipine 5 MG tablet Commonly known as:  NORVASC Take 1 tablet (5 mg total) by mouth daily.   cefTRIAXone  IVPB Commonly known as:  ROCEPHIN Inject 2 g into the vein daily for 16 days. Indication:  Hepatic abscess/bacteremia Last Day of Therapy:  02/06/18 Labs - Once weekly:  CBC/D and BMP, Labs - Every other week:  ESR and CRP   cholecalciferol 1000 units tablet Commonly known as:  VITAMIN D Take 1,000 Units by mouth daily.   oxyCODONE 5 MG immediate release tablet Commonly known as:  Oxy IR/ROXICODONE Take 1 tablet (5 mg total) by mouth 5 (five) times daily as needed for up to 5 days for severe pain or breakthrough pain.   potassium chloride 10 MEQ tablet Commonly known  as:  K-DUR,KLOR-CON Take 1 tablet (10 mEq total) by mouth daily.   TURMERIC PO Take 2 capsules by mouth at bedtime.            Home Infusion Instuctions  (From admission, onward)         Start     Ordered   01/19/18 0000  Home infusion instructions [facility] Bauer follow [place] Dosing Protocol; Bauer administer Cathflo as needed to maintain patency of vascular access device.; Flushing of vascular access device: per [facility] Protocol: 0.9% NaCl pre/post medica...    Question Answer Comment  Instructions Bauer follow [place] Dosing Protocol   Instructions Bauer administer Cathflo as needed to maintain patency of vascular access device.   Instructions Flushing of vascular access device: per [facility] Protocol: 0.9% NaCl pre/post medication administration and prn patency; Heparin 100 u/ml, 51m for implanted ports and Heparin 10u/ml, 545mfor all other central venous catheters.   Instructions Bauer follow [facility] Anaphylaxis Protocol for First Dose Administration in the home: 0.9% NaCl at 25-50 ml/hr to maintain IV access for protocol meds. Epinephrine 0.3 ml IV/IM PRN and Benadryl 25-50 IV/IM PRN s/s of anaphylaxis.   Instructions [facility] Infusion Coordinator (RN) to assist per patient IV care needs in the home PRN.      01/19/18 1128           Durable Medical Equipment  (From admission, onward)         Start     Ordered   01/24/18 0828  For home use only DME Walker rolling  [facility]    Question:  Patient needs a walker to treat with the following condition  Answer:  Sepsis (H[place]con  01/24/18 [id number] 01/24/18 0828  DME 3-in-1  Once     01/24/18 0828   01/19/18 1227  For home use only DME Walker rolling  Once    Question:  Patient needs a walker to treat with the following condition  Answer:  Weakness   01/19/18 1227   01/19/18 1227  For home use only DME 3 n 1  Once     01/19/18 12[place] up.    Contact information: [phone]      [facility], [place]   Specialty:  Home Health Services Contact information: [address] [phone]        YaAletta EdouardMD Follow up.   Specialties:  Interventional Radiology, Radiology Why:  You will hear from schedulers with date  and time of appointment. Contact information: [address] [place] [address] [phone]          No Known Allergies  Consultations:  Critical care  Infectious disease  Nephrology  Interventional radiology  Gastroenterology   Procedures/Studies: Ct Abdomen Pelvis Wo Contrast  Result Date: 01/21/2018 CLINICAL DATA:  Liver abscess. EXAM: CT ABDOMEN AND PELVIS WITHOUT CONTRAST TECHNIQUE: Multidetector CT imaging of the abdomen and pelvis was performed following the standard protocol without IV contrast. COMPARISON:  CT scan of January 09, 2018.  MRI of January 11, 2018. FINDINGS: Lower chest: Mild right pleural effusion is noted. Hepatobiliary: There is been interval placement of drainage catheter within large low density seen anteriorly in the liver. High density material is seen within this low density currently, suggesting hemorrhage. Overall, the low density is in large compared to prior exam, currently measuring 12.1 by 8.8 cm. There is interval development of subcapsular low density along the lateral portion of right hepatic lobe concerning for hematoma and/or purulent material. Stable 4.4 cm low density is noted posteriorly in right hepatic lobe. Pancreas: Unremarkable. No pancreatic ductal dilatation or surrounding inflammatory changes. Spleen: Normal in size without focal abnormality. Adrenals/Urinary Tract: Adrenal glands are unremarkable. Kidneys are normal, without renal calculi, focal lesion, or hydronephrosis. Bladder is unremarkable. Stomach/Bowel: Stomach is within normal limits.  Appendix appears normal. No evidence of bowel wall thickening, distention, or inflammatory changes. Vascular/Lymphatic: No significant vascular findings are present. No enlarged abdominal or pelvic lymph nodes. Reproductive: Enlarged fibroid uterus is again noted. Other: Mild anasarca is noted.  No hernia is noted. Musculoskeletal: No acute or significant osseous findings. IMPRESSION: Interval placement of percutaneous drainage catheter into abscess noted anteriorly in the liver, with interval development of hemorrhage within the abscess. The abscess is enlarged compared to prior exam, now measuring 12.1 x 8.8 cm. There is interval development of moderate size subcapsular fluid collection along the lateral portion of right hepatic lobe which appears to contain hemorrhage and/or purulent material. Interval development of mild right pleural effusion. Enlarged fibroid uterus. Mild anasarca. Electronically Signed   By: Marijo Conception, M.D.   On: 01/21/2018 07:13   Ct Abdomen Pelvis Wo Contrast  Result Date: 01/09/2018 CLINICAL DATA:  62 year old female with a history of abdominal pain with nausea and vomiting EXAM: CT ABDOMEN AND PELVIS WITHOUT CONTRAST TECHNIQUE: Multidetector CT imaging of the abdomen and pelvis was performed following the standard protocol without IV contrast. COMPARISON:  09/24/2016 FINDINGS: Lower chest: No pleural effusion or confluent airspace disease. Hepatobiliary: Heterogeneously attenuating mass of the liver, prominently within segment 4, though extending into the right liver, segment 2 and segment 3. Estimated diameter on the axial images 9.5 cm x 8.7 cm. Additional nodule/mass within the periphery of the right liver on image 19. Gallbladder is relatively decompressed.  No radiopaque gallstones. Pancreas: Unremarkable appearance of pancreas Spleen: Unremarkable spleen Adrenals/Urinary Tract: Unremarkable adrenal glands. Bilateral kidneys without hydronephrosis or nephrolithiasis.  Unremarkable course the bilateral ureters. Urinary bladder is relatively decompressed. Stomach/Bowel: Stomach unremarkable. No abnormally distended small bowel. No transition point. No focal inflammatory changes of the small bowel mesentery. Normal appendix. Colon is decompressed. Vascular/Lymphatic: No significant vascular calcifications. No mesenteric, retroperitoneal, or inguinal adenopathy. Reproductive: Fibroid changes of the uterus. Other: No large ventral wall hernia. Musculoskeletal: Grade 1 anterolisthesis of L4 on L5. Vacuum disc phenomenon at L4-L5 and L5-S1. No bony canal narrowing. Degenerative changes of the facets. Mild degenerative changes of the hips. No acute fracture  IMPRESSION: Heterogeneously attenuating mass within the liver, predominantly segment 4 measuring 9.5 cm by 8.7 cm. Differential includes abscess, primary liver tumor/biliary tumor, or metastases. Additional small pathologic focus within the lateral aspect of the right liver measuring approximately 2.6 cm. These results were called by telephone at the time of interpretation on 01/09/2018 at 2:25 pm to Dr. Lajean Saver , who verbally acknowledged these results. Fibroid changes of the uterus. Electronically Signed   By: Corrie Mckusick D.O.   On: 01/09/2018 14:28   Dg Chest 2 View  Result Date: 01/09/2018 CLINICAL DATA:  Fever, weakness and dizziness over the last 3 days. EXAM: CHEST - 2 VIEW COMPARISON:  09/23/2017 FINDINGS: Heart and mediastinal shadows are normal. Lung volumes are diminished which could be due to a poor inspiration for underlying lung disease. Lung markings appear more prominent diffusely, which again could relate to the poor inspiration or could indicate mild diffuse pneumonitis. No dense consolidation or lobar collapse. No effusions. No significant bone finding. IMPRESSION: Poor inspiration versus widespread pneumonitis. No dense consolidation or collapse. Electronically Signed   By: Nelson Chimes M.D.   On:  01/09/2018 11:29   US Guided Needle Placement  Result Date: 01/11/2018 INDICATION: Heterogeneous mass versus abscess of the liver with liquefied component. Presentation with abdominal pain and sepsis with positive blood cultures. EXAM: 1. ULTRASOUND-GUIDED CORE BIOPSY LIVER 2. ULTRASOUND GUIDED PERCUTANEOUS CATHETER DRAINAGE OF LIVER MEDICATIONS: The patient is currently admitted to the hospital and receiving intravenous antibiotics. The antibiotics were administered within an appropriate time frame prior to the initiation of the procedure. ANESTHESIA/SEDATION: Fentanyl 75 mcg IV; Versed 1.5 mg IV Moderate Sedation Time:  31 minutes. The patient was continuously monitored during the procedure by the interventional radiology nurse under my direct supervision. COMPLICATIONS: None immediate. PROCEDURE: Informed written consent was obtained from the patient after a thorough discussion of the procedural risks, benefits and alternatives. All questions were addressed. Maximal Sterile Barrier Technique was utilized including caps, mask, sterile gowns, sterile gloves, sterile drape, hand hygiene and skin antiseptic. A timeout was performed prior to the initiation of the procedure. Ultrasound was performed of the liver. Under ultrasound guidance, a 17 gauge needle was advanced into the right lobe. 18 gauge core biopsy samples were obtained with 3 samples submitted in formalin. The 17 gauge needle was then advanced into a liquified component. Aspiration of fluid was performed and sample sent for both culture analysis and cytology. A guidewire was advanced through the needle. The tract was dilated and a 10 French percutaneous drainage catheter advanced. Catheter position was confirmed by ultrasound. The catheter was connected to a suction bulb. The drainage catheter was secured at the skin with a Prolene retention suture and adhesive StatLock device. FINDINGS: Large heterogeneous abnormality of the liver is present with  ill-defined borders. The area of parenchymal abnormality spans at least an area 11 cm. The echogenic rim surrounding a liquified component was sampled with tissue biopsy. Aspiration at the level of the liquefied component yielded bloody and turbid fluid. A 10 French drain was placed in this collection which is draining bloody fluid. IMPRESSION: Ultrasound-guided biopsy of the liver lesion along the edge of the liquefied component followed by aspiration of the liquefied component yielding bloody and turbid fluid which was sent for culture analysis and cytology. Additional placement of a 10 French percutaneous drainage catheter into the liquefied component which was attached to suction bulb drainage. Electronically Signed   By: Aletta Edouard M.D.   On: 01/11/2018 17:15  Mr Abdomen Mrcp Wo Contrast  Result Date: 01/10/2018 CLINICAL DATA:  Abdominal pain. 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 EXAM: MRI ABDOMEN WITHOUT CONTRAST  (INCLUDING MRCP) TECHNIQUE: Multiplanar multisequence MR imaging of the abdomen was performed. Heavily T2-weighted images of the biliary and pancreatic ducts were obtained, and three-dimensional MRCP images were rendered by post processing. COMPARISON:  01/09/2018 FINDINGS: Lower chest: Small bilateral pleural effusions identified. Hepatobiliary: There is a large heterogeneous mass involving much of the entirety of segment 4. The mass measures 10.2 by 10.5 by 9.4 cm, image 14/6. A second smaller lesion is identified within the periphery of segment 7 measuring 2.7 cm, image 29/2. The common bile duct has a normal caliber. Pericholecystic fluid is identified. No gallstones identified. Pancreas: Within the limitations of unenhanced technique no pancreas abnormality identified. No main duct dilatation or mass. Spleen:  Normal appearance of the spleen. Adrenals/Urinary Tract: Normal appearance of the adrenal glands. No kidney mass or hydronephrosis identified. Stomach/Bowel: The stomach is  normal. The visualized bowel loops within the upper abdomen are unremarkable. Vascular/Lymphatic: Normal appearance of the abdominal aorta. No retroperitoneal adenopathy. Other: There is a small amount of free fluid identified within the upper abdomen. Musculoskeletal: No suspicious bone lesions identified. IMPRESSION: 1. Examination confirms presence of large heterogeneous mass involving almost the entire segment 4 of the liver. A smaller lesion is identified within the lateral aspect of segment 7. Imaging findings are compatible with either primary neoplasm of the liver such as cholangiocarcinoma or metastatic disease. The dominant mass should be easily amendable to image guided percutaneous biopsy. Additionally, consider further investigation with PET-CT to assess for distant metastatic disease. 2. Small pleural effusions. Electronically Signed   By: Kerby Moors M.D.   On: 01/10/2018 10:25   Mr 3d Recon At Scanner  Result Date: 01/10/2018 CLINICAL DATA:  Abdominal pain. 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 EXAM: MRI ABDOMEN WITHOUT CONTRAST  (INCLUDING MRCP) TECHNIQUE: Multiplanar multisequence MR imaging of the abdomen was performed. Heavily T2-weighted images of the biliary and pancreatic ducts were obtained, and three-dimensional MRCP images were rendered by post processing. COMPARISON:  01/09/2018 FINDINGS: Lower chest: Small bilateral pleural effusions identified. Hepatobiliary: There is a large heterogeneous mass involving much of the entirety of segment 4. The mass measures 10.2 by 10.5 by 9.4 cm, image 14/6. A second smaller lesion is identified within the periphery of segment 7 measuring 2.7 cm, image 29/2. The common bile duct has a normal caliber. Pericholecystic fluid is identified. No gallstones identified. Pancreas: Within the limitations of unenhanced technique no pancreas abnormality identified. No main duct dilatation or mass. Spleen:  Normal appearance of the spleen. Adrenals/Urinary  Tract: Normal appearance of the adrenal glands. No kidney mass or hydronephrosis identified. Stomach/Bowel: The stomach is normal. The visualized bowel loops within the upper abdomen are unremarkable. Vascular/Lymphatic: Normal appearance of the abdominal aorta. No retroperitoneal adenopathy. Other: There is a small amount of free fluid identified within the upper abdomen. Musculoskeletal: No suspicious bone lesions identified. IMPRESSION: 1. Examination confirms presence of large heterogeneous mass involving almost the entire segment 4 of the liver. A smaller lesion is identified within the lateral aspect of segment 7. Imaging findings are compatible with either primary neoplasm of the liver such as cholangiocarcinoma or metastatic disease. The dominant mass should be easily amendable to image guided percutaneous biopsy. Additionally, consider further investigation with PET-CT to assess for distant metastatic disease. 2. Small pleural effusions. Electronically Signed   By: Kerby Moors M.D.   On: 01/10/2018 10:25   US Biopsy (  liver)  Result Date: 01/11/2018 INDICATION: Heterogeneous mass versus abscess of the liver with liquefied component. Presentation with abdominal pain and sepsis with positive blood cultures. EXAM: 1. ULTRASOUND-GUIDED CORE BIOPSY LIVER 2. ULTRASOUND GUIDED PERCUTANEOUS CATHETER DRAINAGE OF LIVER MEDICATIONS: The patient is currently admitted to the hospital and receiving intravenous antibiotics. The antibiotics were administered within an appropriate time frame prior to the initiation of the procedure. ANESTHESIA/SEDATION: Fentanyl 75 mcg IV; Versed 1.5 mg IV Moderate Sedation Time:  31 minutes. The patient was continuously monitored during the procedure by the interventional radiology nurse under my direct supervision. COMPLICATIONS: None immediate. PROCEDURE: Informed written consent was obtained from the patient after a thorough discussion of the procedural risks, benefits and  alternatives. All questions were addressed. Maximal Sterile Barrier Technique was utilized including caps, mask, sterile gowns, sterile gloves, sterile drape, hand hygiene and skin antiseptic. A timeout was performed prior to the initiation of the procedure. Ultrasound was performed of the liver. Under ultrasound guidance, a 17 gauge needle was advanced into the right lobe. 18 gauge core biopsy samples were obtained with 3 samples submitted in formalin. The 17 gauge needle was then advanced into a liquified component. Aspiration of fluid was performed and sample sent for both culture analysis and cytology. A guidewire was advanced through the needle. The tract was dilated and a 10 French percutaneous drainage catheter advanced. Catheter position was confirmed by ultrasound. The catheter was connected to a suction bulb. The drainage catheter was secured at the skin with a Prolene retention suture and adhesive StatLock device. FINDINGS: Large heterogeneous abnormality of the liver is present with ill-defined borders. The area of parenchymal abnormality spans at least an area 11 cm. The echogenic rim surrounding a liquified component was sampled with tissue biopsy. Aspiration at the level of the liquefied component yielded bloody and turbid fluid. A 10 French drain was placed in this collection which is draining bloody fluid. IMPRESSION: Ultrasound-guided biopsy of the liver lesion along the edge of the liquefied component followed by aspiration of the liquefied component yielding bloody and turbid fluid which was sent for culture analysis and cytology. Additional placement of a 10 French percutaneous drainage catheter into the liquefied component which was attached to suction bulb drainage. Electronically Signed   By: Aletta Edouard M.D.   On: 01/11/2018 17:15   Dg Chest Port 1 View  Result Date: 01/11/2018 CLINICAL DATA:  Dyspnea. EXAM: PORTABLE CHEST 1 VIEW COMPARISON:  Radiograph of January 09, 2018. FINDINGS:  Stable cardiomediastinal silhouette. No pneumothorax is noted. Left lung is clear. Right-sided PICC line is noted with distal tip in expected position of SVC. Mild right basilar opacity is noted concerning for atelectasis or infiltrate with associated pleural effusion. Bony thorax is unremarkable. IMPRESSION: Interval development of mild right basilar opacity concerning for atelectasis or infiltrate with possible associated pleural effusion. Interval placement of right-sided PICC line with distal tip in expected position of SVC. Electronically Signed   By: Marijo Conception, M.D.   On: 01/11/2018 15:27   Dg Chest Port 1 View  Result Date: 01/09/2018 CLINICAL DATA:  Fever, nausea and body aches for 6 days. EXAM: PORTABLE CHEST 1 VIEW COMPARISON:  January 09, 2018 11:20 a.m. FINDINGS: The mediastinal contour is normal. The heart size is mildly enlarged. The lung volumes are low. There is no focal pneumonia, pulmonary edema, or pleural effusion. The visualized skeletal structures are unremarkable. IMPRESSION: Mild enlarged cardiac silhouette. Low lung volumes. No focal pneumonia or frank pulmonary edema is  noted. Electronically Signed   By: Abelardo Diesel M.D.   On: 01/09/2018 16:10   [place] Ekg Site Rite  Result Date: 01/11/2018 If Site Rite image not attached, placement could not be confirmed due to current cardiac rhythm.  Echocardiogram 8/10 LV EF: 55% -   60%  ------------------------------------------------------------------- Indications:      Bacteremia 790.7.  ------------------------------------------------------------------- History:   PMH:  Sepsis.  Fever.  Atrial fibrillation.  Risk factors:  Hypertension.  ------------------------------------------------------------------- Study Conclusions  - Left ventricle: The cavity size was normal. There was mild focal   basal hypertrophy of the septum. Systolic function was normal.   The estimated ejection fraction was in the range of 55% to 60%.    Wall motion was normal; there were no regional wall motion   abnormalities. Doppler parameters are consistent with abnormal   left ventricular relaxation (grade 1 diastolic dysfunction). - Pulmonary arteries: PA peak pressure: 35 mm Hg (S).    Subjective: No new complaints.  No fever, respiratory distress.  Abdominal pain well controlled with oral pain medications.  Discharge Exam: Vitals:   01/24/18 0431 01/24/18 0800  BP: (!) 168/87 (!) 168/83  Pulse: 94 90  Resp: 17   Temp: 98.8 F (37.1 C)   SpO2: 93%    Vitals:   01/23/18 1218 01/23/18 1934 01/24/18 0431 01/24/18 0800  BP: (!) 155/67 (!) 153/77 (!) 168/87 (!) 168/83  Pulse: 95 100 94 90  Resp: 16 18 17    Temp: 98.5 F (36.9 C) 98.7 F (37.1 C) 98.8 F (37.1 C)   TempSrc: Oral Oral Oral   SpO2: 97% 94% 93%   Weight:   97 kg   Height:        General: Pt is alert, awake, not in acute distress, sitting in recliner. Abdomen: No distension, ascites Extremities: moderate edema in legs, no cyanosis Neuro: EOMI, moves extremities with globally weak strength, symmetric coordination, speech fluent Psych: Oriented to situation, attention normal, affect normal    The results of significant diagnostics from this hospitalization (including imaging, microbiology, ancillary and laboratory) are listed below for reference.     Microbiology: No results found for this or any previous visit (from the past 240 hour(s)).   Labs: BNP (last 3 results) No results for input(s): BNP in the last 8760 hours. Basic Metabolic Panel: Recent Labs  Lab 01/17/18 1216  01/20/18 0445 01/21/18 0230 01/22/18 0500 01/23/18 0430 01/24/18 0510  NA  --    < > 144 142 140 140 140  K  --    < > 4.2 3.9 3.6 3.5 3.2*  CL  --    < > 108 106 108 108 106  CO2  --    < > 30 28 29 28 29   GLUCOSE  --    < > 126* 100* 93 98 100*  BUN  --    < > 40* 28* 21 15 12   CREATININE  --    < > 2.03* 1.59* 1.56* 1.38* 1.23*  CALCIUM  --    < > 7.5* 7.5* 7.0*  7.1* 7.4*  MG 2.2  --   --   --   --   --   --   PHOS  --    < > 3.3 3.0 3.1 3.2 2.9   < > = values in this interval not displayed.   Liver Function Tests: Recent Labs  Lab 01/20/18 0445 01/21/18 0230 01/22/18 0500 01/23/18 0430 01/24/18 0510  ALBUMIN 1.9* 2.0* 1.8*  1.9* 2.0*   No results for input(s): LIPASE, AMYLASE in the last 168 hours. No results for input(s): AMMONIA in the last 168 hours. CBC: Recent Labs  Lab 01/20/18 0445 01/20/18 1442 01/21/18 0230 01/22/18 0500 01/23/18 0430 01/24/18 0500  WBC 25.6*  --  22.4* 17.6* 15.3* 11.9*  HGB 6.7* 7.6* 8.3* 7.4* 7.5* 7.9*  HCT 19.9* 22.9* 25.2* 22.9* 23.2* 24.9*  MCV 86.5  --  87.5 88.4 89.2 90.5  PLT 352  --  389 415* 443* 428*   Cardiac Enzymes: No results for input(s): CKTOTAL, CKMB, CKMBINDEX, TROPONINI in the last 168 hours. BNP: Invalid input(s): POCBNP CBG: No results for input(s): GLUCAP in the last 168 hours. D-Dimer No results for input(s): DDIMER in the last 72 hours. Hgb A1c No results for input(s): HGBA1C in the last 72 hours. Lipid Profile No results for input(s): CHOL, HDL, LDLCALC, TRIG, CHOLHDL, LDLDIRECT in the last 72 hours. Thyroid function studies No results for input(s): TSH, T4TOTAL, T3FREE, THYROIDAB in the last 72 hours.  Invalid input(s): FREET3 Anemia work up No results for input(s): VITAMINB12, FOLATE, FERRITIN, TIBC, IRON, RETICCTPCT in the last 72 hours. Urinalysis    Component Value Date/Time   COLORURINE YELLOW 01/14/2018 1300   APPEARANCEUR HAZY (A) 01/14/2018 1300   LABSPEC 1.010 01/14/2018 1300   PHURINE 5.0 01/14/2018 1300   GLUCOSEU NEGATIVE 01/14/2018 1300   HGBUR SMALL (A) 01/14/2018 1300   BILIRUBINUR NEGATIVE 01/14/2018 1300   KETONESUR NEGATIVE 01/14/2018 1300   PROTEINUR NEGATIVE 01/14/2018 1300   NITRITE NEGATIVE 01/14/2018 1300   LEUKOCYTESUR NEGATIVE 01/14/2018 1300   Sepsis Labs Invalid input(s): PROCALCITONIN,  WBC,  LACTICIDVEN Microbiology No  results found for this or any previous visit (from the past 240 hour(s)).   Time coordinating discharge: 40 minutes The [place] controlled substances registry was reviewed for this patient prior to filling the <5 days supply controlled substances script.      SIGNED:   Edwin Dada, MD  [facility] 01/24/2018, 8:29 AM

## 2018-01-24 NOTE — Progress Notes (Signed)
[facility] is prepared for DC home today.  Home IV ABX are already in the home from delivery on Sunday when pt was planned for DC at that time.    [facility] HH RN will see pt at home for admission to care tomorrow between 10-12 PM and to support daughter with first home dose. Daughter has been taught IV ABX administration while in the hospital for independence at home.  If patient discharges after hours, please call [phone].   [name] 01/24/2018, 8:59 AM

## 2018-01-24 NOTE — Progress Notes (Signed)
Discharge instructions reviewed with patient and her daughter. Reviewed JP drain care and flushing and daughter able to perform. Dressing to drain changed and as well and daughter proficient. Patient and daughter knowledgeable regarding PICC and JP s/s infection and other symptoms to report. Patient in stable condition and discharged via wheelchair to family vehicle.

## 2018-01-24 NOTE — Progress Notes (Signed)
Patient ID: Lisa Bauer, female   DOB: Mar 01, 1956, 62 y.o.   MRN: [id number]         [facility]  Date of Admission:  01/09/2018    Total days of antibiotics 16        Day 12 ceftriaxone         ASSESSMENT: She is having very slow improvement on therapy for a large multiloculated polymicrobial, streptococcal liver abscess.  She is being discharged home today on ceftriaxone.  She will need follow-up in the IR drain clinic.  She has follow-up in our clinic on 02/07/2018.  PLAN: 1. Discharge home on ceftriaxone 2. Follow-up in our clinic 02/07/2018  Principal Problem:   Hepatic abscess Active Problems:   Bacteremia due to Streptococcus   ARF (acute renal failure) (HCC)   Severe sepsis with septic shock (HCC)   Elevated liver function tests   Anemia   Scheduled Meds: . sodium chloride   Intravenous Once  . amLODipine  5 mg Oral Daily  . potassium chloride  10 mEq Oral Daily  . sodium chloride flush  5 mL Intracatheter Q8H   Continuous Infusions: . cefTRIAXone (ROCEPHIN)  IV 2 g (01/24/18 1111)   PRN Meds:.acetaminophen, HYDROmorphone (DILAUDID) injection, ondansetron (ZOFRAN) IV, oxyCODONE, oxyCODONE, sodium chloride flush   SUBJECTIVE: She is feeling a little bit better today.  Review of Systems: Review of Systems  Constitutional: Negative for chills, diaphoresis and fever.  Gastrointestinal: Positive for abdominal pain. Negative for constipation, diarrhea, nausea and vomiting.    No Known Allergies  OBJECTIVE: Vitals:   01/23/18 1218 01/23/18 1934 01/24/18 0431 01/24/18 0800  BP: (!) 155/67 (!) 153/77 (!) 168/87 (!) 168/83  Pulse: 95 100 94 90  Resp: 16 18 17    Temp: 98.5 F (36.9 C) 98.7 F (37.1 C) 98.8 F (37.1 C)   TempSrc: Oral Oral Oral   SpO2: 97% 94% 93%   Weight:   97 kg   Height:       Body mass index is 29 kg/m.  Physical Exam  Constitutional:  She is dressed and ready to go home.  Her daughter is with her.    Abdominal: Soft. There is tenderness.  Only a small amount of bloody drainage in her drain bulb. 5 cc of drainage was recorded yesterday.    Lab Results Lab Results  Component Value Date   WBC 11.9 (H) 01/24/2018   HGB 7.9 (L) 01/24/2018   HCT 24.9 (L) 01/24/2018   MCV 90.5 01/24/2018   PLT 428 (H) 01/24/2018    Lab Results  Component Value Date   CREATININE 1.23 (H) 01/24/2018   BUN 12 01/24/2018   NA 140 01/24/2018   K 3.2 (L) 01/24/2018   CL 106 01/24/2018   CO2 29 01/24/2018    Lab Results  Component Value Date   ALT 335 (H) 01/15/2018   AST 150 (H) 01/15/2018   ALKPHOS 259 (H) 01/15/2018   BILITOT 1.5 (H) 01/15/2018     Microbiology: No results found for this or any previous visit (from the past 240 hour(s)).  Michel Bickers, MD [facility] [facility] [phone] pager   [phone] cell 01/24/2018, 2:10 PM

## 2018-01-25 ENCOUNTER — Emergency Department (HOSPITAL_COMMUNITY)

## 2018-01-25 ENCOUNTER — Encounter (HOSPITAL_COMMUNITY): Payer: Self-pay | Admitting: Emergency Medicine

## 2018-01-25 ENCOUNTER — Other Ambulatory Visit: Payer: Self-pay

## 2018-01-25 ENCOUNTER — Emergency Department (HOSPITAL_COMMUNITY)
Admission: EM | Admit: 2018-01-25 | Discharge: 2018-01-26 | Disposition: A | Discharge status: Home / Self Care | Attending: Emergency Medicine | Admitting: Emergency Medicine

## 2018-01-25 DIAGNOSIS — R0602 Shortness of breath: Secondary | ICD-10-CM | POA: Diagnosis not present

## 2018-01-25 DIAGNOSIS — I10 Essential (primary) hypertension: Secondary | ICD-10-CM | POA: Diagnosis not present

## 2018-01-25 HISTORY — DX: Abscess of liver: K75.0

## 2018-01-25 LAB — CBC WITH DIFFERENTIAL/PLATELET
ABS IMMATURE GRANULOCYTES: 0.1 10*3/uL (ref 0.0–0.1)
Basophils Absolute: 0.1 10*3/uL (ref 0.0–0.1)
Basophils Relative: 1 %
Eosinophils Absolute: 0.1 10*3/uL (ref 0.0–0.7)
Eosinophils Relative: 1 %
HCT: 26.4 % — ABNORMAL LOW (ref 36.0–46.0)
HEMOGLOBIN: 8.3 g/dL — AB (ref 12.0–15.0)
Immature Granulocytes: 1 %
LYMPHS PCT: 12 %
Lymphs Abs: 1.3 10*3/uL (ref 0.7–4.0)
MCH: 29 pg (ref 26.0–34.0)
MCHC: 31.4 g/dL (ref 30.0–36.0)
MCV: 92.3 fL (ref 78.0–100.0)
MONO ABS: 1 10*3/uL (ref 0.1–1.0)
MONOS PCT: 9 %
NEUTROS ABS: 8.1 10*3/uL — AB (ref 1.7–7.7)
Neutrophils Relative %: 76 %
Platelets: 395 10*3/uL (ref 150–400)
RBC: 2.86 MIL/uL — ABNORMAL LOW (ref 3.87–5.11)
RDW: 16.6 % — ABNORMAL HIGH (ref 11.5–15.5)
WBC: 10.6 10*3/uL — ABNORMAL HIGH (ref 4.0–10.5)

## 2018-01-25 LAB — COMPREHENSIVE METABOLIC PANEL
ALBUMIN: 2.2 g/dL — AB (ref 3.5–5.0)
ALK PHOS: 214 U/L — AB (ref 38–126)
ALT: 36 U/L (ref 0–44)
ANION GAP: 8 (ref 5–15)
AST: 35 U/L (ref 15–41)
BUN: 10 mg/dL (ref 8–23)
CO2: 25 mmol/L (ref 22–32)
Calcium: 7.7 mg/dL — ABNORMAL LOW (ref 8.9–10.3)
Chloride: 104 mmol/L (ref 98–111)
Creatinine, Ser: 1.07 mg/dL — ABNORMAL HIGH (ref 0.44–1.00)
GFR calc Af Amer: >60 mL/min (ref >60)
GFR calc non Af Amer: 54 mL/min — ABNORMAL LOW (ref >60)
GLUCOSE: 90 mg/dL (ref 70–99)
POTASSIUM: 3.4 mmol/L — AB (ref 3.5–5.1)
Sodium: 137 mmol/L (ref 135–145)
Total Bilirubin: 1 mg/dL (ref 0.3–1.2)
Total Protein: 6.5 g/dL (ref 6.5–8.1)

## 2018-01-25 LAB — BRAIN NATRIURETIC PEPTIDE: B NATRIURETIC PEPTIDE 5: 287.5 pg/mL — AB (ref 0.0–100.0)

## 2018-01-25 NOTE — ED Notes (Signed)
Pt ambulatory to and from hallway bathroom with only standby assist from family member.

## 2018-01-25 NOTE — ED Triage Notes (Signed)
Patient to ED c/o new onset low-grade fevers and hypertension today. She was discharged yesterday following admission for liver abscess - s/p surgery - currently has bulb drain. No notable redness or swelling noted around drain site. She also has a PICC line to RUA - no redness or swelling to that site as well. She also reports bilateral lower extremity edema - had it during her admission, but reports it is now worse. Pitting edema noted to feet/ankles and lower legs. She adds that she also has new shortness of breath with exertion. Denies CP, no N/V.

## 2018-01-25 NOTE — ED Provider Notes (Signed)
Patient placed in Quick Look pathway, seen and evaluated   Chief Complaint: Hypertension, leg swelling  HPI:   Patient has a drain and a picc line for a liver abscess.  Was discharged yesterday.  When she was in the hospital had some hypotension so lisinopril was stopped   Once better they started her on norvasc. Slight shortness of breath.  She thinks this is mostly new since discharge.    ROS: Low grade fever   Physical Exam:   Gen: No distress  Neuro: Awake and Alert  Skin: Warm    Focused Exam: Resting comfortably.    Initiation of care has begun. The patient has been counseled on the process, plan, and necessity for staying for the completion/evaluation, and the remainder of the medical screening examination    Ollen Gross 01/25/18 2101    Virgel Manifold, MD 01/31/18 1413

## 2018-01-26 NOTE — ED Provider Notes (Signed)
[facility] EMERGENCY DEPARTMENT Provider Note  CSN: [id number] Arrival date & time: 01/25/18  1728   History   Chief Complaint Chief Complaint  Patient presents with  . Fever  . Leg Swelling    HPI Lisa Bauer is a 62 y.o. female with a medical history of HTN and liver abscess who presented to the ED for elevated BP. Patient was discharged from [facility] yesterday 01/25/18 following an extended admission for liver abscess and bacteremia. She states that her home health and PT came to her home today and encouraged her to come to the ED because they were worried about her BP. She states that BP was ~180 to 190/80 at home today. She is unsure of what her normal BP is and states that she was started on a new BP medication prior to discharge. Patient denies any new physical complaints since discharge and states that she had been feeling well. Endorses mild dyspnea with exertion. Denies fever, chest pain, palpitations, leg swelling, abdominal pain, issues with liver drain or skin changes.  Additional history obtained by medical chart. Patient discharged on 01/24/18 following a 15 day admission for bacteremia and liver abscess. Patient's BP prior to discharge was high 160s/80s.  Past Medical History:  Diagnosis Date  . Hypertension   . Liver abscess 01/2018   bulb drain in place    Patient Active Problem List   Diagnosis Date Noted  . ARF (acute renal failure) ([place]) 01/11/2018  . Severe sepsis with septic shock ([place]) 01/11/2018  . Bacteremia due to Streptococcus 01/11/2018  . Elevated liver function tests 01/11/2018  . Anemia 01/11/2018  . Hepatic abscess 01/09/2018    History reviewed. No pertinent surgical history.   OB History   None      Home Medications    Prior to Admission medications   Medication Sig Start Date End Date Taking? Authorizing Provider  amLODipine (NORVASC) 5 MG tablet Take 1 tablet (5 mg total) by mouth daily. 01/24/18  Yes Danford,  Suann Larry, MD  cefTRIAXone (ROCEPHIN) IVPB Inject 2 g into the vein daily for 16 days. Indication:  Hepatic abscess/bacteremia Last Day of Therapy:  02/06/18 Labs - Once weekly:  CBC/D and BMP, Labs - Every other week:  ESR and CRP 01/21/18 02/06/18 Yes Danford, Suann Larry, MD  cholecalciferol (VITAMIN D) 1000 units tablet Take 1,000 Units by mouth at bedtime.    Yes [provider]  oxyCODONE (OXY IR/ROXICODONE) 5 MG immediate release tablet Take 1 tablet (5 mg total) by mouth 5 (five) times daily as needed for up to 5 days for severe pain or breakthrough pain. 01/24/18 01/29/18 Yes Danford, Suann Larry, MD  potassium chloride (K-DUR,KLOR-CON) 10 MEQ tablet Take 1 tablet (10 mEq total) by mouth daily. 01/24/18  Yes Danford, Suann Larry, MD  TURMERIC PO Take 2 capsules by mouth at bedtime.   Yes [provider]    Family History No family history on file.  Social History Social History   Tobacco Use  . Smoking status: Never Smoker  . Smokeless tobacco: Never Used  Substance Use Topics  . Alcohol use: No  . Drug use: No     Allergies   Patient has no known allergies.   Review of Systems Review of Systems  Constitutional: Negative for chills, diaphoresis, fatigue and fever.  HENT: Negative.   Eyes: Negative.   Respiratory: Positive for shortness of breath. Negative for cough and chest tightness.   Cardiovascular: Negative for chest pain,  palpitations and leg swelling.  Gastrointestinal: Negative for abdominal pain, constipation, diarrhea, nausea and vomiting.  Genitourinary: Negative.   Musculoskeletal: Negative.   Neurological: Negative.   Hematological: Negative.    Physical Exam Updated Vital Signs BP (!) 165/90   Pulse 89   Temp 99.8 F (37.7 C) (Oral)   Resp (!) 21   LMP  (LMP Unknown)   SpO2 96%   Physical Exam  Constitutional: She appears well-developed and well-nourished.  Cardiovascular: Normal rate, regular rhythm and normal heart  sounds.  No murmur heard. Pulses:      Dorsalis pedis pulses are 2+ on the right side, and 2+ on the left side.       Posterior tibial pulses are 2+ on the right side, and 2+ on the left side.  Pulmonary/Chest: Effort normal. No tachypnea. No respiratory distress. She has decreased breath sounds in the right middle field and the right lower field.  Abdominal: Soft. Normal appearance and bowel sounds are normal. There is no tenderness.  Liver drain in RUQ. Contents of bulb are bloody. No erythema, warmth or drainage coming from site.  Musculoskeletal: Normal range of motion.  1+ edema in bilateral lower extremities.  Skin: Skin is warm and intact. Capillary refill takes less than 2 seconds.  Psychiatric: She has a normal mood and affect. Her speech is normal and behavior is normal.  Nursing note and vitals reviewed.    ED Treatments / Results  Labs (all labs ordered are listed, but only abnormal results are displayed) Labs Reviewed  COMPREHENSIVE METABOLIC PANEL - Abnormal; Notable for the following components:      Result Value   Potassium 3.4 (*)    Creatinine, Ser 1.07 (*)    Calcium 7.7 (*)    Albumin 2.2 (*)    Alkaline Phosphatase 214 (*)    GFR calc non Af Amer 54 (*)    All other components within normal limits  CBC WITH DIFFERENTIAL/PLATELET - Abnormal; Notable for the following components:   WBC 10.6 (*)    RBC 2.86 (*)    Hemoglobin 8.3 (*)    HCT 26.4 (*)    RDW 16.6 (*)    Neutro Abs 8.1 (*)    All other components within normal limits  BRAIN NATRIURETIC PEPTIDE - Abnormal; Notable for the following components:   B Natriuretic Peptide 287.5 (*)    All other components within normal limits    EKG EKG Interpretation  Date/Time:  Friday January 25 2018 18:11:23 EDT Ventricular Rate:  96 PR Interval:  146 QRS Duration: 96 QT Interval:  358 QTC Calculation: 452 R Axis:   -19 Text Interpretation:  Normal sinus rhythm Incomplete right bundle branch block  Borderline ECG When compared with ECG of 01/09/2018, No significant change was found Confirmed by Delora Fuel ([id number]) on 01/25/2018 11:36:31 PM   Radiology Dg Chest 2 View  Result Date: 01/25/2018 CLINICAL DATA:  62 y/o  F; low-grade fevers.  Shortness of breath. EXAM: CHEST - 2 VIEW COMPARISON:  01/11/2018 chest radiograph FINDINGS: Increased large right pleural effusion and opacification of right lung base. Stable normal cardiac silhouette given projection and technique. Stable right PICC line tip projects over the mid SVC. Right upper quadrant surgical drain noted. No acute osseous abnormality. IMPRESSION: Increased large right pleural effusion. Associated right basilar opacity may represent pneumonia or atelectasis. Electronically Signed   By: Kristine Garbe M.D.   On: 01/25/2018 19:35    Procedures Procedures (including critical care time)  Medications Ordered in ED Medications - No data to display   Initial Impression / Assessment and Plan / ED Course  Triage vital signs and the nursing notes have been reviewed.  Pertinent labs & imaging results that were available during care of the patient were reviewed and considered in medical decision making (see chart for details).  Patient presents afebrile and is generally well appearing. She reports coming to the ED because she was encouraged to by her home health aide and PT because of elevated BP readings. She states that she was overall feeling well and would not have come to the ED today had it not been for them. Physical exam is remarkable for decreased breath sounds on the right which is consistent with pleural effusion seen on imaging during the patient's admission. Remaining exam is unremarkable. She is not in respiratory distress and has oxygen saturations > 95%. When compared with discharge summary, there appears to be no acute change in patient's clinical situation that would warrant admission or further work-up  today.  Clinical Course as of Jan 27 243  Fri Jan 25, 2018  2226 CXR shows right pleural effusion. Pleural effusion seen on last chest imaging on 01/21/18 during admission.   [GM]  2228 CBC and BMP are slightly improved from blood work conducted prior to discharge yesterday 01/24/18.   [GM]  2303 Case discussed with Dr. Fredia Sorrow. Given there have been no acute changes in patient's physical condition or labs since discharge and BP has been stable and consistent with pre-discharge BP readings, patient is stable for discharge. Thorough education provided to patient and family on follow-up precautions including BP of >180/110, new s/s, worsening of dyspnea or changes in liver drain output.   [GM]    Clinical Course User Index [GM] Sharalyn Lomba, Jonelle Sports, PA-C    Final Clinical Impressions(s) / ED Diagnoses  1. Hypertension. Continue home dose of Norvasc. Follow-up with PCP. Return precautions discussed with patient and family.  Dispo: Home. After thorough clinical evaluation, this patient is determined to be medically stable and can be safely discharged with the previously mentioned treatment and/or outpatient follow-up/referral(s). At this time, there are no other apparent medical conditions that require further screening, evaluation or treatment.   Final diagnoses:  Essential hypertension    ED Discharge Orders    None        Romie Jumper, [place] 01/26/18 0244    Fredia Sorrow, MD 02/03/18 [phone]

## 2018-01-26 NOTE — Discharge Instructions (Addendum)
I am glad that you get to go home today!  Continue to take the Norvasc as prescribed. It will take 1-2 weeks to have an effect on your blood pressure. Be sure to follow-up with your PCP next week to discuss this further.  Use the incentive spirometer 2-3 times a day to help with your shortness of breath.  Please do not be afraid to come back to the ED if you begin to feel worse over the weekend.  Thank you for allowing me to take care of you today! I wish you the best on your recovery.

## 2018-01-26 NOTE — ED Notes (Signed)
Reviewed d/c instructions with pt, who verbalized understanding and had no outstanding questions. Pt departed in NAD, escorted in wheelchair by family.

## 2018-01-29 ENCOUNTER — Telehealth: Payer: Self-pay

## 2018-02-01 ENCOUNTER — Other Ambulatory Visit: Payer: Self-pay | Admitting: Pharmacist

## 2018-02-01 NOTE — Progress Notes (Signed)
[facility] pharmacy lab review  

## 2018-02-05 ENCOUNTER — Telehealth: Payer: Self-pay

## 2018-02-05 ENCOUNTER — Ambulatory Visit
Admission: RE | Admit: 2018-02-05 | Discharge: 2018-02-05 | Disposition: A | Discharge status: Home / Self Care | Source: Ambulatory Visit | Attending: Physician Assistant | Admitting: Physician Assistant

## 2018-02-05 ENCOUNTER — Other Ambulatory Visit (HOSPITAL_COMMUNITY): Payer: Self-pay | Admitting: Interventional Radiology

## 2018-02-05 ENCOUNTER — Ambulatory Visit
Admission: RE | Admit: 2018-02-05 | Discharge: 2018-02-05 | Disposition: A | Discharge status: Home / Self Care | Source: Ambulatory Visit | Attending: Internal Medicine | Admitting: Internal Medicine

## 2018-02-05 ENCOUNTER — Telehealth: Payer: Self-pay | Admitting: Pharmacist

## 2018-02-05 DIAGNOSIS — M5430 Sciatica, unspecified side: Secondary | ICD-10-CM | POA: Insufficient documentation

## 2018-02-05 DIAGNOSIS — K750 Abscess of liver: Secondary | ICD-10-CM

## 2018-02-05 DIAGNOSIS — J90 Pleural effusion, not elsewhere classified: Secondary | ICD-10-CM

## 2018-02-05 DIAGNOSIS — D259 Leiomyoma of uterus, unspecified: Secondary | ICD-10-CM | POA: Insufficient documentation

## 2018-02-05 DIAGNOSIS — I10 Essential (primary) hypertension: Secondary | ICD-10-CM | POA: Insufficient documentation

## 2018-02-05 HISTORY — PX: IR RADIOLOGIST EVAL & MGMT: IMG5224

## 2018-02-05 NOTE — Progress Notes (Signed)
Referring Physician(s): Dr. Megan Salon  Chief Complaint: The patient is seen in follow up today s/p hepatic abscess  History of present illness: Lisa Bauer is a 62 year old female with recent hospitalization 8/7-8/22 for hepatic abscess. Initial imaging was concerning for a possible liver lesion, however at the time of biopsy on 01/11/18 frank pus was aspirated and a drainage cathter was left in place.  Patient subsequently developed a hemorrhage within with abscess cavity.  A second drain was requested and considered, however with adequate output and improvement with blood transfusions and antibiotics, the patient was ultimately able to discharge home with single hepatic drain in place. She returns to Interventional Radiology clinic today for follow-up. She reports her symptoms have continued to improve.  She completed antibiotic therapy of Ceftriaxone IV on Sunday 9/2.  Her PICC line has since been removed. She denies fever, chills, abdominal pain, nausea, vomiting.  She is accompanied by her daughter today.   Past Medical History:  Diagnosis Date  . Hypertension   . Liver abscess 01/2018   bulb drain in place    Past Surgical History:  Procedure Laterality Date  . IR RADIOLOGIST EVAL & MGMT  02/05/2018    Allergies: Patient has no known allergies.  Medications: Prior to Admission medications   Medication Sig Start Date End Date Taking? Authorizing Provider  amLODipine (NORVASC) 5 MG tablet Take 1 tablet (5 mg total) by mouth daily. 01/24/18   Danford, Suann Larry, MD  cefTRIAXone (ROCEPHIN) IVPB Inject 2 g into the vein daily for 16 days. Indication:  Hepatic abscess/bacteremia Last Day of Therapy:  02/06/18 Labs - Once weekly:  CBC/D and BMP, Labs - Every other week:  ESR and CRP 01/21/18 02/06/18  Danford, Suann Larry, MD  cholecalciferol (VITAMIN D) 1000 units tablet Take 1,000 Units by mouth at bedtime.     [provider]  potassium chloride (K-DUR,KLOR-CON) 10 MEQ  tablet Take 1 tablet (10 mEq total) by mouth daily. 01/24/18   Danford, Suann Larry, MD  TURMERIC PO Take 2 capsules by mouth at bedtime.    [provider]     No family history on file.  Social History   Socioeconomic History  . Marital status: Married    Spouse name: Not on file  . Number of children: Not on file  . Years of education: Not on file  . Highest education level: Not on file  Occupational History  . Not on file  Social Needs  . Financial resource strain: Not on file  . Food insecurity:    Worry: Not on file    Inability: Not on file  . Transportation needs:    Medical: Not on file    Non-medical: Not on file  Tobacco Use  . Smoking status: Never Smoker  . Smokeless tobacco: Never Used  Substance and Sexual Activity  . Alcohol use: No  . Drug use: No  . Sexual activity: Not on file  Lifestyle  . Physical activity:    Days per week: Not on file    Minutes per session: Not on file  . Stress: Not on file  Relationships  . Social connections:    Talks on phone: Not on file    Gets together: Not on file    Attends religious service: Not on file    Active member of club or organization: Not on file    Attends meetings of clubs or organizations: Not on file    Relationship status: Not on  file  Other Topics Concern  . Not on file  Social History Narrative  . Not on file    Vital Signs: BP (!) 186/88 (BP Location: Left Arm, Patient Position: Sitting, Cuff Size: Normal)   Pulse 80   Temp 98.4 F (36.9 C)   Resp 14   LMP  (LMP Unknown)   SpO2 98%   Physical Exam  Imaging: Ct Abdomen Pelvis Wo Contrast  Result Date: 02/05/2018 CLINICAL DATA:  History of hepatic abscess post ultrasound-guided biopsy and placement of percutaneous drainage catheter by Dr. Kathlene Cote on 01/11/2018. Patient presents today to the interventional radiology drain Clinic for drainage catheter evaluation and management. Patient reports only approximately 11 cc of blood  tinged output from the percutaneous drainage catheter. Patient completed her prescribed course of intravenous antibiotics this past weekend. She denies recurrence of abdominal pain, fever or chills. EXAM: CT ABDOMEN AND PELVIS WITHOUT CONTRAST TECHNIQUE: Multidetector CT imaging of the abdomen and pelvis was performed following the standard protocol without IV contrast. COMPARISON:  CT abdomen and pelvis - 01/20/2018; 01/09/2018; abdominal MRI - 01/09/2018; ultrasound-guided hepatic biopsy and drainage catheter placement-01/11/2018; chest radiograph - 01/25/2018 FINDINGS: The lack of intravenous contrast limits the ability to evaluate solid abdominal organs. Lower chest: Limited visualization of the lower thorax demonstrates moderate to large sized right-sided pleural effusion, similar to chest radiograph performed 01/25/2018. Atelectasis/collapse of the majority of the imaged right lower lobe with associated air bronchograms. No pneumothorax. Normal heart size.  No pericardial effusion. Hepatobiliary: Normal hepatic contour. Unchanged positioning of percutaneous drainage catheter with end coiled and locked within the cranial central aspect of residual ill-defined abscess involving the majority of the anterior segment of the right lobe of the liver. Dominant component of the abscess is grossly unchanged measuring approximately 10.1 x 6.8 cm (image 25, series 2). Previously identified ill-defined component within the lateral segment of left lobe of the liver appears to have decreased in size in the interval though evaluation degraded secondary lack of intravenous contrast. Ill-defined approximately 5.6 x 3.7 cm hypoattenuating lesion/collection within the medial segment of left lobe of the liver has minimally decreased in size the interval, currently measuring 7.3 x 4.4 cm, as has the approximately 3.1 x 2.0 cm hypoattenuating lesion/collection within the posterior segment of the right lobe of the liver (image 25,  series 2), previously, 4.4 x 2.5 cm. No definitive new intrahepatic lesions/collections. Interval increase in size complex subcapsular fluid collection with dominant component measuring at least 21.5 x 3.9 cm (coronal image 63, series 4), previously, 21.9 x 2.9 cm, nonspecific though favored to represent an evolving subcapsular hematoma. No definite ascites. Normal noncontrast appearance of the gallbladder given degree distention. No radiopaque gallstones. Pancreas: Normal noncontrast appearance of the pancreas Spleen: Normal noncontrast appearance of the spleen Adrenals/Urinary Tract: Normal noncontrast appearance the bilateral kidneys. No renal stones. No renal stones are seen along expected course of either ureter or the urinary bladder. There is mild diffuse thickening the urinary bladder wall presumably secondary to underdistention. No urine obstruction or perinephric stranding. Normal appearance of the bilateral adrenal glands. Stomach/Bowel: Moderate colonic stool burden without evidence of enteric obstruction. Normal noncontrast appearance of the terminal ileum and appendix. No pneumoperitoneum, pneumatosis or portal venous gas. Vascular/Lymphatic: Normal caliber of the abdominal aorta. No definitive bulky retroperitoneal, mesenteric, pelvic or inguinal lymphadenopathy on this noncontrast examination. Reproductive: Peripherally calcified degenerating fibroids with dominant fibroid within the left side of the uterine fundus measuring approximately 2.6 x 2.5 cm (image 63, series  2). Dystrophic calcifications within the right adnexa. There is a small amount of presumably physiologic/reactive fluid in the pelvic cul-de-sac. Other: Minimal amount of subcutaneous edema about the midline of the low back. Musculoskeletal: No acute or aggressive osseous abnormalities. There is increased sclerosis of the osseous structures likely compatible with renal osteodystrophy. Mild grade 1 anterolisthesis of L4 upon L5 without  associated pars defects. Mild-to-moderate multilevel lumbar spine DDD, worse at L4-L5 and L5-S1 with disc space height loss, endplate irregularity and sclerosis. IMPRESSION: 1. Unchanged positioning percutaneous hepatic abscess drainage catheter without significant change in size of dominant ill-defined hepatic abscess. Additional scattered ill-defined hypoechoic attenuating hepatic lesions/fluid collections are unchanged to decreased in size as detailed above, though suboptimally evaluated on this noncontrast examination. 2. Interval increase in size of complex subcapsular fluid collection, nonspecific though suspected to represent an evolving subcapsular hematoma. 3. Moderate to large sized right-sided pleural effusion with associated atelectasis/collapse of the majority of the imaged right lower lobe. PLAN: - Given lack of significant output from the percutaneous drainage catheter, the lack of substantial change in size of the dominant drained ill-defined hypoattenuating hepatic abscess as well as the potentially associated moderate to large size right-sided effusion and increasing subcapsular fluid collection/hematoma, the decision was made to remove the hepatic abscess drainage catheter. This was done at the patient's bedside without incident. - Patient has been scheduled to undergo an ultrasound-guided right-sided thoracentesis tomorrow. Aspirated sample will be sent for Gram stain analysis. - Patient to follow-up with infectious disease in 2 days (September 5th) for additional management. Above discussed with Dr. Megan Salon (ID). Electronically Signed   By: Sandi Mariscal M.D.   On: 02/05/2018 14:15   Ir Radiologist Eval & Mgmt  Result Date: 02/05/2018 Please refer to notes tab for details about interventional procedure. (Op Note)   Labs:  CBC: Recent Labs    01/22/18 0500 01/23/18 0430 01/24/18 0500 01/25/18 1819  WBC 17.6* 15.3* 11.9* 10.6*  HGB 7.4* 7.5* 7.9* 8.3*  HCT 22.9* 23.2* 24.9* 26.4*    PLT 415* 443* 428* 395    COAGS: Recent Labs    01/09/18 1151  INR 1.24    BMP: Recent Labs    01/22/18 0500 01/23/18 0430 01/24/18 0510 01/25/18 1819  NA 140 140 140 137  K 3.6 3.5 3.2* 3.4*  CL 108 108 106 104  CO2 _0 GLUCOSE 93 98 100* 90  BUN _1 CALCIUM 7.0* 7.1* 7.4* 7.7*  CREATININE 1.56* 1.38* 1.23* 1.07*  GFRNONAA 35* 40* 46* 54*  GFRAA 40* 46* 53* >60    LIVER FUNCTION TESTS: Recent Labs    01/13/18 0400 01/14/18 0350 01/15/18 0500  01/22/18 0500 01/23/18 0430 01/24/18 0510 01/25/18 1819  BILITOT 2.0* 1.9* 1.5*  --   --   --   --  1.0  AST 554* 260* 150*  --   --   --   --  35  ALT 549* 466* 335*  --   --   --   --  36  ALKPHOS 148* 193* 259*  --   --   --   --  214*  PROT 4.6* 5.2* 5.5*  --   --   --   --  6.5  ALBUMIN 1.8* 1.8* 1.8*   < > 1.8* 1.9* 2.0* 2.2*   < > = values in this interval not displayed.    Assessment: Hepatic Abscess Patient admitted to [facility] 01/09/18 with general malaise and  abdominal pain was found to have a hepatic abscess.  Patient underwent percutaneous drain placement 01/11/18.  She was ultimately discharged home with drain in place after an extended hospitalization due to septic shock and hematoma/hemorrhage within the abscess cavity.  She returns to IR clinic for follow-up.  Her drain remains in place without issue. Patient and daughter report a small amount of blood-tinged drainage daily.   CT Abdomen/Pelvis obtained today is reviewed by Dr. Pascal Lux who has personally seen and consulted with the patient regarding her imaging and progress.  Dr. Pascal Lux has spoken with ID regarding patient follow-up and ongoing monitoring.  Drain removed by Dr. Pascal Lux today without complication.  Patient is noted to have a right pleural effusion potentially associated with the presence of the drainage catheter.  She has been scheduled for an outpatient thoracentesis tomorrow morning at [facility].  Patient and daughter aware of plans for  procedure tomorrow.   No follow-up needed with IR at this time.    Signed: Docia Barrier, PA 02/05/2018, 2:42 PM   Please refer to Dr. Pascal Lux attestation of this note for management and plan.

## 2018-02-05 NOTE — Telephone Encounter (Signed)
Called Coretta at [facility] to verify patient's stop date.  Orders in epic state she was to continue ceftriaxone until 9/4 but Coretta states she stopped on 8/28 and her PICC was pulled. Unsure where the orders came from.  Will route to Dr. Megan Salon as patient was seen today and her abscess is not getting better.

## 2018-02-05 NOTE — Telephone Encounter (Signed)
Returned patient's daughter's Rachyl Wuebker) call from Friday, 02/01/18 @ 1255 stating a home health nurse noticed some "greenish drainage from around drain site."  [place] wanted to know what to do about this over the weekend before Emmie's appointment with [place] today, 02/05/18, @ 1215.  I left Sharonica a voice mail message @ [phone] stating our office isn't open on Fridays and I was just now hearing the message they left.  Told her we would address this issue during today's office visit.  Gypsy Lore, RN

## 2018-02-06 ENCOUNTER — Ambulatory Visit (HOSPITAL_COMMUNITY)
Admission: RE | Admit: 2018-02-06 | Discharge: 2018-02-06 | Disposition: A | Discharge status: Home / Self Care | Source: Ambulatory Visit | Attending: Interventional Radiology | Admitting: Interventional Radiology

## 2018-02-06 ENCOUNTER — Ambulatory Visit (HOSPITAL_COMMUNITY)
Admission: RE | Admit: 2018-02-06 | Discharge: 2018-02-06 | Disposition: A | Discharge status: Home / Self Care | Source: Ambulatory Visit | Attending: Physician Assistant | Admitting: Physician Assistant

## 2018-02-06 ENCOUNTER — Encounter (HOSPITAL_COMMUNITY): Payer: Self-pay | Admitting: Physician Assistant

## 2018-02-06 DIAGNOSIS — Z9889 Other specified postprocedural states: Secondary | ICD-10-CM | POA: Insufficient documentation

## 2018-02-06 DIAGNOSIS — J90 Pleural effusion, not elsewhere classified: Secondary | ICD-10-CM

## 2018-02-06 DIAGNOSIS — J9811 Atelectasis: Secondary | ICD-10-CM | POA: Insufficient documentation

## 2018-02-06 HISTORY — PX: IR THORACENTESIS ASP PLEURAL SPACE W/IMG GUIDE: IMG5380

## 2018-02-06 LAB — GRAM STAIN

## 2018-02-06 MED ORDER — LIDOCAINE HCL (PF) 2 % IJ SOLN
INTRAMUSCULAR | Status: AC
  Filled 2018-02-06: qty 20

## 2018-02-06 MED ORDER — LIDOCAINE HCL (PF) 2 % IJ SOLN
INTRAMUSCULAR | Status: DC | PRN
  Administered 2018-02-06: 10 mL

## 2018-02-06 NOTE — Telephone Encounter (Signed)
Lisa Bauer is undergoing thoracentesis today and is scheduled to follow-up with Dr. Johnnye Sima in the morning.  I will let him decide if we need to replace the PICC and restart IV antibiotics.

## 2018-02-06 NOTE — Procedures (Signed)
PROCEDURE SUMMARY:  Successful US guided right thoracentesis. Yielded 1.7 liters of dark brown/bilious appearing fluid. Patient tolerated procedure well. No immediate complications.  Specimen was sent for labs.  Post procedure chest X-ray reveals no pneumothorax  Geri Hepler S Fernanda Twaddell PA-C 02/06/2018 1:29 PM

## 2018-02-07 ENCOUNTER — Encounter: Payer: Self-pay | Admitting: Infectious Diseases

## 2018-02-07 ENCOUNTER — Ambulatory Visit (INDEPENDENT_AMBULATORY_CARE_PROVIDER_SITE_OTHER): Admitting: Infectious Diseases

## 2018-02-07 DIAGNOSIS — K750 Abscess of liver: Secondary | ICD-10-CM | POA: Diagnosis not present

## 2018-02-07 MED ORDER — AMOXICILLIN-POT CLAVULANATE 875-125 MG PO TABS: 1.0000 | ORAL_TABLET | Freq: Two times a day (BID) | ORAL | 1 refills | Status: DC

## 2018-02-07 NOTE — Progress Notes (Signed)
   Subjective:    Patient ID: Lisa Bauer, female    DOB: [birth year], 62 y.o.   MRN: [id number]  HPI 62 yo F adm on 8-7 with n/v and fever. She was found to have a liver abscess, IR drain placed that grew Strep group F. Her BCx grew viridans strep. She was treated with vanco/zosyn --> zosyn --> ceftriaxone.  She completed 9-1. She was d/c home on 8-22.  She returned to hospital on 8-24 with fever. She returned home that day.  She had f/u with IR on 9-3- her repeat Ct showed drain in place in ill-defined hypoattenuated hepatic lesion.  Today she feels better. Was told that her scan "didn't look great".  She had thoracentesis yesterday. Her SOB is better and her sleep is better. She still has urge to cough.   Doing some ADLs but not quite back to normal.   Review of Systems  Constitutional: Positive for fatigue. Negative for chills and fever.  HENT: Positive for congestion.   Respiratory: Positive for cough and shortness of breath.   Gastrointestinal: Positive for abdominal pain.  Genitourinary: Negative for difficulty urinating.  Please see HPI. All other systems reviewed and negative.      Objective:   Physical Exam  Constitutional: She appears well-developed and well-nourished.  HENT:  Mouth/Throat: No oropharyngeal exudate.  Eyes: Pupils are equal, round, and reactive to light. EOM are normal.  Neck: Normal range of motion. Neck supple.  Cardiovascular: Normal rate, regular rhythm and normal heart sounds.  Pulmonary/Chest: Effort normal and breath sounds normal.  Abdominal: Soft. Bowel sounds are normal. She exhibits no distension. There is no tenderness. There is no guarding.  Musculoskeletal: She exhibits no edema.  Lymphadenopathy:    She has no cervical adenopathy.  Skin:         Assessment & Plan:

## 2018-02-07 NOTE — Assessment & Plan Note (Addendum)
She is somewhat improved but her persistence on CT is concerning.  Will start her on augmentin- I explained potential side effects from this (loose Bm, rash). Asked her to call if these develop.  Will plan for her to come back in 2 weeks Sheran Luz consider repeat imaging at that time.  Explained that if repeat imaging shows worsening she may need: 1) repeat IV/PIC, 2) repeat IR drain, 3) surgical eval.

## 2018-02-09 LAB — TOTAL BILIRUBIN, BODY FLUID: TOTBILIFLUID: 4 mg/dL

## 2018-02-11 LAB — CULTURE, BODY FLUID-BOTTLE: CULTURE: NO GROWTH

## 2018-02-11 LAB — CULTURE, BODY FLUID W GRAM STAIN -BOTTLE

## 2018-02-13 ENCOUNTER — Ambulatory Visit
Admission: RE | Admit: 2018-02-13 | Discharge: 2018-02-13 | Disposition: A | Discharge status: Home / Self Care | Source: Ambulatory Visit | Attending: Internal Medicine | Admitting: Internal Medicine

## 2018-02-13 ENCOUNTER — Other Ambulatory Visit: Payer: Self-pay | Admitting: Internal Medicine

## 2018-02-13 DIAGNOSIS — J90 Pleural effusion, not elsewhere classified: Secondary | ICD-10-CM

## 2018-02-19 ENCOUNTER — Ambulatory Visit (INDEPENDENT_AMBULATORY_CARE_PROVIDER_SITE_OTHER): Admitting: Infectious Diseases

## 2018-02-19 ENCOUNTER — Encounter: Payer: Self-pay | Admitting: Infectious Diseases

## 2018-02-19 DIAGNOSIS — K750 Abscess of liver: Secondary | ICD-10-CM

## 2018-02-19 NOTE — Assessment & Plan Note (Signed)
She appears to be doing well Will repeat her CTabd.  Will call her with result.  Plan for her to stay on augmentin for now.  Length of therapy to be determined by her CT scan.  rtc in 1 month.  D/i pt and she agrees with plan.

## 2018-02-19 NOTE — Progress Notes (Signed)
   Subjective:    Patient ID: Lisa Bauer, female    DOB: [birth year], 62 y.o.   MRN: [id number]  HPI 62 yo F adm on 8-7 with n/v and fever. She was found to have a liver abscess, IR drain placed that grew Strep group F. Her BCx grew viridans strep. She was treated with vanco/zosyn --> zosyn --> ceftriaxone.  She completed 9-1. She was d/c home on 8-22.  She returned to hospital on 8-24 with fever. She returned home that day.  She had f/u with IR on 9-3- her repeat Ct showed drain in place in ill-defined hypoattenuated hepatic lesion. Was told that her scan "didn't look great".  She was seen in ID 9-5 and was started on augmentin.  Feels better today. More energy, better ADLs. Still tired and losing wt (~ 14#).  Feels like she is "aware of her liver".  +Diarrhea. Is taking augmentin with food.   Review of Systems  Constitutional: Positive for unexpected weight change. Negative for appetite change, chills and fever.  Gastrointestinal: Positive for diarrhea. Negative for abdominal pain.  Genitourinary: Negative for difficulty urinating.  Skin: Negative for rash.  Please see HPI. All other systems reviewed and negative.      Objective:   Physical Exam  Constitutional: She is oriented to person, place, and time. She appears well-developed and well-nourished.  HENT:  Mouth/Throat: No oropharyngeal exudate.  Eyes: Pupils are equal, round, and reactive to light. EOM are normal.  Neck: Normal range of motion. Neck supple.  Cardiovascular: Normal rate, regular rhythm and normal heart sounds.  Pulmonary/Chest: Effort normal and breath sounds normal.  Abdominal: Soft. Bowel sounds are normal. She exhibits no distension. There is no tenderness.  Musculoskeletal: She exhibits no edema.  Lymphadenopathy:    She has no cervical adenopathy.  Neurological: She is alert and oriented to person, place, and time.  Psychiatric: She has a normal mood and affect.          Assessment & Plan:

## 2018-02-19 NOTE — Addendum Note (Signed)
Addended by: Ajdin Macke C on: 02/19/2018 11:57 AM   Modules accepted: Orders

## 2018-02-25 ENCOUNTER — Ambulatory Visit
Admission: RE | Admit: 2018-02-25 | Discharge: 2018-02-25 | Disposition: A | Discharge status: Home / Self Care | Source: Ambulatory Visit | Attending: Infectious Diseases | Admitting: Infectious Diseases

## 2018-02-25 DIAGNOSIS — K750 Abscess of liver: Secondary | ICD-10-CM

## 2018-02-25 MED ORDER — IOHEXOL 300 MG/ML  SOLN
100.0000 mL | Freq: Once | INTRAMUSCULAR | Status: AC | PRN
  Administered 2018-02-25: 100 mL via INTRAVENOUS

## 2018-02-26 ENCOUNTER — Telehealth: Payer: Self-pay | Admitting: Infectious Diseases

## 2018-02-26 NOTE — Telephone Encounter (Signed)
Called pt to discuss her CT scan. It is much better.   1. Persistent but improved complex fluid collections within the liver status post removal of percutaneous drainage catheter. The largest is in segment 8 with a volume of approximately 72 cc. 2. Similar size of large subcapsular fluid collection overlying the lateral right lobe of liver.  I asked her to continue on the augmentin and I will see her back in clinic in 3 weeks.  I will ask our nurses to f/u with her and make an appt.   She voices concern over a previous CXR which showed an enlarging pleural effusion. The last CXR in [place] is from 9-11. She will f/u with her PCP where the most recent CXR was done.

## 2018-02-26 NOTE — Telephone Encounter (Signed)
Called patient, rescheduled her appointment with Dr. Johnnye Sima for 3 weeks. Patient verbalized understanding. Pricilla Riffle RN

## 2018-03-04 ENCOUNTER — Telehealth: Payer: Self-pay

## 2018-03-04 NOTE — Telephone Encounter (Signed)
Patient called today asking if she should be seen before her appointment that is currently scheduled for 10/16. Patient states is currently feeling some discomfort in her ribs when she is breathing, sneezes, or is asleep. Patient would like a call from Dr. Johnnye Sima on what she should do. [place]

## 2018-03-06 NOTE — Telephone Encounter (Signed)
She can see me on the 16th or see her PCP sooner thanks

## 2018-03-06 NOTE — Telephone Encounter (Signed)
Called patient regarding message from 9/30. Relayed message from Dr. Johnnye Sima to contact her PCP if she needed to be seen before her scheduled appointment on 10/16. Patient verbalized understanding and will follow-up as scheduled.  [place]

## 2018-03-20 ENCOUNTER — Ambulatory Visit (INDEPENDENT_AMBULATORY_CARE_PROVIDER_SITE_OTHER): Admitting: Infectious Diseases

## 2018-03-20 ENCOUNTER — Encounter: Payer: Self-pay | Admitting: Infectious Diseases

## 2018-03-20 DIAGNOSIS — K750 Abscess of liver: Secondary | ICD-10-CM | POA: Diagnosis not present

## 2018-03-20 NOTE — Progress Notes (Signed)
   Subjective:    Patient ID: Lisa Bauer, female    DOB: 1955/08/21, 62 y.o.   MRN: [id number]  HPI 62 yo F adm on 8-7 with n/v and fever. She was found to have a liver abscess, IR drain placed that grew Strep group F. Her BCx grew viridans strep. She was treated with vanco/zosyn -->zosyn -->ceftriaxone.  She completed 9-1. She was d/c home on 8-22.  She returned to hospital on 8-24 with fever. She returned home that day.  She had f/u with IR on 9-3- her repeat Ct showed drain in place in ill-defined hypoattenuated hepatic lesion. Was told that her scan "didn't look great".  She was seen in ID 9-5 and was started on augmentin.  SHe had f/u on 9-17 and was continued on augmentin, had repeat CT scan 9-23-  1. Persistent but improved complex fluid collections within the liver status post removal of percutaneous drainage catheter. The largest is in segment 8 with a volume of approximately 72 cc. 2. Similar size of large subcapsular fluid collection overlying the lateral right lobe of liver. She has been doing well with the augmentin.  Feels well today.  Has gained some wt back.  Is sched to have repeat colon soon.   Day 69  Review of Systems  Constitutional: Negative for appetite change, chills, fever and unexpected weight change.  Respiratory: Negative for shortness of breath.   Gastrointestinal: Negative for abdominal pain, constipation and diarrhea.  Genitourinary: Negative for difficulty urinating.  pleuritic CP and with cough, sneeze.      Objective:   Physical Exam  Constitutional: She is oriented to person, place, and time. She appears well-developed and well-nourished.  HENT:  Mouth/Throat: No oropharyngeal exudate.  Eyes: Pupils are equal, round, and reactive to light. EOM are normal.  Neck: Normal range of motion. Neck supple.  Cardiovascular: Normal rate, regular rhythm and normal heart sounds.  Pulmonary/Chest: Effort normal and breath sounds normal.  Abdominal:  Soft. Bowel sounds are normal. She exhibits no distension. There is no tenderness.  Musculoskeletal: She exhibits no edema.  Lymphadenopathy:    She has no cervical adenopathy.  Neurological: She is alert and oriented to person, place, and time.       Assessment & Plan:

## 2018-03-20 NOTE — Assessment & Plan Note (Signed)
She appears to be doing well Will repeat her CT scan Will continue her augmentin while we await result.  Will call her with result as to whether she needs more anbx after that.  She will get colon soon via pcp.

## 2018-03-25 ENCOUNTER — Ambulatory Visit
Admission: RE | Admit: 2018-03-25 | Discharge: 2018-03-25 | Disposition: A | Discharge status: Home / Self Care | Source: Ambulatory Visit | Attending: Infectious Diseases | Admitting: Infectious Diseases

## 2018-03-25 DIAGNOSIS — K750 Abscess of liver: Secondary | ICD-10-CM

## 2018-03-25 MED ORDER — IOPAMIDOL (ISOVUE-300) INJECTION 61%
75.0000 mL | Freq: Once | INTRAVENOUS | Status: AC | PRN
  Administered 2018-03-25: 75 mL via INTRAVENOUS

## 2018-03-26 ENCOUNTER — Telehealth: Payer: Self-pay | Admitting: Infectious Diseases

## 2018-03-26 NOTE — Telephone Encounter (Signed)
Called pt to let her know about her repeat CT.  1. Persistent but improved multifocal liver and subcapsular liver abscesses. The largest abscess within segment 8 of the liver has a volume of 38 cc. Previously 70 cc. 2. No new abscess. Will continue her on her anbx, see her back in 1 month

## 2018-03-27 ENCOUNTER — Ambulatory Visit: Admitting: Infectious Diseases

## 2018-04-03 ENCOUNTER — Other Ambulatory Visit: Payer: Self-pay | Admitting: Infectious Diseases

## 2018-04-03 DIAGNOSIS — K750 Abscess of liver: Secondary | ICD-10-CM

## 2018-04-26 ENCOUNTER — Ambulatory Visit (INDEPENDENT_AMBULATORY_CARE_PROVIDER_SITE_OTHER): Admitting: Infectious Diseases

## 2018-04-26 ENCOUNTER — Encounter: Payer: Self-pay | Admitting: Infectious Diseases

## 2018-04-26 DIAGNOSIS — K750 Abscess of liver: Secondary | ICD-10-CM

## 2018-04-26 NOTE — Progress Notes (Signed)
   Subjective:    Patient ID: Lisa Bauer, female    DOB: 1956-02-03, 62 y.o.   MRN: [id number]  HPI 62 yo F adm on 8-7 with n/v and fever. She was found to have a liver abscess, IR drain placed that grew Strep group F. Her BCx grew viridans strep. She was treated with vanco/zosyn -->zosyn -->ceftriaxone.  She completed 9-1. She was d/c home on 8-22.  She returned to hospital on 8-24 with fever. She returned home that day.  She had f/u with IR on 9-3- her repeat Ct showed drain in place in ill-defined hypoattenuated hepatic lesion. Was told that her scan "didn't look great". She was seen in ID 9-5 and was started on augmentin.  SHe had f/u on 9-17 and was continued on augmentin, had repeat CT scan 9-23-  1. Persistent but improved complex fluid collections within the liver status post removal of percutaneous drainage catheter. The largest is in segment 8 with a volume of approximately 72 cc. 2. Similar size of large subcapsular fluid collection overlying the lateral right lobe of liver.  She had repeat CT 03-25-18 showing: 1. Persistent but improved multifocal liver and subcapsular liver abscesses. The largest abscess within segment 8 of the liver has a volume of 38 cc. Previously 70 cc. 2. No new abscess. She has been doing well with the augmentin, no problems. Worried that her hair is falling out from rx.    Day 106  Scheduled for colon 05-2018.   Review of Systems  Constitutional: Negative for appetite change, chills, fever and unexpected weight change.  Gastrointestinal: Negative for abdominal pain, constipation and diarrhea.  Genitourinary: Negative for difficulty urinating.  Psychiatric/Behavioral: Negative for sleep disturbance.  weight feels good.  Please see HPI. All other systems reviewed and negative.      Objective:   Physical Exam  Constitutional: She is oriented to person, place, and time. She appears well-developed and well-nourished.  HENT:  Mouth/Throat:  No oropharyngeal exudate.  Eyes: Pupils are equal, round, and reactive to light. EOM are normal.  Neck: Normal range of motion. Neck supple.  Cardiovascular: Normal rate, regular rhythm and normal heart sounds.  Pulmonary/Chest: Effort normal and breath sounds normal.  Abdominal: Soft. Bowel sounds are normal. She exhibits no distension. There is no tenderness.  Musculoskeletal: She exhibits no edema.  Lymphadenopathy:    She has no cervical adenopathy.  Neurological: She is alert and oriented to person, place, and time.          Assessment & Plan:

## 2018-04-26 NOTE — Assessment & Plan Note (Signed)
She appears to be doing well, she has had no problems with anbx  Spoke with IR about next study (she has gotten 4 CTs at this point) Will call her with results and determine need for further anbx based on studies.

## 2018-05-01 ENCOUNTER — Ambulatory Visit
Admission: RE | Admit: 2018-05-01 | Discharge: 2018-05-01 | Disposition: A | Discharge status: Home / Self Care | Source: Ambulatory Visit | Attending: Infectious Diseases | Admitting: Infectious Diseases

## 2018-05-01 ENCOUNTER — Telehealth: Payer: Self-pay | Admitting: Infectious Diseases

## 2018-05-01 ENCOUNTER — Telehealth: Payer: Self-pay | Admitting: Infectious Disease

## 2018-05-01 DIAGNOSIS — K769 Liver disease, unspecified: Secondary | ICD-10-CM

## 2018-05-01 DIAGNOSIS — K750 Abscess of liver: Secondary | ICD-10-CM

## 2018-05-01 NOTE — Telephone Encounter (Signed)
Sound showed enlargement of 1 of the liver abscesses at least I want to make sure she is still on antibiotics and have told her via voicemail that she needs to call back to the hospital operator talk to me and she needs at minimum a CT scan arrange as an outpatient but certainly she is not feeling well should come to the ER for evaluation and treatment.

## 2018-05-06 ENCOUNTER — Telehealth: Payer: Self-pay | Admitting: *Deleted

## 2018-05-06 NOTE — Telephone Encounter (Signed)
thanks

## 2018-05-06 NOTE — Telephone Encounter (Signed)
Called Ms Vest re: her recent u/s result.  Would like to repeat her CT scan and for her to continue to take po anbx.  I called an left VM on her phone.

## 2018-05-06 NOTE — Telephone Encounter (Signed)
Patient returning missed call to [facility] and advises she is still taking her antibiotic. Advised she is now available by phone if he needs to call her back.

## 2018-05-07 ENCOUNTER — Other Ambulatory Visit: Payer: Self-pay | Admitting: Infectious Diseases

## 2018-05-07 DIAGNOSIS — K769 Liver disease, unspecified: Secondary | ICD-10-CM

## 2018-05-07 NOTE — Progress Notes (Unsigned)
CT abd and p

## 2018-05-08 ENCOUNTER — Other Ambulatory Visit: Payer: Self-pay | Admitting: Infectious Diseases

## 2018-05-08 DIAGNOSIS — K750 Abscess of liver: Secondary | ICD-10-CM

## 2018-05-13 ENCOUNTER — Ambulatory Visit
Admission: RE | Admit: 2018-05-13 | Discharge: 2018-05-13 | Disposition: A | Discharge status: Home / Self Care | Source: Ambulatory Visit | Attending: Infectious Diseases | Admitting: Infectious Diseases

## 2018-05-13 DIAGNOSIS — K769 Liver disease, unspecified: Secondary | ICD-10-CM

## 2018-05-13 MED ORDER — IOPAMIDOL (ISOVUE-300) INJECTION 61%
100.0000 mL | Freq: Once | INTRAVENOUS | Status: AC | PRN
  Administered 2018-05-13: 100 mL via INTRAVENOUS

## 2018-05-16 ENCOUNTER — Telehealth: Payer: Self-pay | Admitting: Infectious Diseases

## 2018-05-16 NOTE — Telephone Encounter (Signed)
Called Lisa Bauer and let her know about her CT scan.  Will keep her on oral anbx and see her back in beginning of February.

## 2018-06-10 ENCOUNTER — Other Ambulatory Visit: Payer: Self-pay | Admitting: Infectious Diseases

## 2018-06-10 DIAGNOSIS — K750 Abscess of liver: Secondary | ICD-10-CM

## 2018-07-12 ENCOUNTER — Other Ambulatory Visit: Payer: Self-pay | Admitting: Infectious Diseases

## 2018-07-12 DIAGNOSIS — K750 Abscess of liver: Secondary | ICD-10-CM

## 2018-07-19 ENCOUNTER — Ambulatory Visit (INDEPENDENT_AMBULATORY_CARE_PROVIDER_SITE_OTHER): Admitting: Infectious Diseases

## 2018-07-19 ENCOUNTER — Other Ambulatory Visit: Payer: Self-pay | Admitting: Infectious Diseases

## 2018-07-19 ENCOUNTER — Encounter: Payer: Self-pay | Admitting: Infectious Diseases

## 2018-07-19 DIAGNOSIS — K750 Abscess of liver: Secondary | ICD-10-CM

## 2018-07-19 DIAGNOSIS — K769 Liver disease, unspecified: Secondary | ICD-10-CM

## 2018-07-19 DIAGNOSIS — I10 Essential (primary) hypertension: Secondary | ICD-10-CM

## 2018-07-19 NOTE — Progress Notes (Signed)
   Subjective:    Patient ID: Lisa Bauer, female    DOB: 10/20/1955, 63 y.o.   MRN: [id number]  HPI 63 yo F adm on 8-7 with n/v and fever. She was found to have a liver abscess, IR drain placed that grew Strep group F. Her BCx grew viridans strep. She was treated with vanco/zosyn -->zosyn -->ceftriaxone.  She completed 9-1. She was d/c home on 8-22.  She returned to hospital on 8-24 with fever. She returned home that day.  She had f/u with IR on 9-3- her repeat Ct showed drain in place in ill-defined hypoattenuated hepatic lesion. Was told that her scan "didn't look great". She was seen in ID 9-5 and was started on augmentin. SHe had f/u on 9-17 and was continued on augmentin, had repeat CT scan 9-23- 1. Persistent but improved complex fluid collections within the liver status post removal of percutaneous drainage catheter. The largest is in segment 8 with a volume of approximately 72 cc. 2. Similar size of large subcapsular fluid collection overlying the lateral right lobe of liver.  She had repeat CT 03-25-18 showing: 1. Persistent but improved multifocal liver and subcapsular liver abscesses. The largest abscess within segment 8 of the liver has a volume of 38 cc. Previously 70 cc. 2. No new abscess.  She had repeat CT on 12-9: 1. Decrease in size of dominant central hepatic abscess (2.5 x 1.1 cm <--- 4.9 x 3.9) as well as the subcapsular RIGHT hepatic lobe abscess. (1.8 x 0.9 <-- 3.4 x 1.7 cm) 2. No evidence of new abscesses. 3. No biliary duct obstruction. 4. Leiomyomatous uterus  She was kept on po augmentin after alerted to this.   Day 191  Colonoscopy 05-2018 normal.   Review of Systems  Constitutional: Negative for chills and fever.  Respiratory: Negative for shortness of breath.   Cardiovascular: Negative for chest pain.  Gastrointestinal: Negative for constipation and diarrhea.  Genitourinary: Negative for difficulty urinating.  Neurological: Negative for  headaches.  Please see HPI. All other systems reviewed and negative.      Objective:   Physical Exam Constitutional:      Appearance: Normal appearance. She is normal weight.  Abdominal:     General: Abdomen is flat. Bowel sounds are normal. There is no distension.     Palpations: Abdomen is soft.     Tenderness: There is no abdominal tenderness.  Neurological:     Mental Status: She is alert.           Assessment & Plan:

## 2018-07-19 NOTE — Assessment & Plan Note (Signed)
She did take her rx this am. Attributes to running late for appt.  She will f/u with PCP

## 2018-07-19 NOTE — Assessment & Plan Note (Signed)
She would like to repeat her CT scan before we consider stopping her anbx.  We discussed the long term side effects of repeated CT scan (0.1% increase risk in [place]). She agrees to proceed.  Will call her after repeat CT scan and we will make plans thereafter.

## 2018-07-24 ENCOUNTER — Encounter: Payer: Self-pay | Admitting: Radiology

## 2018-07-24 ENCOUNTER — Ambulatory Visit
Admission: RE | Admit: 2018-07-24 | Discharge: 2018-07-24 | Disposition: A | Discharge status: Home / Self Care | Source: Ambulatory Visit | Attending: Infectious Diseases | Admitting: Infectious Diseases

## 2018-07-24 DIAGNOSIS — K769 Liver disease, unspecified: Secondary | ICD-10-CM

## 2018-07-24 MED ORDER — IOPAMIDOL (ISOVUE-300) INJECTION 61%
100.0000 mL | Freq: Once | INTRAVENOUS | Status: AC | PRN
  Administered 2018-07-24: 100 mL via INTRAVENOUS

## 2018-07-26 ENCOUNTER — Telehealth: Payer: Self-pay | Admitting: Infectious Diseases

## 2018-07-26 NOTE — Telephone Encounter (Signed)
Called pt to let her know results of CT scan.  Will continue augmentin, will see her back in 3 months.

## 2018-08-08 ENCOUNTER — Other Ambulatory Visit: Payer: Self-pay

## 2018-08-08 DIAGNOSIS — K769 Liver disease, unspecified: Secondary | ICD-10-CM

## 2018-08-09 NOTE — Addendum Note (Signed)
Addended by: Eugenia Mcalpine on: 08/09/2018 09:18 AM   Modules accepted: Orders

## 2018-08-12 ENCOUNTER — Telehealth: Payer: Self-pay

## 2018-08-12 ENCOUNTER — Other Ambulatory Visit: Payer: Self-pay | Admitting: Infectious Diseases

## 2018-08-12 DIAGNOSIS — K750 Abscess of liver: Secondary | ICD-10-CM

## 2018-08-12 NOTE — Telephone Encounter (Signed)
Received refill request for amoxicillin 125 mg. Will route message to MD to advise if it's okay to refill. [place]

## 2018-08-13 ENCOUNTER — Other Ambulatory Visit: Payer: Self-pay | Admitting: *Deleted

## 2018-08-13 DIAGNOSIS — K750 Abscess of liver: Secondary | ICD-10-CM

## 2018-08-13 MED ORDER — AMOXICILLIN-POT CLAVULANATE 875-125 MG PO TABS: 1.0000 | ORAL_TABLET | Freq: Two times a day (BID) | ORAL | 3 refills | Status: AC

## 2018-08-13 NOTE — Telephone Encounter (Signed)
Per note from 07/26/18 ok to refill until next visit.

## 2018-09-23 ENCOUNTER — Ambulatory Visit
Admission: RE | Admit: 2018-09-23 | Discharge: 2018-09-23 | Disposition: A | Discharge status: Home / Self Care | Source: Ambulatory Visit | Attending: Infectious Diseases | Admitting: Infectious Diseases

## 2018-09-23 ENCOUNTER — Other Ambulatory Visit: Payer: Self-pay

## 2018-09-23 DIAGNOSIS — K769 Liver disease, unspecified: Secondary | ICD-10-CM

## 2018-09-23 MED ORDER — IOPAMIDOL (ISOVUE-300) INJECTION 61%
100.0000 mL | Freq: Once | INTRAVENOUS | Status: AC | PRN
  Administered 2018-09-23: 10:00:00 100 mL via INTRAVENOUS

## 2018-09-24 ENCOUNTER — Telehealth: Payer: Self-pay | Admitting: Infectious Diseases

## 2018-09-24 NOTE — Telephone Encounter (Signed)
Called pt to let her know about her CT that showed resolution.  Will stop anbx.  Pt grateful.

## 2018-12-18 ENCOUNTER — Telehealth: Payer: Self-pay

## 2018-12-18 NOTE — Telephone Encounter (Signed)
She has been doing well.  Encouraged her to wash her hands, wear a mask, social distance while traveling.  Family has been quaranting.  I cleared her to travel to [place].

## 2018-12-18 NOTE — Telephone Encounter (Signed)
Patient wanted to know if she is considered high risk for assisting her son with childcare of an infant, out of state.  She would be traveling with [employer] who has social distancing [place].   She would be traveling in September.   She wants to know given her past history of hepatic abscess should she travel to [place] and if she should provide care to an infant.    Please advise.   [phone] pt.   Laverle Patter, RN

## 2019-01-13 ENCOUNTER — Other Ambulatory Visit: Payer: Self-pay

## 2019-01-13 DIAGNOSIS — Z20822 Contact with and (suspected) exposure to covid-19: Secondary | ICD-10-CM

## 2019-01-14 LAB — NOVEL CORONAVIRUS, NAA: SARS-CoV-2, NAA: NOT DETECTED

## 2019-01-22 ENCOUNTER — Other Ambulatory Visit: Payer: Self-pay

## 2019-01-22 DIAGNOSIS — Z20822 Contact with and (suspected) exposure to covid-19: Secondary | ICD-10-CM

## 2019-01-24 LAB — NOVEL CORONAVIRUS, NAA: SARS-CoV-2, NAA: NOT DETECTED

## 2019-04-19 IMAGING — US US GUIDANCE NEEDLE PLACEMENT
1 series · 13 of 19 positions shown · non-contrast
Comparison: none

INDICATION: Heterogeneous mass versus abscess of the liver with liquefied
component. Presentation with abdominal pain and sepsis with positive
blood cultures.

[Series 1: us guidance needle placement · 0.26mm/px · 13 of 19 slices shown]
[im 1/19]
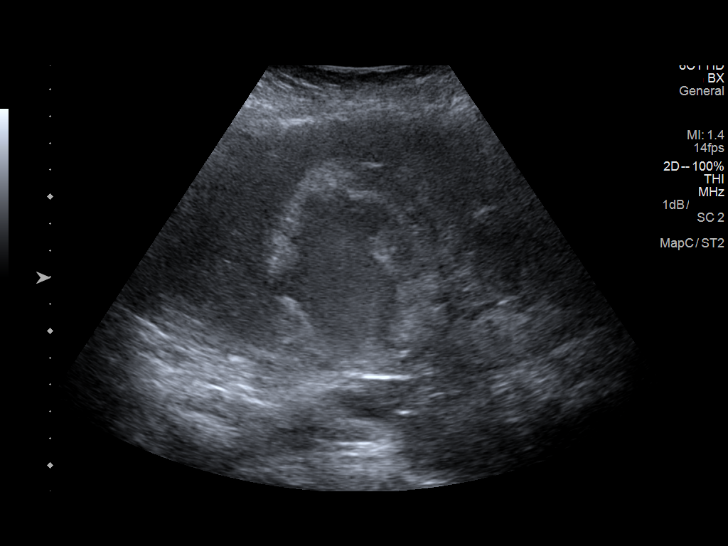
[im 3/19]
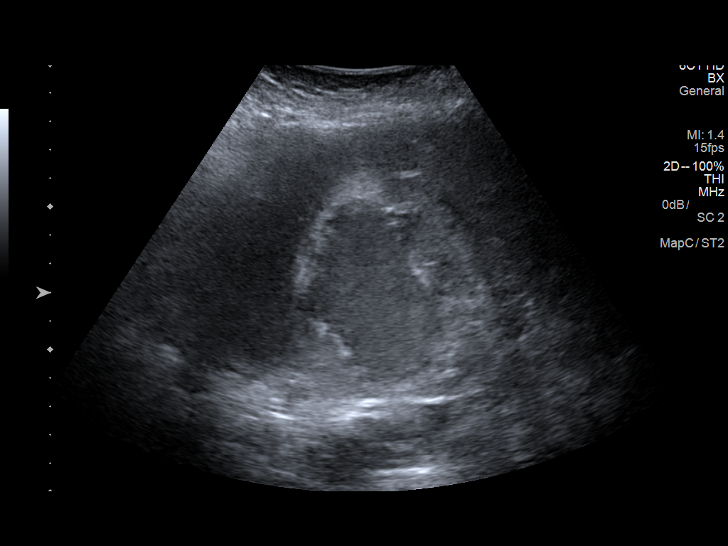
[im 4/19]
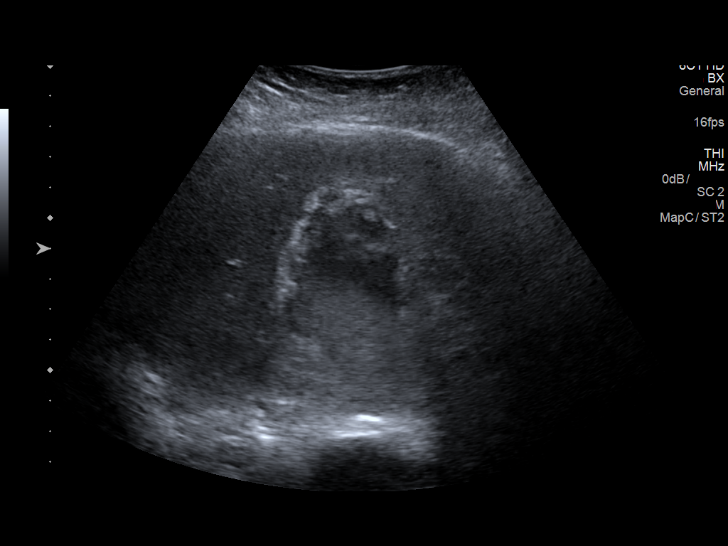
[im 6/19]
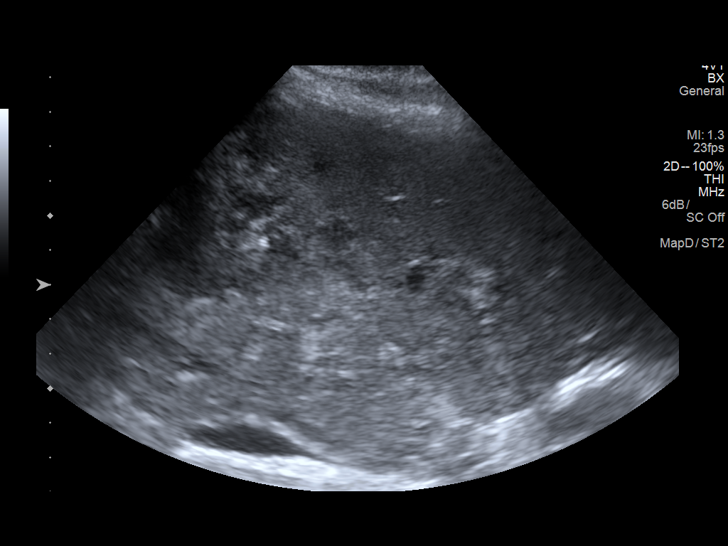
[im 7/19]
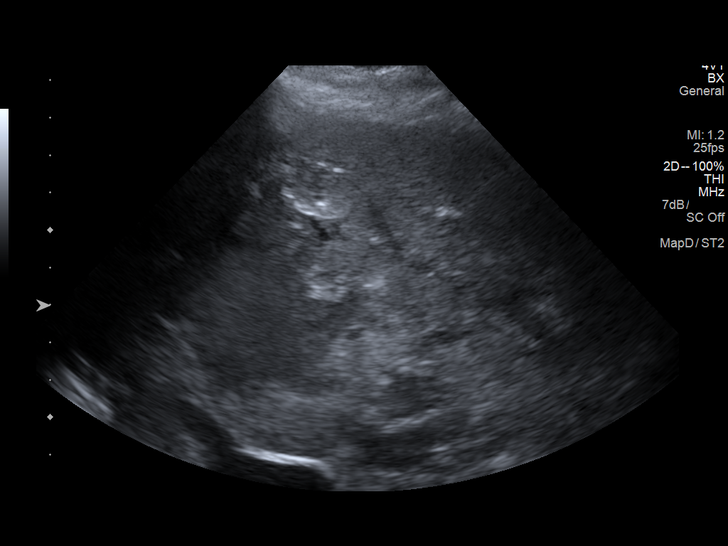
[im 9/19]
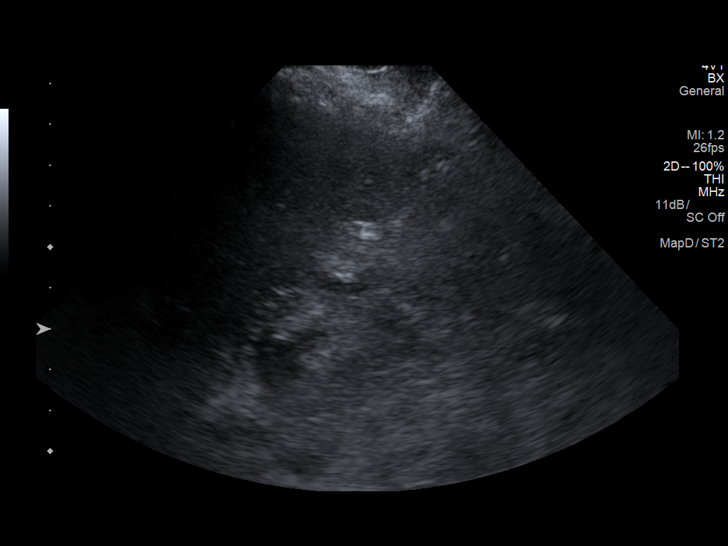
[im 10/19]
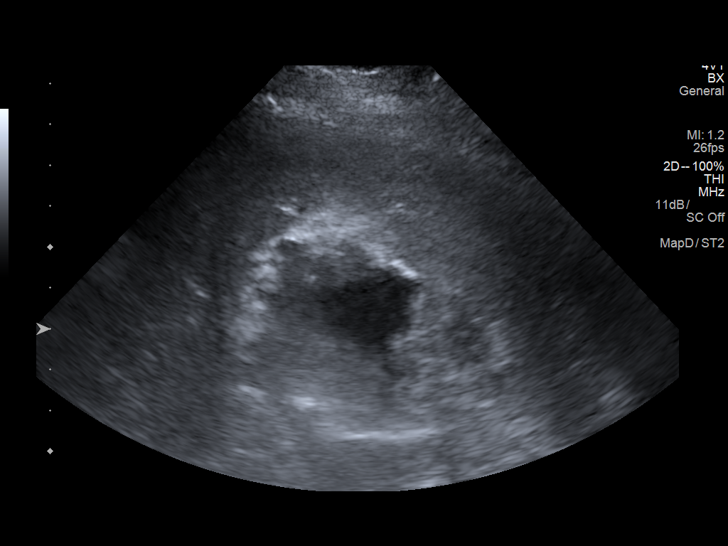
[im 11/19]
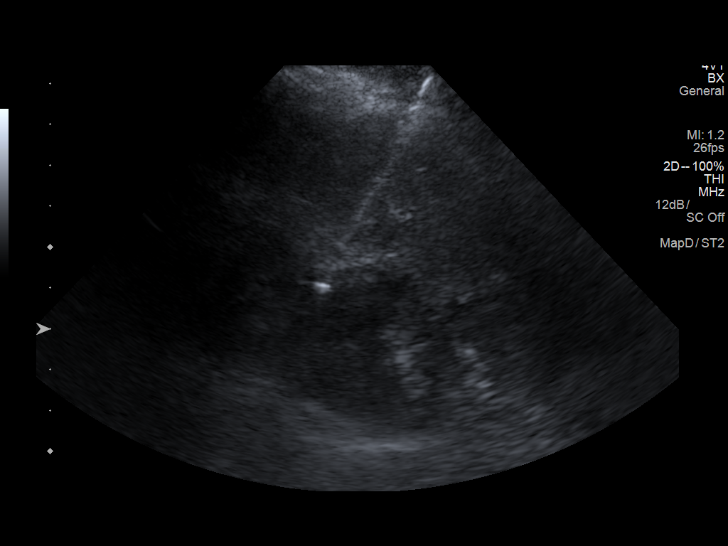
[im 13/19]
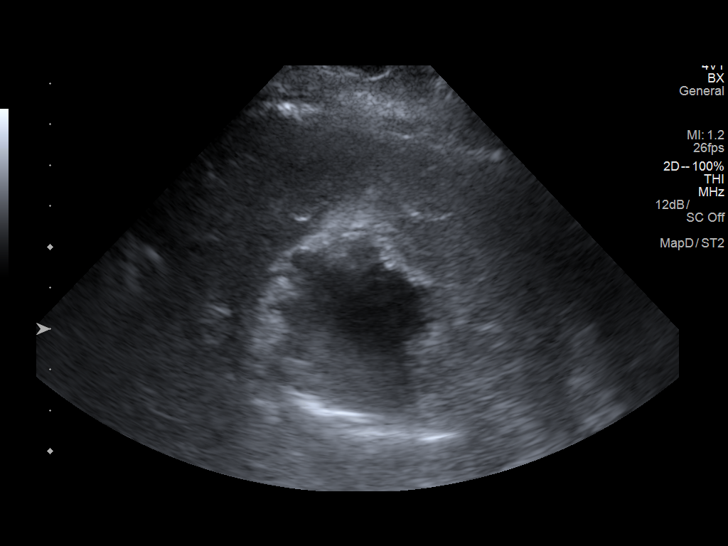
[im 14/19]
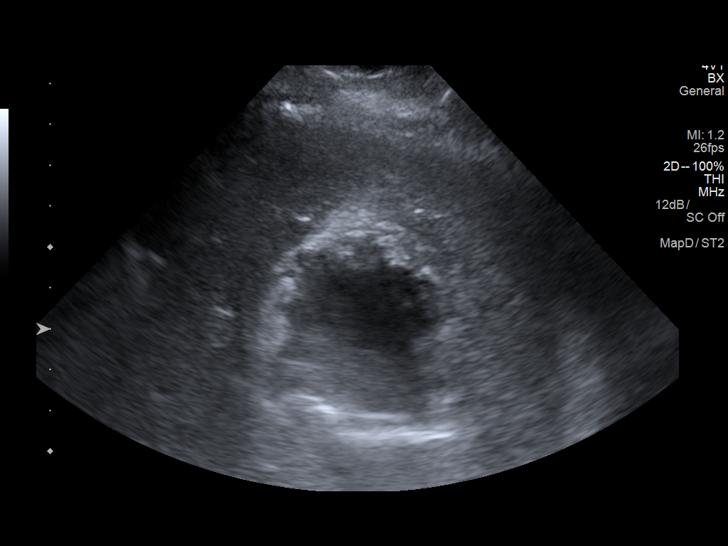
[im 16/19]
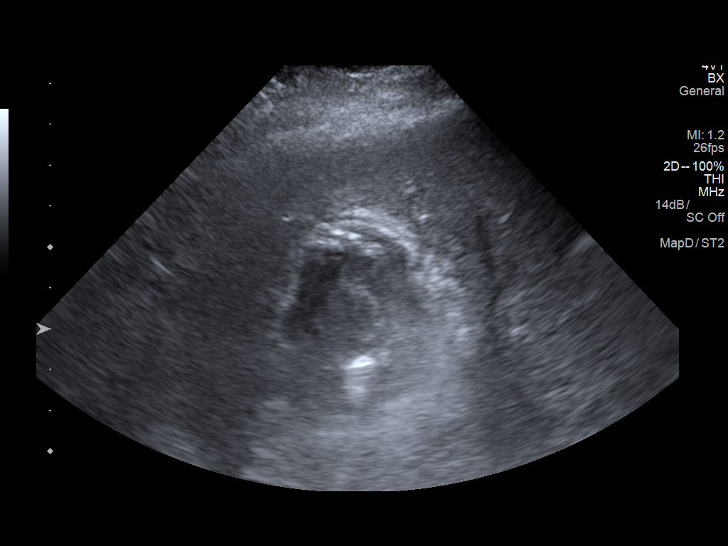
[im 17/19]
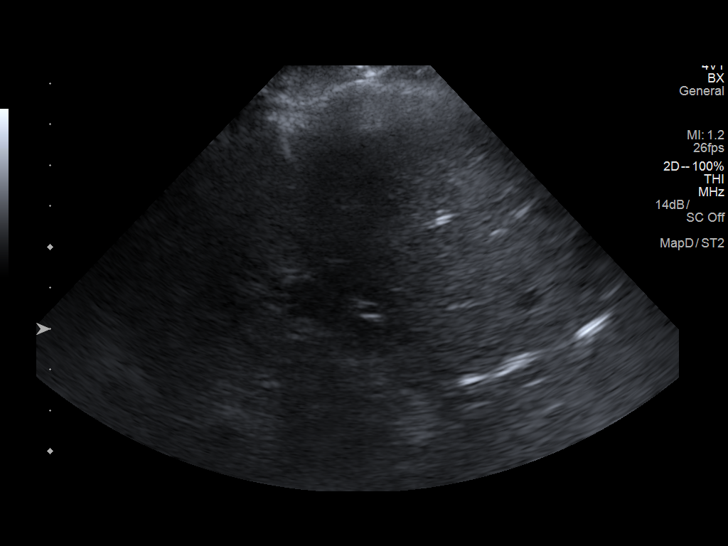
[im 19/19]
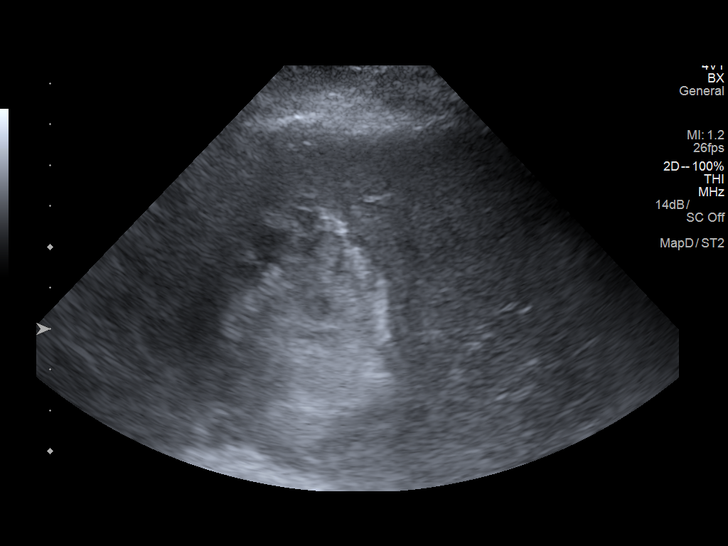

[13 of 19 positions shown; findings below may reference images not displayed]

EXAM:
1. ULTRASOUND-GUIDED CORE BIOPSY LIVER
2. ULTRASOUND GUIDED PERCUTANEOUS CATHETER DRAINAGE OF LIVER

MEDICATIONS:
The patient is currently admitted to the hospital and receiving
intravenous antibiotics. The antibiotics were administered within an
appropriate time frame prior to the initiation of the procedure.

ANESTHESIA/SEDATION:
Fentanyl 75 mcg IV; Versed 1.5 mg IV

Moderate Sedation Time:  31 minutes.

The patient was continuously monitored during the procedure by the
interventional radiology nurse under my direct supervision.

COMPLICATIONS:
None immediate.

PROCEDURE:
Informed written consent was obtained from the patient after a
thorough discussion of the procedural risks, benefits and
alternatives. All questions were addressed. Maximal Sterile Barrier
Technique was utilized including caps, mask, sterile gowns, sterile
gloves, sterile drape, hand hygiene and skin antiseptic. A timeout
was performed prior to the initiation of the procedure.

Ultrasound was performed of the liver. Under ultrasound guidance, a
17 gauge needle was advanced into the right lobe. 18 gauge core
biopsy samples were obtained with 3 samples submitted in formalin.
The 17 gauge needle was then advanced into a liquified component.
Aspiration of fluid was performed and sample sent for both culture
analysis and cytology.

A guidewire was advanced through the needle. The tract was dilated
and a 10 French percutaneous drainage catheter advanced. Catheter
position was confirmed by ultrasound. The catheter was connected to
a suction bulb. The drainage catheter was secured at the skin with a
Prolene retention suture and adhesive StatLock device.
FINDINGS: Large heterogeneous abnormality of the liver is present with
ill-defined borders. The area of parenchymal abnormality spans at
least an area 11 cm. The echogenic rim surrounding a liquified
component was sampled with tissue biopsy. Aspiration at the level of
the liquefied component yielded bloody and turbid fluid. A 10 French
drain was placed in this collection which is draining bloody fluid.
IMPRESSION: Ultrasound-guided biopsy of the liver lesion along the edge of the
liquefied component followed by aspiration of the liquefied
component yielding bloody and turbid fluid which was sent for
culture analysis and cytology. Additional placement of a 10 French
percutaneous drainage catheter into the liquefied component which
was attached to suction bulb drainage.

## 2020-06-25 IMAGING — DX DG CHEST 2V
2 series · 2 of 2 positions shown · non-contrast
Comparison: 09/23/2017

CLINICAL DATA: Fever, weakness and dizziness over the last 3 days.

EXAM:
CHEST - 2 VIEW

[x chest ap]
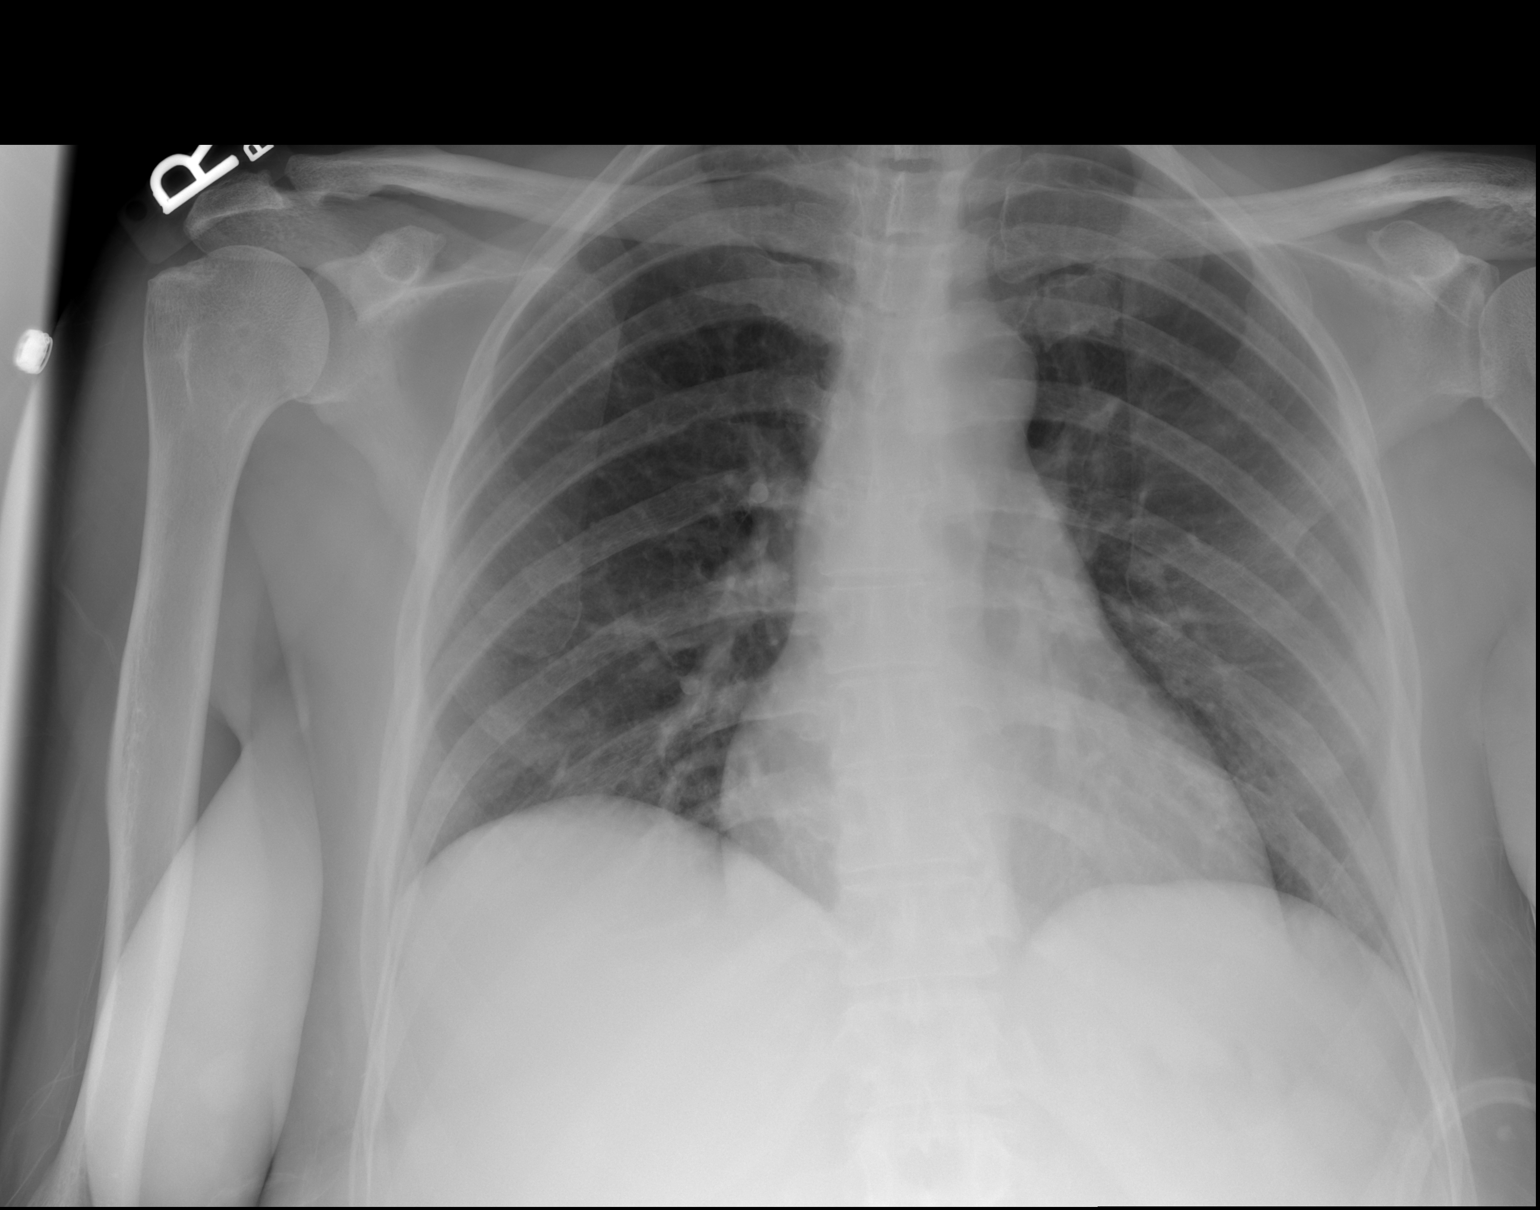

[w chest lat]
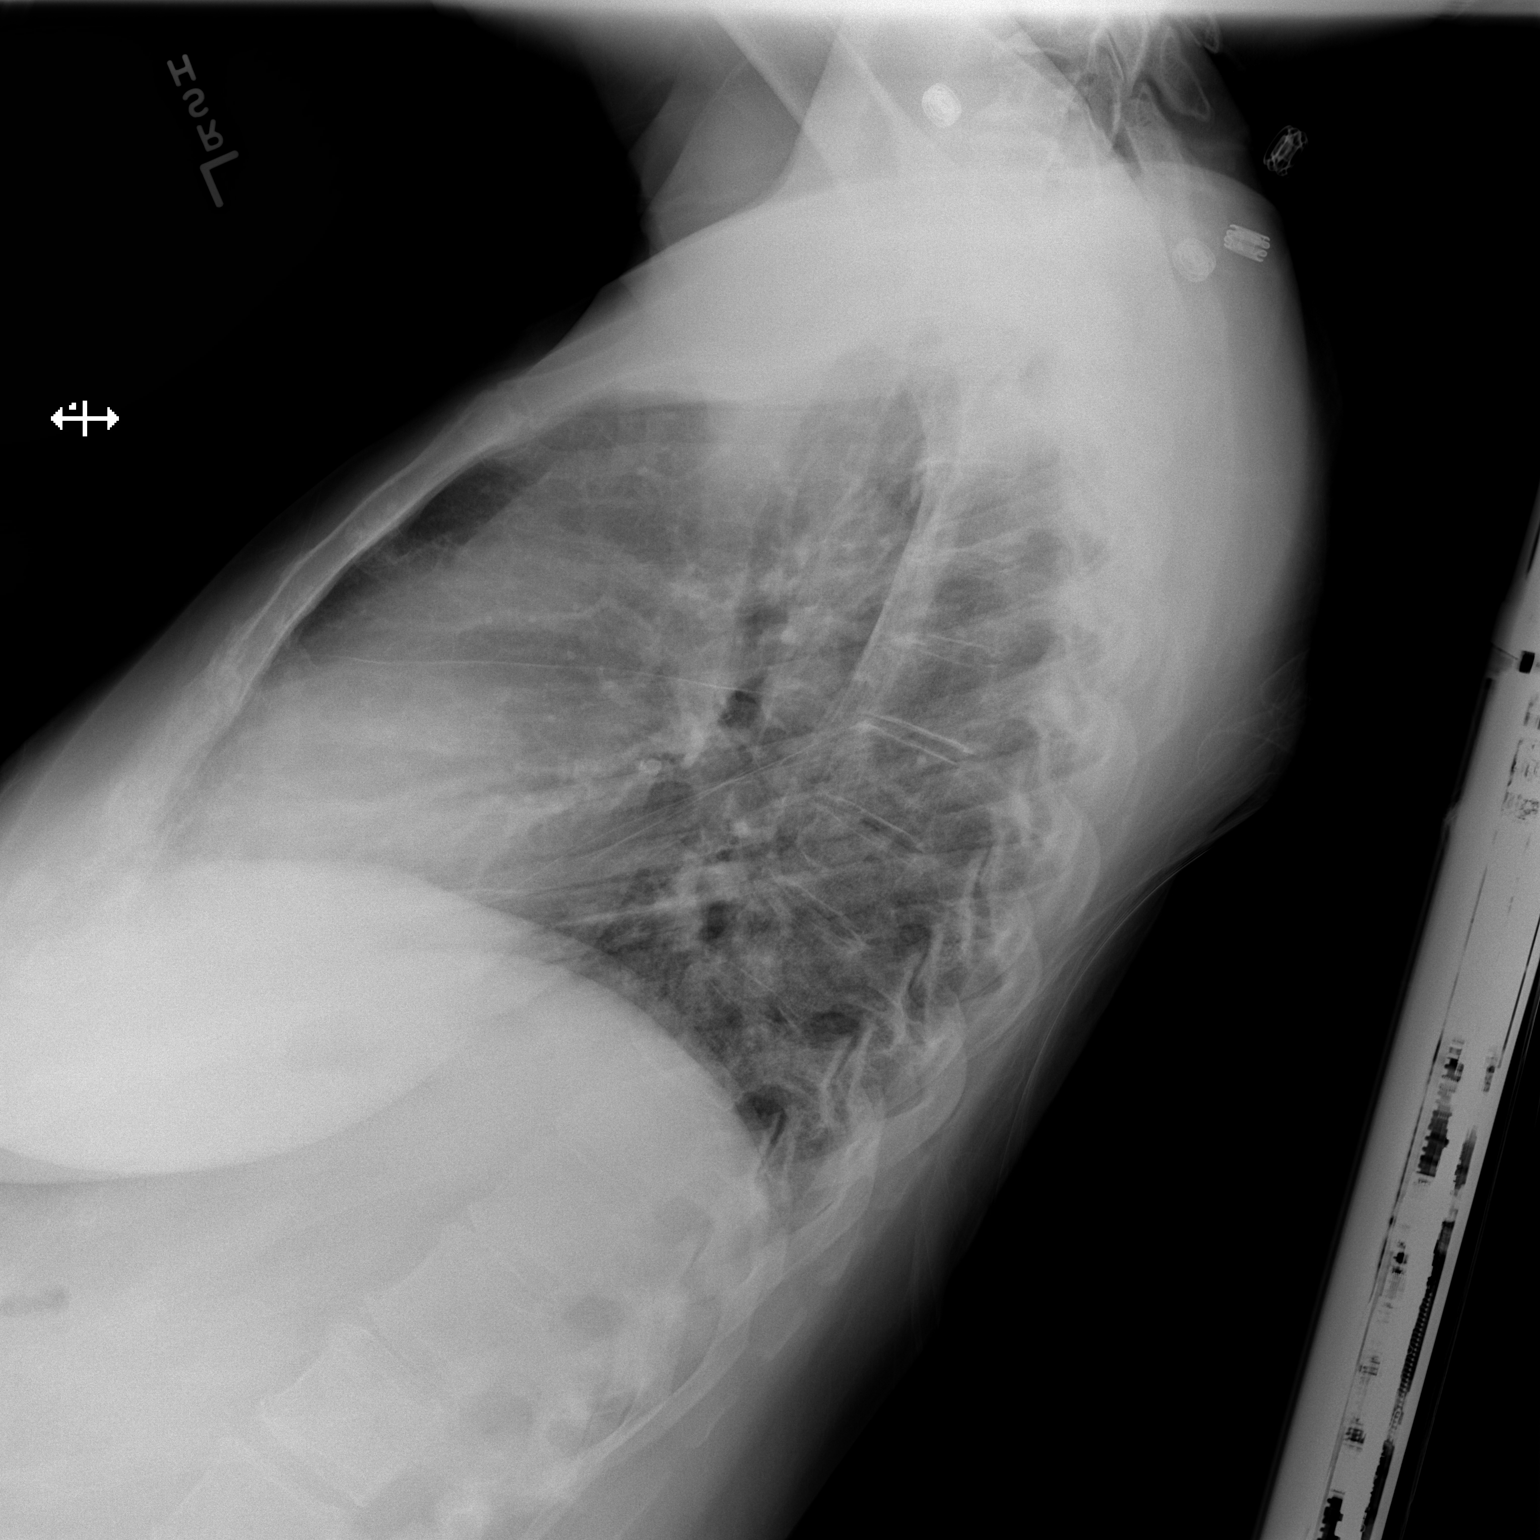

[2 of 2 positions shown; findings below may reference images not displayed]

FINDINGS: Heart and mediastinal shadows are normal. Lung volumes are
diminished which could be due to a poor inspiration for underlying
lung disease. Lung markings appear more prominent diffusely, which
again could relate to the poor inspiration or could indicate mild
diffuse pneumonitis. No dense consolidation or lobar collapse. No
effusions. No significant bone finding.
IMPRESSION: Poor inspiration versus widespread pneumonitis. No dense
consolidation or collapse.

## 2020-06-27 IMAGING — DX DG CHEST 1V PORT
1 series · 1 of 1 positions shown · non-contrast
Comparison: Radiograph January 09, 2018.

CLINICAL DATA: Dyspnea.

EXAM:
PORTABLE CHEST 1 VIEW

[chest ap]
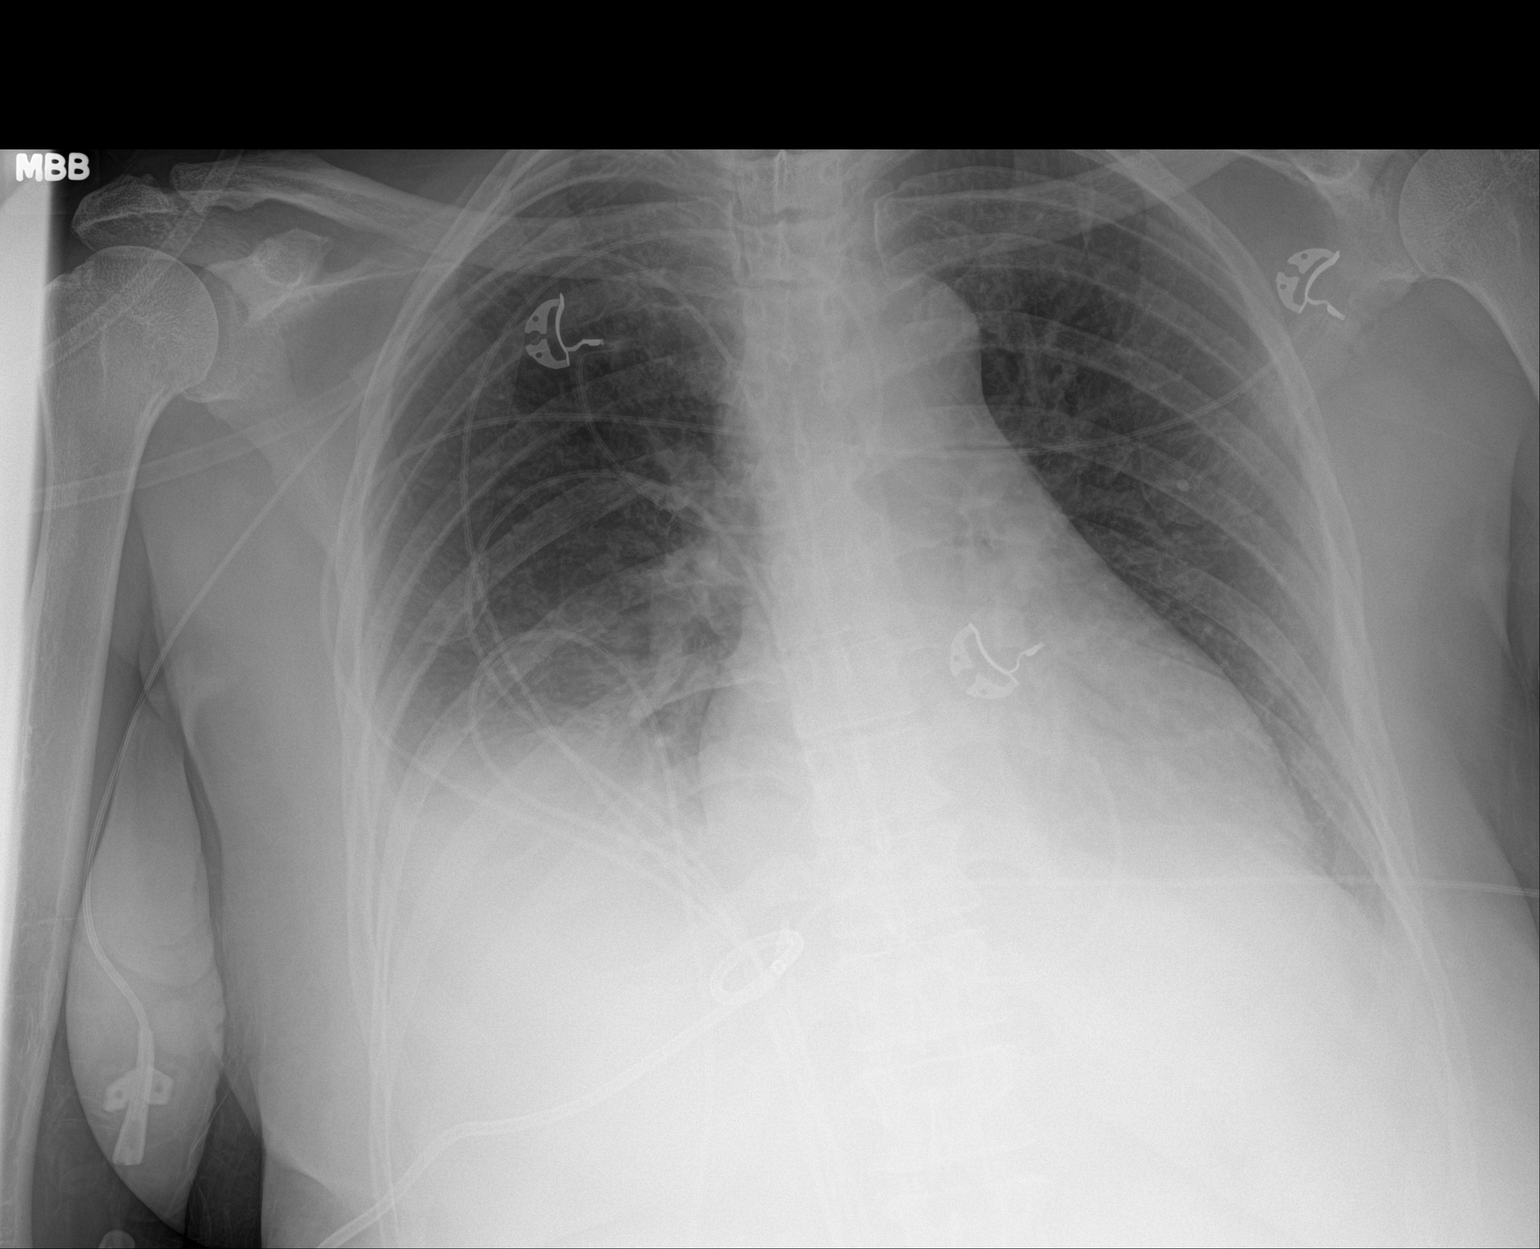

[1 of 1 positions shown; findings below may reference images not displayed]

FINDINGS: Stable cardiomediastinal silhouette. No pneumothorax is noted. Left
lung is clear. Right-sided PICC line is noted with distal tip in
expected position of SVC. Mild right basilar opacity is noted
concerning for atelectasis or infiltrate with associated pleural
effusion. Bony thorax is unremarkable.
IMPRESSION: Interval development of mild right basilar opacity concerning for
atelectasis or infiltrate with possible associated pleural effusion.
Interval placement of right-sided PICC line with distal tip in
expected position of SVC.

## 2020-07-23 IMAGING — US IR THORACENTESIS ASP PLEURAL SPACE W/IMG GUIDE
1 series · 2 of 2 positions shown · non-contrast
Comparison: none

INDICATION: History of hepatic abscess status post drain placement on January 11, 2018. Now with large right pleural effusion. Request for diagnostic
and therapeutic thoracentesis.

[Series 1: ir (id) (id)/(id)/(id) ir · 2 of 2 slices shown]
[im 1/2]
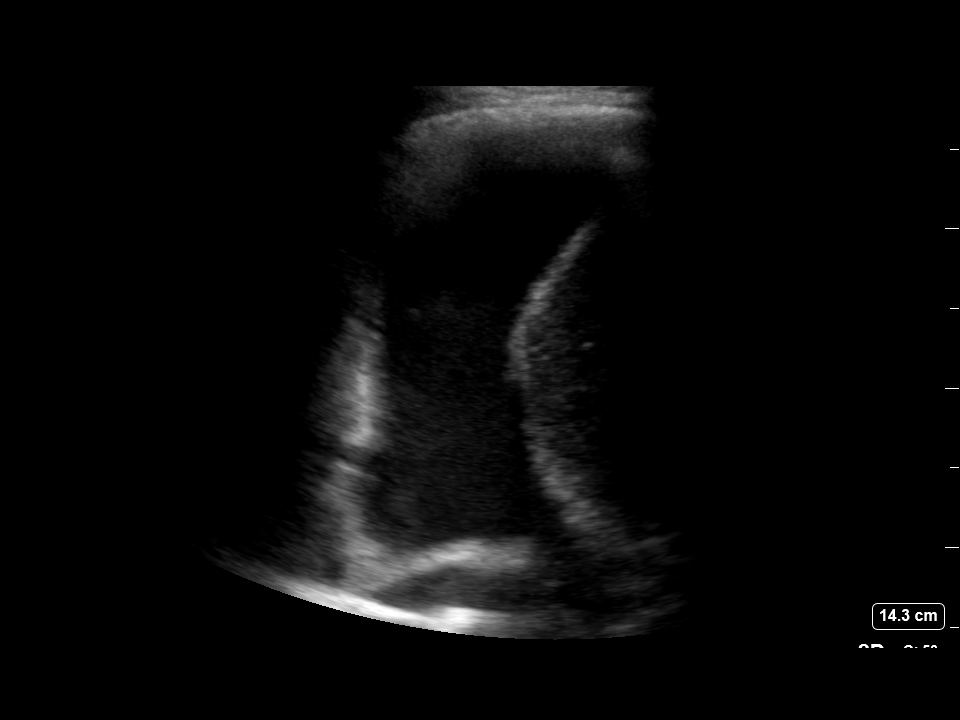
[im 2/2]
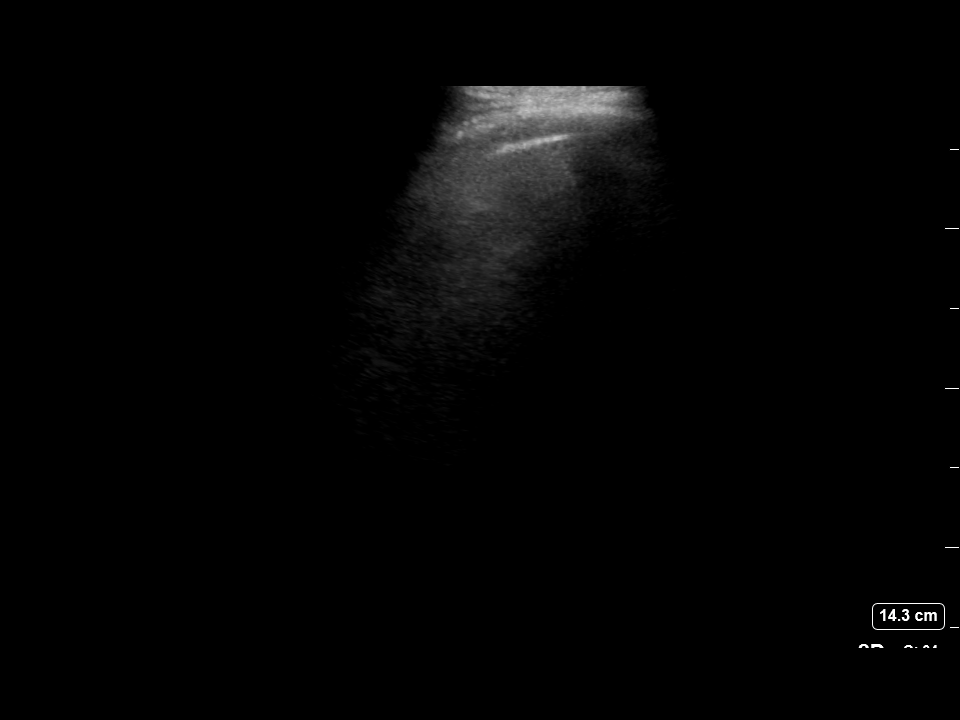

[2 of 2 positions shown; findings below may reference images not displayed]

EXAM:
ULTRASOUND GUIDED RIGHT THORACENTESIS

MEDICATIONS:
2% lidocaine 10 mL

COMPLICATIONS:
None immediate.

PROCEDURE:
An ultrasound guided thoracentesis was thoroughly discussed with the
patient and questions answered. The benefits, risks, alternatives
and complications were also discussed. The patient understands and
wishes to proceed with the procedure. Written consent was obtained.

Ultrasound was performed to localize and mark an adequate pocket of
fluid in the right chest. The area was then prepped and draped in
the normal sterile fashion. 1% Lidocaine was used for local
anesthesia. Under ultrasound guidance a 6 Fr Safe-T-Centesis
catheter was introduced. Thoracentesis was performed. The catheter
was removed and a dressing applied.
FINDINGS: A total of approximately 1.7 L of dark brown fluid was removed.
Samples were sent to the laboratory as requested by the clinical
team.
IMPRESSION: Successful ultrasound guided right thoracentesis yielding 1.7 L of
pleural fluid.

No pneumothorax on post-procedure chest x-ray.

## 2020-07-23 IMAGING — DX DG CHEST 1V
1 series · 1 of 1 positions shown · non-contrast
Comparison: PA and lateral chest 01/25/2018.

CLINICAL DATA: Status post right thoracentesis today.

EXAM:
CHEST  1 VIEW

[w chest pa]
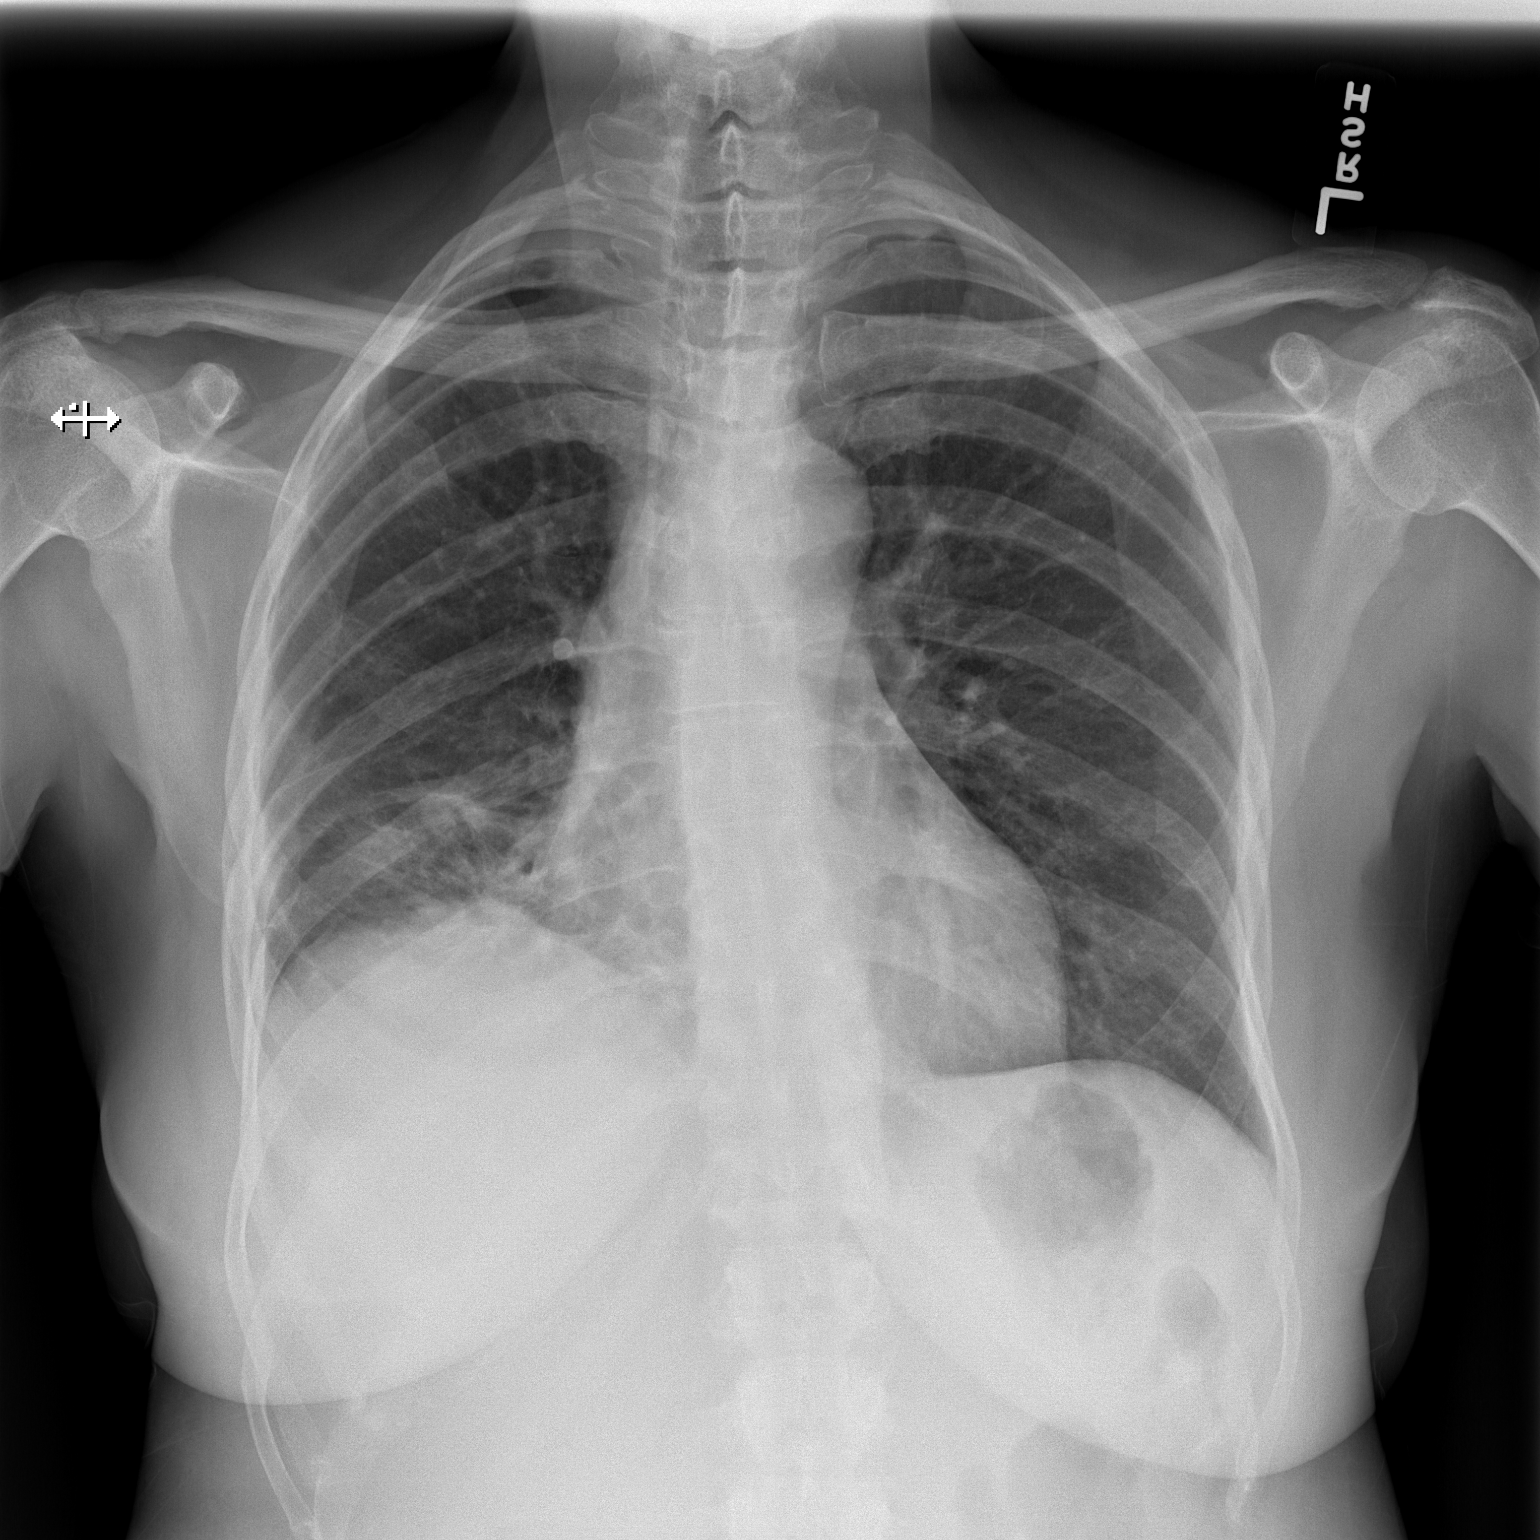

[1 of 1 positions shown; findings below may reference images not displayed]

FINDINGS: Right pleural effusion is markedly decreased after thoracentesis. No
pneumothorax. Mild right basilar atelectasis noted. Left lung is
clear. No left effusion. Heart size is normal. Pigtail catheter in
the right chest present on the prior examination has been removed.
IMPRESSION: Negative for pneumothorax after right thoracentesis.

Mild right basilar atelectasis.

## 2020-07-27 ENCOUNTER — Ambulatory Visit (INDEPENDENT_AMBULATORY_CARE_PROVIDER_SITE_OTHER): Payer: Self-pay | Admitting: Sports Medicine

## 2020-07-27 ENCOUNTER — Other Ambulatory Visit: Payer: Self-pay

## 2020-07-27 VITALS — BP 122/82 | Ht 69.0 in | Wt 178.0 lb

## 2020-07-27 DIAGNOSIS — M7542 Impingement syndrome of left shoulder: Secondary | ICD-10-CM

## 2020-07-28 ENCOUNTER — Encounter: Payer: Self-pay | Admitting: Sports Medicine

## 2020-07-28 NOTE — Progress Notes (Signed)
   Subjective:    Patient ID: Lisa Bauer, female    DOB: [birth year], 65 y.o.   MRN: [id number]  HPI chief complaint: Left shoulder pain  Very pleasant right-hand-dominant 65 year old female comes in today complaining of 1 month of left shoulder pain.  She denies any injury but rather describes a gradual onset of pain that she localizes to the anterior shoulder.  It is present only with overhead exercise, specifically overhead weight lifting.  She denies pain with any other activity.  No pain with activities of daily living.  No nighttime pain.  No weakness.  Pain does not radiate.  No numbness or tingling.  No prior shoulder issues in the past.  Main reason for today's visit is to make sure that she does not have a significant rotator cuff injury.  Past medical history reviewed Medications reviewed Allergies reviewed    Review of Systems    As above Objective:   Physical Exam  Well-developed, well-nourished.  No acute distress  Left shoulder: Patient has full range of motion but a positive painful arc.  She is tender to palpation primarily in the anterior shoulder around the pectoralis minor but has no pain with resisted pectoralis flexion.  No soft tissue swelling.  No tenderness to palpation over the acromioclavicular joint.  No tenderness over the bicipital groove.  Her rotator cuff strength is 5/5 and does not reproduce pain.  Negative empty can, mildly positive Hawkins.  Negative speeds, negative Yergason's.  Neurovascularly intact distally.      Assessment & Plan:   Intermittent left shoulder pain likely secondary to rotator cuff impingement  Although her location of pain is a little atypical for rotator cuff impingement, her positive painful arc and pain with overhead weightlifting suggest this as her diagnosis.  We are going to treat her with a home exercise program consisting of Jobe exercises and scapular stabilization exercises.  She is reassured that I do not suspect any  significant rotator cuff injury, but I recommend that she avoid repetitive overhead weightlifting for the overall health of her shoulders.  She can certainly substitute other exercises that will not stress her shoulder joint.  If symptoms persist then I would consider x-rays, [place], and formal physical therapy (likely with [facility] since she is a client of [facility]).  She will follow-up for ongoing or recalcitrant issues.

## 2021-06-21 ENCOUNTER — Ambulatory Visit: Payer: Self-pay

## 2021-06-23 ENCOUNTER — Encounter: Payer: Self-pay | Admitting: Physical Therapy

## 2021-06-23 ENCOUNTER — Ambulatory Visit: Payer: Medicare Other | Attending: Internal Medicine | Admitting: Physical Therapy

## 2021-06-23 ENCOUNTER — Other Ambulatory Visit: Payer: Self-pay

## 2021-06-23 DIAGNOSIS — M25561 Pain in right knee: Secondary | ICD-10-CM | POA: Insufficient documentation

## 2021-06-23 DIAGNOSIS — M6281 Muscle weakness (generalized): Secondary | ICD-10-CM | POA: Diagnosis present

## 2021-06-23 NOTE — Therapy (Addendum)
OUTPATIENT PHYSICAL THERAPY LOWER EXTREMITY EVALUATION   Patient Name: Lisa Bauer MRN: [id number] DOB:[birth year], 66 y.o., female Today's Date: 06/23/2021   PT End of Session - 06/23/21 0901     Visit Number 1    Number of Visits 12    Date for PT Re-Evaluation 08/04/21    Authorization Type [employer] Primary MCR  6th foto/ 10th progress note    Progress Note Due on Visit 10    PT Start Time 0845    PT Stop Time 0931    PT Time Calculation (min) 46 min    Activity Tolerance Patient tolerated treatment well    Behavior During Therapy [facility] for tasks assessed/performed             Past Medical History:  Diagnosis Date   Hypertension    Liver abscess 01/2018   bulb drain in place   Past Surgical History:  Procedure Laterality Date   IR RADIOLOGIST EVAL & MGMT  02/05/2018   IR THORACENTESIS ASP PLEURAL SPACE W/IMG GUIDE  02/06/2018   Patient Active Problem List   Diagnosis Date Noted   Hypertensive disorder 02/05/2018   Sciatica 02/05/2018   Uterine leiomyoma 02/05/2018   ARF (acute renal failure) ([place]) 01/11/2018   Severe sepsis with septic shock ([place]) 01/11/2018   Bacteremia due to Streptococcus 01/11/2018   Elevated liver function tests 01/11/2018   Anemia 01/11/2018   Hepatic abscess 01/09/2018   Bursitis of hip 08/09/2017    PCP: Wenda Low, MD  REFERRING PROVIDER: Wenda Low, MD  REFERRING DIAG: M25.569 (ICD-10-CM) - Knee pain  THERAPY DIAG:  Acute pain of right knee  Muscle weakness (generalized)  ONSET DATE: about 2 weeks ago ? First of January 2023  SUBJECTIVE:   SUBJECTIVE STATEMENT: About 2 weeks ago, I was in an exercise class with Leatrice Jewels PT at [facility] and she does a bone strength class.  I have had bil OA.  I did not hear a pop, The more I exercised the knees felt hard to bend and painful.  I had trouble sleeping because of the pain in the R knee.   I have an appt with Dr Alvan Dame to check but I wanted to get in to PT. I  stopped walking because I was worried that it was damaging my knee.   I can stand unlimited I can walk for 5 miles  PERTINENT HISTORY:  OA,Hepatic Abscess 2019 with ARF and sepsis, anemia , bursitis of hip, Sciatica  PAIN:  Are you having pain? Yes NPRS scale: 0/10 at rest   going up stairs 4/10,  10/10 that day in exercise class  Pain location: R knee Pain orientation: Anterior and Upper  pole of patella PAIN TYPE: sharp and stabbing at injury   now aching Pain description: intermittent  Aggravating factors: going up stairs, wt bearing in and out of chairs Relieving factors: Ice  elevation. Iburprofen  PRECAUTIONS: None  WEIGHT BEARING RESTRICTIONS No  FALLS:  Has patient fallen in last 6 months? Yes, Number of falls: 0  LIVING ENVIRONMENT: Lives with: lives with their family and lives with their spouse Lives in: House/apartment Stairs: Yes; Internal: 12 steps; can reach both and External: 7 steps; can reach both Has following equipment at home: None  OCCUPATION: Retired.  Homemaker   PLOF: Independent  PATIENT GOALS Be able to go up and down steps without pain.  Get stronger out of my chair  just get stronger all over   OBJECTIVE:  DIAGNOSTIC FINDINGS: None for knee  PATIENT SURVEYS:  FOTO eval 72% predicted 79%  COGNITION:  Overall cognitive status: Within functional limits for tasks assessed     SENSATION:  Light touch: Appears intact  Stereognosis: Appears intact  Hot/Cold: Appears intact  Proprioception: Appears intact  MUSCLE LENGTH: Hamstrings: WNL Thomas test: WNL  POSTURE:  Slender  slight forward head  PALPATION: TTP -slight tenderness over superior pole of patella but generally no pain  LE AROM/PROM:  A/PROM Right 06/23/2021 Left 06/23/2021  Hip flexion 110 112  Hip extension    Hip abduction    Hip adduction    Hip internal rotation    Hip external rotation    Knee flexion 140/P142 140 P144  Knee extension 0 0  Ankle dorsiflexion     Ankle plantarflexion    Ankle inversion    Ankle eversion     (Blank rows = not tested)  LE MMT:  MMT Right 06/23/2021 Left 06/23/2021  Hip flexion 4+/5 4+/5  Hip extension 4+/5 4+/5  Hip abduction 4-/5 4-/5  Hip adduction    Hip internal rotation    Hip external rotation    Knee flexion 4+/5 4+/5  Knee extension 4+/5 4+/5  Ankle dorsiflexion    Ankle plantarflexion 5/5 5/5  Ankle inversion    Ankle eversion     (Blank rows = not tested)  LOWER EXTREMITY SPECIAL TESTS:  Knee special tests: Anterior drawer test: negative, Thessaly test: negative, and Step up/down test: positive  Pain on R and knee valgus with step down.  FUNCTIONAL TESTS:  5 times sit to stand: 16.76 sec Squat- pt pitches forward and has reduced Hip hinge and inability  to squat at knee flexion 90 degrees ( 60 degrees)  GAIT: Distance walked: 150 Assistive device utilized: None Level of assistance: Complete Independence Comments: Normal gait    TODAY'S TREATMENT: Pt given initial HEP   PATIENT EDUCATION:  Education details: POC  Explanation of findings  FOTO report, intial HEP Person educated: Patient Education method: Explanation, Demonstration, Tactile cues, and Verbal cues Education comprehension: verbalized understanding, returned demonstration, and needs further education   HOME EXERCISE PROGRAM: Access Code: [email or web address] URL: [email or web address] Date: 06/23/2021 Prepared by: Voncille Lo   Exercises Squat with Chair Touch - 1 x daily - 7 x weekly - 3 sets - 10 reps Clam with Resistance - 1 x daily - 7 x weekly - 3 sets - 10 reps Supine Hip Flexion with Resistance Loop - 1 x daily - 7 x weekly - 3 sets - 10 reps  ASSESSMENT:  CLINICAL IMPRESSION: Patient is a 66 y.o. female who was seen today for physical therapy evaluation and treatment for low complexity eval for R knee pain. Ms Vasko has hx of OA and has lead an active lifestyle but 2 weeks ago injured R knee  in strengthening class with subsequent difficulty bending knee and sleeping.  At injury 10/10, at rest 0/10, going up stairs 4/10  anterior knee pain has mostly resolved but pt does exhibit valgus R knee going down steps indicative of weak hips R> L  Pt would like return to exercise class with greater reserve and strength . Objective impairments include decreased activity tolerance, decreased mobility, decreased ROM, decreased strength, improper body mechanics, and pain. These impairments are limiting patient from cleaning, community activity, meal prep, church, and return to Pil[employer] and exercise . Personal factors including 1 comorbidity: OA  are also affecting patient's functional outcome. Patient will benefit from skilled  PT to address above impairments and improve overall function.  REHAB POTENTIAL: Excellent  CLINICAL DECISION MAKING: Stable/uncomplicated  EVALUATION COMPLEXITY: Low   GOALS: Goals reviewed with patient? Yes  SHORT TERM GOALS:  STG Name Target Date Goal status  1 Pt will be independent with HEP Baseline: limited knowledge 07/14/2021 INITIAL  2 Pt will be able to show reduced risk of fall with 5 x STS 13.0 sec or less Baseline:  eval 16.76 sec 07/14/2021 INITIAL  3 Pain will decrease to 1/10 with all functional activities Baseline: pain to 4/10 with exercise and daily activities 07/14/2021 INITIAL  4 Pt will return to walking 3 times a week for at least 3 miles Baseline: Pt has stopped walking due to fear of injuring knee 07/14/2021 INITIALA   LONG TERM GOALS:   LTG Name Target Date Goal status  1 Pt will be independent with advanced HEP Baseline:no knowledge of progressive overload 08/04/2021 INITIAL  2 Pt will be able to demonstrate Goblet squats with 90/90 hip flex and knee while utilizing 25 # or more with symmetrical form Baseline: unable to squat more than 60 degrees knee flex without pitching trunk forward 08/04/2021 INITIAL  3 Pt will be able to deadlift  55 lb to show  increased LE strength Baseline: 0 deadlift 08/04/2021 INITIAL  4 Pt will be able to negotiate stairs without exacerbating pain. Baseline: eval 4/10 pain 08/04/2021 INITIAL  5 FOTO will improve from 72%   to  79 %  indicating improved functional mobility Baseline:Eval 72% 08/04/2021 INITIAL  6 Pt will demonstrate floor to stand transfer to show evidence of reduced fall risk and to be able to participate in yoga and pilates pain free Baseline: Pt is hesitant to bend knee due to pain in flexion  08/04/2021 INITIAL   PLAN: PT FREQUENCY: 2x/week  PT DURATION: 6 weeks  PLANNED INTERVENTIONS: Therapeutic exercises, Therapeutic activity, Neuro Muscular re-education, Balance training, Gait training, Patient/Family education, Joint mobilization, Stair training, Dry Needling, Electrical stimulation, Cryotherapy, Moist heat, Taping, Ionotophoresis 4mg /ml Dexamethasone, and Manual therapy  PLAN FOR NEXT SESSION: Taping as necessary for pain if pt has pain.  Review HEP  and add hip strengthening, TKE,  work on deadlift and proper squat   Voncille Lo, PT, [place] Certified Exercise Expert for the Aging Adult  06/23/21 8:45 PM Phone: [phone] Fax: [phone]

## 2021-06-23 NOTE — Patient Instructions (Signed)
Access Code: [email or web address] URL: [email or web address] Date: 06/23/2021 Prepared by: Voncille Lo  Exercises Squat with Chair Touch - 1 x daily - 7 x weekly - 3 sets - 10 reps Clam with Resistance - 1 x daily - 7 x weekly - 3 sets - 10 reps Supine Hip Flexion with Resistance Loop - 1 x daily - 7 x weekly - 3 sets - 10 reps   Voncille Lo, PT, [facility] Certified Exercise Expert for the Aging Adult  06/23/21 9:37 AM Phone: [phone] Fax: [phone]

## 2021-06-29 NOTE — Therapy (Signed)
OUTPATIENT PHYSICAL THERAPY TREATMENT NOTE   Patient Name: Lisa Bauer MRN: [id number] DOB:[birth year], 66 y.o., female Today's Date: 06/30/2021  PCP: Wenda Low, MD REFERRING PROVIDER: Wenda Low, MD   PT End of Session - 06/30/21 1320     Visit Number 2    Number of Visits 12    Date for PT Re-Evaluation 08/04/21    Authorization Type [employer] Primary MCR  6th foto/ 10th progress note    PT Start Time 1103    PT Stop Time 1145    PT Time Calculation (min) 42 min    Activity Tolerance Patient tolerated treatment well    Behavior During Therapy [facility] for tasks assessed/performed             Past Medical History:  Diagnosis Date   Hypertension    Liver abscess 01/2018   bulb drain in place   Past Surgical History:  Procedure Laterality Date   IR RADIOLOGIST EVAL & MGMT  02/05/2018   IR THORACENTESIS ASP PLEURAL SPACE W/IMG GUIDE  02/06/2018   Patient Active Problem List   Diagnosis Date Noted   Hypertensive disorder 02/05/2018   Sciatica 02/05/2018   Uterine leiomyoma 02/05/2018   ARF (acute renal failure) ([place]) 01/11/2018   Severe sepsis with septic shock ([place]) 01/11/2018   Bacteremia due to Streptococcus 01/11/2018   Elevated liver function tests 01/11/2018   Anemia 01/11/2018   Hepatic abscess 01/09/2018   Bursitis of hip 08/09/2017    REFERRING DIAG: M25.569 (ICD-10-CM) - Knee pain  THERAPY DIAG:  Acute pain of right knee  Muscle weakness (generalized)  PERTINENT HISTORY: OA,Hepatic Abscess 2019 with ARF and sepsis, anemia , bursitis of hip, Sciatica  PRECAUTIONS: None  SUBJECTIVE: I am doing my exercises.  I have pain with squatting.  I haven't had severe pain since the incident but I don't want to do any damage to my knees     PAIN:  Are you having pain? Yes NPRS scale: 0/10 at rest   going up stairs 4/10,  Squatting 4/10  Pain location: R knee Pain orientation: Anterior and Upper  pole of patella PAIN TYPE: sharp and stabbing at  injury   now aching Pain description: intermittent  Aggravating factors: going up stairs, wt bearing in and out of chairs Relieving factors: Ice  elevation. Iburprofen      OBJECTIVE:  All objective findings taken on eval unless otherwise noted   DIAGNOSTIC FINDINGS: None for knee   PATIENT SURVEYS:  FOTO eval 72% predicted 79%   COGNITION:          Overall cognitive status: Within functional limits for tasks assessed                        SENSATION:          Light touch: Appears intact          Stereognosis: Appears intact          Hot/Cold: Appears intact          Proprioception: Appears intact   MUSCLE LENGTH: Hamstrings: WNL Thomas test: WNL   POSTURE:  Slender  slight forward head   PALPATION: TTP -slight tenderness over superior pole of patella but generally no pain   LE AROM/PROM:   A/PROM Right 06/23/2021 Left 06/23/2021  Hip flexion 110 112  Hip extension      Hip abduction      Hip adduction      Hip internal  rotation      Hip external rotation      Knee flexion 140/P142 140 P144  Knee extension 0 0  Ankle dorsiflexion      Ankle plantarflexion      Ankle inversion      Ankle eversion       (Blank rows = not tested)   LE MMT:   MMT Right 06/23/2021 Left 06/23/2021  Hip flexion 4+/5 4+/5  Hip extension 4+/5 4+/5  Hip abduction 4-/5 4-/5  Hip adduction      Hip internal rotation      Hip external rotation      Knee flexion 4+/5 4+/5  Knee extension 4+/5 4+/5  Ankle dorsiflexion      Ankle plantarflexion 5/5 5/5  Ankle inversion      Ankle eversion       (Blank rows = not tested)   LOWER EXTREMITY SPECIAL TESTS:  Knee special tests: Anterior drawer test: negative, Thessaly test: negative, and Step up/down test: positive  Pain on R and knee valgus with step down.   FUNCTIONAL TESTS:  5 times sit to stand: 16.76 sec Squat- pt pitches forward and has reduced Hip hinge and inability  to squat at knee flexion 90 degrees ( 60 degrees)    GAIT: Distance walked: 150 Assistive device utilized: None Level of assistance: Complete Independence Comments: Normal gait       TODAY'S TREATMENT:  [facility] Adult PT Treatment:                                                DATE: 06-30-21 Therapeutic Exercise: Elevated seat with there ex cushion  box squat 1 x 10 2/10 pain in knees Box squat without there ex cushion and taping of knees 1 x 10 Box squat with 10# KB 3 x 10  Side Stepping with Resistance at Ankles 3 x10 reps Single Leg Heel Raise with Counter Support -1 sets - 30 reps Hip Extension with Resistance Loop  3 sets - 10 reps Hip Abduction with Resistance Loop -3 sets  Standing hip flexion with GTB Manual Therapy: McConnell taping med to lateral pull bil knees  pain decreased with squats      PATIENT EDUCATION:  Education details: Updated HEP for hip and knee/ McConnell tape Person educated: Patient Education method: Explanation, Demonstration, Tactile cues, and Verbal cues Education comprehension: verbalized understanding, returned demonstration, and needs further education     HOME EXERCISE PROGRAM: Access Code: [email or web address] URL: [email or web address] Date: 06/30/2021 Prepared by: Voncille Lo  Program Notes Goblet squat starts elevated with chair.   Exercises Goblet Squat with Kettlebell - 1 x daily - 7 x weekly - 3 sets - 10 reps Supine Hip Flexion with Resistance Loop - 1 x daily - 7 x weekly - 3 sets - 10 reps Side Stepping with Resistance at Ankles - 1 x daily - 7 x weekly - 3 sets - 10 reps Single Leg Heel Raise with Counter Support - 1 x daily - 7 x weekly - 1 sets - 30 reps Hip Extension with Resistance Loop - 1 x daily - 7 x weekly - 3 sets - 10 reps Hip Abduction with Resistance Loop - 1 x daily - 7 x weekly - 3 sets - 10 reps    ASSESSMENT:   CLINICAL IMPRESSION: Ms Altmann enters clinic with 4/10 pain  and continues to have pain with squatting maneuvers.  Utilizing bands does not  improve pain but using [employer] tape does improve with squat mechanics.  Pt was able to utilize [employer] taping for remainder of exercise given today.  Will continue POC   REHAB POTENTIAL: Excellent   CLINICAL DECISION MAKING: Stable/uncomplicated   EVALUATION COMPLEXITY: Low     GOALS: Goals reviewed with patient? Yes   SHORT TERM GOALS:   STG Name Target Date Goal status  1 Pt will be independent with HEP Baseline: limited knowledge 07/14/2021 INITIAL  2 Pt will be able to show reduced risk of fall with 5 x STS 13.0 sec or less Baseline:  eval 16.76 sec 07/14/2021 INITIAL  3 Pain will decrease to 1/10 with all functional activities Baseline: pain to 4/10 with exercise and daily activities 07/14/2021 INITIAL  4 Pt will return to walking 3 times a week for at least 3 miles Baseline: Pt has stopped walking due to fear of injuring knee 07/14/2021 INITIALA    LONG TERM GOALS:    LTG Name Target Date Goal status  1 Pt will be independent with advanced HEP Baseline:no knowledge of progressive overload 08/04/2021 INITIAL  2 Pt will be able to demonstrate Goblet squats with 90/90 hip flex and knee while utilizing 25 # or more with symmetrical form Baseline: unable to squat more than 60 degrees knee flex without pitching trunk forward 08/04/2021 INITIAL  3 Pt will be able to deadlift  55 lb to show increased LE strength Baseline: 0 deadlift 08/04/2021 INITIAL  4 Pt will be able to negotiate stairs without exacerbating pain. Baseline: eval 4/10 pain 08/04/2021 INITIAL  5 FOTO will improve from 72%   to  79 %  indicating improved functional mobility Baseline:Eval 72% 08/04/2021 INITIAL  6 Pt will demonstrate floor to stand transfer to show evidence of reduced fall risk and to be able to participate in yoga and pilates pain free Baseline: Pt is hesitant to bend knee due to pain in flexion   08/04/2021 INITIAL    PLAN: PT FREQUENCY: 2x/week   PT DURATION: 6 weeks   PLANNED INTERVENTIONS: Therapeutic  exercises, Therapeutic activity, Neuro Muscular re-education, Balance training, Gait training, Patient/Family education, Joint mobilization, Stair training, Dry Needling, Electrical stimulation, Cryotherapy, Moist heat, Taping, Ionotophoresis 4mg /ml Dexamethasone, and Manual therapy   PLAN FOR NEXT SESSION: check taping  teach taping if effective TKE,  work on deadlift and proper squat  Goals    Voncille Lo, PT, [place] Certified Exercise Expert for the Aging Adult  06/30/21 2:43 PM Phone: [phone] Fax: [phone]

## 2021-06-30 ENCOUNTER — Encounter: Payer: Self-pay | Admitting: Physical Therapy

## 2021-06-30 ENCOUNTER — Other Ambulatory Visit: Payer: Self-pay

## 2021-06-30 ENCOUNTER — Ambulatory Visit: Payer: Medicare Other | Admitting: Physical Therapy

## 2021-06-30 DIAGNOSIS — M6281 Muscle weakness (generalized): Secondary | ICD-10-CM

## 2021-06-30 DIAGNOSIS — M25561 Pain in right knee: Secondary | ICD-10-CM | POA: Diagnosis not present

## 2021-06-30 NOTE — Patient Instructions (Signed)
Access Code: [email or web address] URL: [email or web address] Date: 06/30/2021 Prepared by: Voncille Lo  Program Notes Goblet squat starts elevated with chair.   Exercises Goblet Squat with Kettlebell - 1 x daily - 7 x weekly - 3 sets - 10 reps Supine Hip Flexion with Resistance Loop - 1 x daily - 7 x weekly - 3 sets - 10 reps Side Stepping with Resistance at Ankles - 1 x daily - 7 x weekly - 3 sets - 10 reps Single Leg Heel Raise with Counter Support - 1 x daily - 7 x weekly - 1 sets - 30 reps Hip Extension with Resistance Loop - 1 x daily - 7 x weekly - 3 sets - 10 reps Hip Abduction with Resistance Loop - 1 x daily - 7 x weekly - 3 sets - 10 reps   Voncille Lo, PT, [facility] Certified Exercise Expert for the Aging Adult  06/30/21 11:40 AM Phone: [phone] Fax: [phone]

## 2021-07-04 ENCOUNTER — Encounter: Payer: Self-pay | Admitting: Physical Therapy

## 2021-07-04 ENCOUNTER — Other Ambulatory Visit: Payer: Self-pay

## 2021-07-04 ENCOUNTER — Ambulatory Visit: Payer: Medicare Other | Admitting: Physical Therapy

## 2021-07-04 DIAGNOSIS — M25561 Pain in right knee: Secondary | ICD-10-CM

## 2021-07-04 DIAGNOSIS — M6281 Muscle weakness (generalized): Secondary | ICD-10-CM

## 2021-07-04 NOTE — Therapy (Signed)
OUTPATIENT PHYSICAL THERAPY TREATMENT NOTE   Patient Name: Lisa Bauer MRN: [id number] DOB:[birth year], 66 y.o., female Today's Date: 07/04/2021  PCP: Wenda Low, MD REFERRING PROVIDER: Wenda Low, MD   PT End of Session - 07/04/21 0849     Visit Number 3    Number of Visits 12    Date for PT Re-Evaluation 08/04/21    Authorization Type [employer] Primary MCR  6th foto/ 10th progress note    Progress Note Due on Visit 10    PT Start Time 0846    PT Stop Time 0930    PT Time Calculation (min) 44 min             Past Medical History:  Diagnosis Date   Hypertension    Liver abscess 01/2018   bulb drain in place   Past Surgical History:  Procedure Laterality Date   IR RADIOLOGIST EVAL & MGMT  02/05/2018   IR THORACENTESIS ASP PLEURAL SPACE W/IMG GUIDE  02/06/2018   Patient Active Problem List   Diagnosis Date Noted   Hypertensive disorder 02/05/2018   Sciatica 02/05/2018   Uterine leiomyoma 02/05/2018   ARF (acute renal failure) ([place]) 01/11/2018   Severe sepsis with septic shock ([place]) 01/11/2018   Bacteremia due to Streptococcus 01/11/2018   Elevated liver function tests 01/11/2018   Anemia 01/11/2018   Hepatic abscess 01/09/2018   Bursitis of hip 08/09/2017    REFERRING DIAG: M25.569 (ICD-10-CM) - Knee pain  THERAPY DIAG:  Acute pain of right knee  Muscle weakness (generalized)  PERTINENT HISTORY: OA,Hepatic Abscess 2019 with ARF and sepsis, anemia , bursitis of hip, Sciatica  PRECAUTIONS: None  SUBJECTIVE: I am doing my exercises. I still have 3-4/10 pain with getting up from chair and with going up stairs.    PAIN:  Are you having pain? Yes NPRS scale: 0/10 at rest   going up stairs 4/10,  Squatting 4/10  Pain location: R knee Pain orientation: Anterior and Upper  pole of patella PAIN TYPE: sharp and stabbing at injury   now aching Pain description: intermittent  Aggravating factors: going up stairs, wt bearing in and out of  chairs Relieving factors: Ice  elevation. Iburprofen      OBJECTIVE:  All objective findings taken on eval unless otherwise noted   DIAGNOSTIC FINDINGS: None for knee   PATIENT SURVEYS:  FOTO eval 72% predicted 79%   COGNITION:          Overall cognitive status: Within functional limits for tasks assessed                        SENSATION:          Light touch: Appears intact          Stereognosis: Appears intact          Hot/Cold: Appears intact          Proprioception: Appears intact   MUSCLE LENGTH: Hamstrings: WNL Thomas test: WNL   POSTURE:  Slender  slight forward head   PALPATION: TTP -slight tenderness over superior pole of patella but generally no pain   LE AROM/PROM:   A/PROM Right 06/23/2021 Left 06/23/2021  Hip flexion 110 112  Hip extension      Hip abduction      Hip adduction      Hip internal rotation      Hip external rotation      Knee flexion 140/P142 140 P144  Knee extension  0 0  Ankle dorsiflexion      Ankle plantarflexion      Ankle inversion      Ankle eversion       (Blank rows = not tested)   LE MMT:   MMT Right 06/23/2021 Left 06/23/2021  Hip flexion 4+/5 4+/5  Hip extension 4+/5 4+/5  Hip abduction 4-/5 4-/5  Hip adduction      Hip internal rotation      Hip external rotation      Knee flexion 4+/5 4+/5  Knee extension 4+/5 4+/5  Ankle dorsiflexion      Ankle plantarflexion 5/5 5/5  Ankle inversion      Ankle eversion       (Blank rows = not tested)   LOWER EXTREMITY SPECIAL TESTS:  Knee special tests: Anterior drawer test: negative, Thessaly test: negative, and Step up/down test: positive  Pain on R and knee valgus with step down.   FUNCTIONAL TESTS:  5 times sit to stand: 16.76 sec Squat- pt pitches forward and has reduced Hip hinge and inability  to squat at knee flexion 90 degrees ( 60 degrees)   GAIT: Distance walked: 150 Assistive device utilized: None Level of assistance: Complete Independence Comments:  Normal gait         TODAY'S TREATMENT:  [facility] Adult PT Treatment:                                                DATE: 07-04-21 Therapeutic Exercise:  Elevated seat with there ex cushion  Goblet squat 10# KB 2x10  Manual: tape applied   Hip hinge training with wooden dowel for squat vs deadlift  Dead lift 10# x 10, Deadlift 25# 10 x 2   Step up 4 inch forward x 10 , lateral x 10- no pain with tape  Attempted 6 inch step up- increased pain with tape   Single Leg Heel Raise with Counter Support -1 sets - 30 reps  3 way hip while maintaining SLS on foam oval  Manual Therapy:  McConnell taping med to lateral pull bil knees  pain decreased with squats/step     [facility] Adult PT Treatment:                                                DATE: 06-30-21 Therapeutic Exercise: Elevated seat with there ex cushion  box squat 1 x 10 2/10 pain in knees Box squat without there ex cushion and taping of knees 1 x 10 Box squat with 10# KB 3 x 10  Side Stepping with Resistance at Ankles 3 x10 reps Single Leg Heel Raise with Counter Support -1 sets - 30 reps Hip Extension with Resistance Loop  3 sets - 10 reps Hip Abduction with Resistance Loop -3 sets  Standing hip flexion with GTB Manual Therapy: McConnell taping med to lateral pull bil knees  pain decreased with squats      PATIENT EDUCATION:  Education details: updated HEP with 4inch step up and deadlift Person educated: Patient Education method: Explanation, Demonstration, Tactile cues, and Verbal cues Education comprehension: verbalized understanding, returned demonstration, and needs further education     HOME EXERCISE PROGRAM: Access Code: [email or web address] URL: [email or web address] Date: 06/30/2021 Prepared  by: Voncille Lo  Program Notes Goblet squat starts elevated with chair.   Exercises Goblet Squat with Kettlebell - 1 x daily - 7 x weekly - 3 sets - 10 reps Supine Hip Flexion with Resistance Loop - 1 x daily - 7  x weekly - 3 sets - 10 reps Side Stepping with Resistance at Ankles - 1 x daily - 7 x weekly - 3 sets - 10 reps Single Leg Heel Raise with Counter Support - 1 x daily - 7 x weekly - 1 sets - 30 reps Hip Extension with Resistance Loop - 1 x daily - 7 x weekly - 3 sets - 10 reps Hip Abduction with Resistance Loop - 1 x daily - 7 x weekly - 3 sets - 10 reps    ASSESSMENT:   CLINICAL IMPRESSION: Lisa Bauer enters clinic with 4/10 pain and continues to have pain with squatting maneuvers and stairs.   Chair squats still painful at start of session so Mcconnel tape was applied to medially track patellas. Afterward pain was reduced with squats. Began dead lifting with only min cues for form and maintaining neutral spine through neck. Able to lift 25# KB without knee pain.    REHAB POTENTIAL: Excellent   CLINICAL DECISION MAKING: Stable/uncomplicated   EVALUATION COMPLEXITY: Low     GOALS: Goals reviewed with patient? Yes   SHORT TERM GOALS:   STG Name Target Date Goal status  1 Pt will be independent with HEP Baseline: limited knowledge 07/14/2021 INITIAL  2 Pt will be able to show reduced risk of fall with 5 x STS 13.0 sec or less Baseline:  eval 16.76 sec 07/14/2021 INITIAL  3 Pain will decrease to 1/10 with all functional activities Baseline: pain to 4/10 with exercise and daily activities 07/14/2021 INITIAL  4 Pt will return to walking 3 times a week for at least 3 miles Baseline: Pt has stopped walking due to fear of injuring knee 07/14/2021 INITIALA    LONG TERM GOALS:    LTG Name Target Date Goal status  1 Pt will be independent with advanced HEP Baseline:no knowledge of progressive overload 08/04/2021 INITIAL  2 Pt will be able to demonstrate Goblet squats with 90/90 hip flex and knee while utilizing 25 # or more with symmetrical form Baseline: unable to squat more than 60 degrees knee flex without pitching trunk forward 08/04/2021 INITIAL  3 Pt will be able to deadlift  55 lb to show  increased LE strength Baseline: 0 deadlift 08/04/2021 INITIAL  4 Pt will be able to negotiate stairs without exacerbating pain. Baseline: eval 4/10 pain 08/04/2021 INITIAL  5 FOTO will improve from 72%   to  79 %  indicating improved functional mobility Baseline:Eval 72% 08/04/2021 INITIAL  6 Pt will demonstrate floor to stand transfer to show evidence of reduced fall risk and to be able to participate in yoga and pilates pain free Baseline: Pt is hesitant to bend knee due to pain in flexion   08/04/2021 INITIAL    PLAN: PT FREQUENCY: 2x/week   PT DURATION: 6 weeks   PLANNED INTERVENTIONS: Therapeutic exercises, Therapeutic activity, Neuro Muscular re-education, Balance training, Gait training, Patient/Family education, Joint mobilization, Stair training, Dry Needling, Electrical stimulation, Cryotherapy, Moist heat, Taping, Ionotophoresis 4mg /ml Dexamethasone, and Manual therapy   PLAN FOR NEXT SESSION: check taping  teach taping if effective TKE,  work on deadlift and proper squat  Goals    Hessie Diener, PTA 07/04/21 10:15 AM Phone: [phone] Fax: [phone]

## 2021-07-07 ENCOUNTER — Encounter: Payer: Self-pay | Admitting: Physical Therapy

## 2021-07-07 ENCOUNTER — Ambulatory Visit: Payer: Medicare Other | Attending: Internal Medicine | Admitting: Physical Therapy

## 2021-07-07 ENCOUNTER — Other Ambulatory Visit: Payer: Self-pay

## 2021-07-07 ENCOUNTER — Ambulatory Visit: Payer: Medicare Other | Admitting: Physical Therapy

## 2021-07-07 DIAGNOSIS — M6281 Muscle weakness (generalized): Secondary | ICD-10-CM | POA: Diagnosis present

## 2021-07-07 DIAGNOSIS — M25561 Pain in right knee: Secondary | ICD-10-CM | POA: Insufficient documentation

## 2021-07-07 NOTE — Therapy (Signed)
OUTPATIENT PHYSICAL THERAPY TREATMENT NOTE   Patient Name: Lisa Bauer MRN: [id number] DOB:1955/11/17, 66 y.o., female Today's Date: 07/07/2021  PCP: Wenda Low, MD REFERRING PROVIDER: Wenda Low, MD   PT End of Session - 07/07/21 0813     Visit Number 4    Number of Visits 12    Date for PT Re-Evaluation 08/04/21    Authorization Type [employer] Primary MCR  6th foto/ 10th progress note    Progress Note Due on Visit 10    PT Start Time 0809    PT Stop Time 0855    PT Time Calculation (min) 46 min    Activity Tolerance Patient tolerated treatment well    Behavior During Therapy [facility] for tasks assessed/performed              Past Medical History:  Diagnosis Date   Hypertension    Liver abscess 01/2018   bulb drain in place   Past Surgical History:  Procedure Laterality Date   IR RADIOLOGIST EVAL & MGMT  02/05/2018   IR THORACENTESIS ASP PLEURAL SPACE W/IMG GUIDE  02/06/2018   Patient Active Problem List   Diagnosis Date Noted   Hypertensive disorder 02/05/2018   Sciatica 02/05/2018   Uterine leiomyoma 02/05/2018   ARF (acute renal failure) ([place]) 01/11/2018   Severe sepsis with septic shock ([place]) 01/11/2018   Bacteremia due to Streptococcus 01/11/2018   Elevated liver function tests 01/11/2018   Anemia 01/11/2018   Hepatic abscess 01/09/2018   Bursitis of hip 08/09/2017    REFERRING DIAG: M25.569 (ICD-10-CM) - Knee pain  THERAPY DIAG:  Acute pain of right knee  Muscle weakness (generalized)  PERTINENT HISTORY: OA,Hepatic Abscess 2019 with ARF and sepsis, anemia , bursitis of hip, Sciatica  PRECAUTIONS: None  SUBJECTIVE:  Taping is helping.  I have been doing the exercises and was more sore have exercises.  I used ice afterwards.    PAIN:  Are you having pain? Yes NPRS scale: 0/10 at rest   going up stairs 3/10,  Squatting 5/10  Pain location: R knee Pain orientation: Anterior and Upper  pole of patella PAIN TYPE: sharp and stabbing at  injury   now aching Pain description: intermittent  Aggravating factors: going up stairs, wt bearing in and out of chairs Relieving factors: Ice  elevation. Iburprofen      OBJECTIVE:  All objective findings taken on eval unless otherwise noted   DIAGNOSTIC FINDINGS: None for knee   PATIENT SURVEYS:  FOTO eval 72% predicted 79%   COGNITION:          Overall cognitive status: Within functional limits for tasks assessed                        SENSATION:          Light touch: Appears intact          Stereognosis: Appears intact          Hot/Cold: Appears intact          Proprioception: Appears intact   MUSCLE LENGTH: Hamstrings: WNL Thomas test: WNL   POSTURE:  Slender  slight forward head   PALPATION: TTP -slight tenderness over superior pole of patella but generally no pain   LE AROM/PROM:   A/PROM Right 06/23/2021 Left 06/23/2021  Hip flexion 110 112  Hip extension      Hip abduction      Hip adduction      Hip internal  rotation      Hip external rotation      Knee flexion 140/P142 140 P144  Knee extension 0 0  Ankle dorsiflexion      Ankle plantarflexion      Ankle inversion      Ankle eversion       (Blank rows = not tested)   LE MMT:   MMT Right 06/23/2021 Left 06/23/2021  Hip flexion 4+/5 4+/5  Hip extension 4+/5 4+/5  Hip abduction 4-/5 4-/5  Hip adduction      Hip internal rotation      Hip external rotation      Knee flexion 4+/5 4+/5  Knee extension 4+/5 4+/5  Ankle dorsiflexion      Ankle plantarflexion 5/5 5/5  Ankle inversion      Ankle eversion       (Blank rows = not tested)   LOWER EXTREMITY SPECIAL TESTS:  Knee special tests: Anterior drawer test: negative, Thessaly test: negative, and Step up/down test: positive  Pain on R and knee valgus with step down.   FUNCTIONAL TESTS:  5 times sit to stand: 16.76 sec Squat- pt pitches forward and has reduced Hip hinge and inability  to squat at knee flexion 90 degrees ( 60 degrees)    GAIT: Distance walked: 150 Assistive device utilized: None Level of assistance: Complete Independence Comments: Normal gait         TODAY'S TREATMENT:  [facility] Adult PT Treatment:                                                DATE: 07/07/21 Therapeutic Exercise: Following exercise post Mc Connell taping Mini Emom  4 rounds     Goblet squat (regular seat 18 inch)15 # KB x 10 Single arm lat pul  BTB x 15 R and L Step ups on 8 inch step 10 on R and 10 on L  ( can begin carrying KB starting at 10 lb and progressing) Front plank  up to a 30 sec up to  minute  Side plank 30 sec L and 30 sec R VC for position Kickstand deadlift with 5 lb wt on L and R  Self Care:  Educated on how to do [employer] taping at home (technique and supplies) with return demo x 3. Pt did R knee and pt did L knee. McConnell taping med to lateral pull bil knees  pain decreased with squats/step  [facility] Adult PT Treatment:                                                DATE: 07-04-21 Therapeutic Exercise:  Elevated seat with there ex cushion  Goblet squat 10# KB 2x10  Manual: tape applied   Hip hinge training with wooden dowel for squat vs deadlift  Dead lift 10# x 10, Deadlift 25# 10 x 2   Step up 4 inch forward x 10 , lateral x 10- no pain with tape  Attempted 6 inch step up- increased pain with tape   Single Leg Heel Raise with Counter Support -1 sets - 30 reps  3 way hip while maintaining SLS on foam oval  Manual Therapy:  McConnell taping med to lateral pull bil  knees  pain decreased with squats/step     [facility] Adult PT Treatment:                                                DATE: 06-30-21 Therapeutic Exercise: Elevated seat with there ex cushion  box squat 1 x 10 2/10 pain in knees Box squat without there ex cushion and taping of knees 1 x 10 Box squat with 10# KB 3 x 10  Side Stepping with Resistance at Ankles 3 x10 reps Single Leg Heel Raise with Counter Support -1 sets - 30 reps Hip Extension with  Resistance Loop  3 sets - 10 reps Hip Abduction with Resistance Loop -3 sets  Standing hip flexion with GTB Manual Therapy: McConnell taping med to lateral pull bil knees  pain decreased with squats      PATIENT EDUCATION:  Education details: updated HEP with 4inch step up and deadlift/ McConnell taping education with demo for home use Person educated: Patient Education method: Explanation, Demonstration, Tactile cues, and Verbal cues Education comprehension: verbalized understanding, returned demonstration, and needs further education     HOME EXERCISE PROGRAM: Access Code: [email or web address] URL: [email or web address] Date: 06/30/2021 Prepared by: Voncille Lo  Program Notes  EMOM Goblet squat starts elevated with chair.  Goblet squat (regular seat 18 inch)15 # KB x 10 Single arm lat pul  BTB x 15 R and L Step ups on 8 inch step 10 on R and 10 on L  ( can begin carrying KB starting at 10 lb and progressing) Front plank  up to a 30 sec up to  minute   Exercises Goblet Squat with Kettlebell - 1 x daily - 7 x weekly - 3 sets - 10 reps Supine Hip Flexion with Resistance Loop - 1 x daily - 7 x weekly - 3 sets - 10 reps Side Stepping with Resistance at Ankles - 1 x daily - 7 x weekly - 3 sets - 10 reps Single Leg Heel Raise with Counter Support - 1 x daily - 7 x weekly - 1 sets - 30 reps Hip Extension with Resistance Loop - 1 x daily - 7 x weekly - 3 sets - 10 reps Hip Abduction with Resistance Loop - 1 x daily - 7 x weekly - 3 sets - 10 reps    ASSESSMENT:   CLINICAL IMPRESSION: Ms Beyersdorf enters clinic with 3/10 pain and decreasing pain with squatting maneuvers and stairs but still present.  Pt is educated on McConnell taping for home use to medially track patella without pain with exercise.  Pt goblet squat with 15# KB . Educated on EMOM exercise to build endurance and strength Afterward pain was reduced with squats.STG # 1 achieved Pt independent with intial HEP  Continue POC for completion of goals.   REHAB POTENTIAL: Excellent   CLINICAL DECISION MAKING: Stable/uncomplicated   EVALUATION COMPLEXITY: Low     GOALS: Goals reviewed with patient? Yes   SHORT TERM GOALS:   STG Name Target Date Goal status  1 Pt will be independent with HEP Baseline: limited knowledge 07/14/2021 Achieved  2 Pt will be able to show reduced risk of fall with 5 x STS 13.0 sec or less Baseline:  eval 16.76 sec 07/14/2021 INITIAL  3 Pain will decrease to 1/10 with all functional activities Baseline: pain to 4/10 with exercise and daily  activities 07/14/2021 INITIAL  4 Pt will return to walking 3 times a week for at least 3 miles Baseline: Pt has stopped walking due to fear of injuring knee 07/14/2021 INITIAL    LONG TERM GOALS:    LTG Name Target Date Goal status  1 Pt will be independent with advanced HEP Baseline:no knowledge of progressive overload 08/04/2021 INITIAL  2 Pt will be able to demonstrate Goblet squats with 90/90 hip flex and knee while utilizing 25 # or more with symmetrical form Baseline: unable to squat more than 60 degrees knee flex without pitching trunk forward 08/04/2021 INITIAL  3 Pt will be able to deadlift  55 lb to show increased LE strength Baseline: 0 deadlift 08/04/2021 INITIAL  4 Pt will be able to negotiate stairs without exacerbating pain. Baseline: eval 4/10 pain 08/04/2021 INITIAL  5 FOTO will improve from 72%   to  79 %  indicating improved functional mobility Baseline:Eval 72% 08/04/2021 INITIAL  6 Pt will demonstrate floor to stand transfer to show evidence of reduced fall risk and to be able to participate in yoga and pilates pain free Baseline: Pt is hesitant to bend knee due to pain in flexion   08/04/2021 INITIAL    PLAN: PT FREQUENCY: 2x/week   PT DURATION: 6 weeks   PLANNED INTERVENTIONS: Therapeutic exercises, Therapeutic activity, Neuro Muscular re-education, Balance training, Gait training, Patient/Family education, Joint  mobilization, Stair training, Dry Needling, Electrical stimulation, Cryotherapy, Moist heat, Taping, Ionotophoresis 4mg /ml Dexamethasone, and Manual therapy   PLAN FOR NEXT SESSION: , step ups and step downs TKE,  work on deadlift and proper squat  Goals   Voncille Lo, PT, [place] Certified Exercise Expert for the Aging Adult  07/07/21 9:42 AM Phone: [phone] Fax: [phone]

## 2021-07-07 NOTE — Patient Instructions (Addendum)
Exercise EMOM ( Every minute on the minute)  3 to 5 rounds to build endurance and strength  Goblet squat 15 # KB x 10 Single arm lat pul  BTB x 15 R and L Step ups on 8 inch step !0 on R and 10 on L  ( can begin carrying KB starting at 10 lb and progressing) Front plank  up to a minute   Voncille Lo, PT, [facility] Certified Exercise Expert for the Aging Adult  07/07/21 8:48 AM Phone: [phone] Fax: [phone]

## 2021-07-11 ENCOUNTER — Other Ambulatory Visit: Payer: Self-pay

## 2021-07-11 ENCOUNTER — Ambulatory Visit: Payer: Medicare Other | Admitting: Physical Therapy

## 2021-07-11 ENCOUNTER — Encounter: Payer: Self-pay | Admitting: Physical Therapy

## 2021-07-11 DIAGNOSIS — M6281 Muscle weakness (generalized): Secondary | ICD-10-CM

## 2021-07-11 DIAGNOSIS — M25561 Pain in right knee: Secondary | ICD-10-CM

## 2021-07-11 NOTE — Therapy (Addendum)
OUTPATIENT PHYSICAL THERAPY TREATMENT NOTE   Patient Name: Lisa Bauer MRN: [id number] DOB:15-Mar-1956, 66 y.o., female Today's Date: 07/11/2021  PCP: Wenda Low, MD REFERRING PROVIDER: Wenda Low, MD   PT End of Session - 07/11/21 0902     Visit Number 5    Number of Visits 12    Date for PT Re-Evaluation 08/04/21    Authorization Type [employer] Primary MCR  6th foto/ 10th progress note    Progress Note Due on Visit 10    PT Start Time 0855   10 min late- went to wrong appt   PT Stop Time 0928    PT Time Calculation (min) 33 min              Past Medical History:  Diagnosis Date   Hypertension    Liver abscess 01/2018   bulb drain in place   Past Surgical History:  Procedure Laterality Date   IR RADIOLOGIST EVAL & MGMT  02/05/2018   IR THORACENTESIS ASP PLEURAL SPACE W/IMG GUIDE  02/06/2018   Patient Active Problem List   Diagnosis Date Noted   Hypertensive disorder 02/05/2018   Sciatica 02/05/2018   Uterine leiomyoma 02/05/2018   ARF (acute renal failure) ([place]) 01/11/2018   Severe sepsis with septic shock ([place]) 01/11/2018   Bacteremia due to Streptococcus 01/11/2018   Elevated liver function tests 01/11/2018   Anemia 01/11/2018   Hepatic abscess 01/09/2018   Bursitis of hip 08/09/2017    REFERRING DIAG: M25.569 (ICD-10-CM) - Knee pain  THERAPY DIAG:  Acute pain of right knee  Muscle weakness (generalized)  PERTINENT HISTORY: OA,Hepatic Abscess 2019 with ARF and sepsis, anemia , bursitis of hip, Sciatica  PRECAUTIONS: None  SUBJECTIVE:  I feel stronger getting up and down but I still have knee pain.    PAIN:  Are you having pain? No NPRS scale: 0/10 at rest   going up stairs 3/10,  Squatting 5/10  Pain location: R knee Pain orientation: Anterior and Upper  pole of patella PAIN TYPE: sharp and stabbing at injury   now aching Pain description: intermittent  Aggravating factors: going up stairs, wt bearing in and out of chairs Relieving  factors: Ice  elevation. Iburprofen      OBJECTIVE:  All objective findings taken on eval unless otherwise noted   DIAGNOSTIC FINDINGS: None for knee   PATIENT SURVEYS:  FOTO eval 72% predicted 79%   COGNITION:          Overall cognitive status: Within functional limits for tasks assessed                        SENSATION:          Light touch: Appears intact          Stereognosis: Appears intact          Hot/Cold: Appears intact          Proprioception: Appears intact   MUSCLE LENGTH: Hamstrings: WNL Thomas test: WNL   POSTURE:  Slender  slight forward head   PALPATION: TTP -slight tenderness over superior pole of patella but generally no pain   LE AROM/PROM:   A/PROM Right 06/23/2021 Left 06/23/2021  Hip flexion 110 112  Hip extension      Hip abduction      Hip adduction      Hip internal rotation      Hip external rotation      Knee flexion 140/P142 140 P144  Knee extension 0 0  Ankle dorsiflexion      Ankle plantarflexion      Ankle inversion      Ankle eversion       (Blank rows = not tested)   LE MMT:   MMT Right 06/23/2021 Left 06/23/2021 Right  Left  Hip flexion 4+/5 4+/5    Hip extension 4+/5 4+/5    Hip abduction 4-/5 4-/5 4-/5 4-/5  Hip adduction        Hip internal rotation        Hip external rotation        Knee flexion 4+/5 4+/5    Knee extension 4+/5 4+/5    Ankle dorsiflexion        Ankle plantarflexion 5/5 5/5    Ankle inversion        Ankle eversion         (Blank rows = not tested)   LOWER EXTREMITY SPECIAL TESTS:  Knee special tests: Anterior drawer test: negative, Thessaly test: negative, and Step up/down test: positive  Pain on R and knee valgus with step down.   FUNCTIONAL TESTS:  5 times sit to stand: 16.76 sec Squat- pt pitches forward and has reduced Hip hinge and inability  to squat at knee flexion 90 degrees ( 60 degrees)   GAIT: Distance walked: 150 Assistive device utilized: None Level of assistance: Complete  Independence Comments: Normal gait         TODAY'S TREATMENT:  [facility] Adult PT Treatment:                                                DATE: 07/11/21 Therapeutic Exercise: Side hip abduction circles -stir the pot 10 x 2 each  Side clam green band 10 x 2 each Single leg bridge x 10 each Bridge with blue band clam x 15  Side stepping squat with blue band around knees  Goblet squat 15# with blue band at knees tap to mat x 15 reps  Step ups with blue band resistance from PTA -pt pulling lateral 10 x 2 each 3/10 pain Front plank 60 sec Side plank knees and elbow 30 sec , bilat  Deadlift 30# 10 x 2   [facility] Adult PT Treatment:                                                DATE: 07/07/21 Therapeutic Exercise: Following exercise post Mc Connell taping Mini Emom  4 rounds     Goblet squat (regular seat 18 inch)15 # KB x 10 Single arm lat pul  BTB x 15 R and L Step ups on 8 inch step 10 on R and 10 on L  ( can begin carrying KB starting at 10 lb and progressing) Front plank  up to a 30 sec up to  minute  Side plank 30 sec L and 30 sec R VC for position Kickstand deadlift with 5 lb wt on L and R  Self Care:  Educated on how to do [employer] taping at home (technique and supplies) with return demo x 3. Pt did R knee and pt did L knee. McConnell taping med to lateral pull bil knees  pain decreased with squats/step  [facility] Adult PT Treatment:                                                DATE: 07-04-21 Therapeutic Exercise:  Elevated seat with there ex cushion  Goblet squat 10# KB 2x10  Manual: tape applied   Hip hinge training with wooden dowel for squat vs deadlift  Dead lift 10# x 10, Deadlift 25# 10 x 2   Step up 4 inch forward x 10 , lateral x 10- no pain with tape  Attempted 6 inch step up- increased pain with tape   Single Leg Heel Raise with Counter Support -1 sets - 30 reps  3 way hip while maintaining SLS on foam oval  Manual Therapy:  McConnell taping med to lateral pull bil  knees  pain decreased with squats/step     [facility] Adult PT Treatment:                                                DATE: 06-30-21 Therapeutic Exercise: Elevated seat with there ex cushion  box squat 1 x 10 2/10 pain in knees Box squat without there ex cushion and taping of knees 1 x 10 Box squat with 10# KB 3 x 10  Side Stepping with Resistance at Ankles 3 x10 reps Single Leg Heel Raise with Counter Support -1 sets - 30 reps Hip Extension with Resistance Loop  3 sets - 10 reps Hip Abduction with Resistance Loop -3 sets  Standing hip flexion with GTB Manual Therapy: McConnell taping med to lateral pull bil knees  pain decreased with squats      PATIENT EDUCATION:  Education details: continue HEP- add blue band to squats Person educated: Patient Education method: Explanation, Demonstration, Tactile cues, and Verbal cues Education comprehension: verbalized understanding, returned demonstration, and needs further education     HOME EXERCISE PROGRAM: Access Code: [email or web address] URL: [email or web address] Date: 06/30/2021 Prepared by: Voncille Lo  Program Notes  EMOM Goblet squat starts elevated with chair.  Goblet squat (regular seat 18 inch)15 # KB x 10 Single arm lat pul  BTB x 15 R and L Step ups on 8 inch step 10 on R and 10 on L  ( can begin carrying KB starting at 10 lb and progressing) Front plank  up to a 30 sec up to  minute   Exercises Goblet Squat with Kettlebell - 1 x daily - 7 x weekly - 3 sets - 10 reps Supine Hip Flexion with Resistance Loop - 1 x daily - 7 x weekly - 3 sets - 10 reps Side Stepping with Resistance at Ankles - 1 x daily - 7 x weekly - 3 sets - 10 reps Single Leg Heel Raise with Counter Support - 1 x daily - 7 x weekly - 1 sets - 30 reps Hip Extension with Resistance Loop - 1 x daily - 7 x weekly - 3 sets - 10 reps Hip Abduction with Resistance Loop - 1 x daily - 7 x weekly - 3 sets - 10 reps    ASSESSMENT:   CLINICAL  IMPRESSION: Pt reports she did not buy mcConnel tape yet. She reports feeling stronger with sit-stands however pain still 3/10. Hip abduction strength -  no change. Worked on incorporating lateral hip strength into squats and reviewed lateral stepping with bands. She may be compensating during her Pilates lateral hips routines and proper sidelying form was reviewed with pt. Able to progress to 30# dead lifting without increased knee pain.    REHAB POTENTIAL: Excellent   CLINICAL DECISION MAKING: Stable/uncomplicated   EVALUATION COMPLEXITY: Low     GOALS: Goals reviewed with patient? Yes   SHORT TERM GOALS:   STG Name Target Date Goal status  1 Pt will be independent with HEP Baseline: limited knowledge 07/14/2021 Achieved  2 Pt will be able to show reduced risk of fall with 5 x STS 13.0 sec or less Baseline:  eval 16.76 sec 07/14/2021 INITIAL  3 Pain will decrease to 1/10 with all functional activities Baseline: pain to 4/10 with exercise and daily activities 07/14/2021 INITIAL  4 Pt will return to walking 3 times a week for at least 3 miles Baseline: Pt has stopped walking due to fear of injuring knee 07/14/2021 INITIAL    LONG TERM GOALS:    LTG Name Target Date Goal status  1 Pt will be independent with advanced HEP Baseline:no knowledge of progressive overload 08/04/2021 INITIAL  2 Pt will be able to demonstrate Goblet squats with 90/90 hip flex and knee while utilizing 25 # or more with symmetrical form Baseline: unable to squat more than 60 degrees knee flex without pitching trunk forward 08/04/2021 INITIAL  3 Pt will be able to deadlift  55 lb to show increased LE strength Baseline: 0 deadlift 08/04/2021 INITIAL  4 Pt will be able to negotiate stairs without exacerbating pain. Baseline: eval 4/10 pain 08/04/2021 INITIAL  5 FOTO will improve from 72%   to  79 %  indicating improved functional mobility Baseline:Eval 72% 08/04/2021 INITIAL  6 Pt will demonstrate floor to stand transfer to show  evidence of reduced fall risk and to be able to participate in yoga and pilates pain free Baseline: Pt is hesitant to bend knee due to pain in flexion   08/04/2021 INITIAL    PLAN: PT FREQUENCY: 2x/week   PT DURATION: 6 weeks   PLANNED INTERVENTIONS: Therapeutic exercises, Therapeutic activity, Neuro Muscular re-education, Balance training, Gait training, Patient/Family education, Joint mobilization, Stair training, Dry Needling, Electrical stimulation, Cryotherapy, Moist heat, Taping, Ionotophoresis 4mg /ml Dexamethasone, and Manual therapy   PLAN FOR NEXT SESSION: , lateral hip strength , step ups and step downs TKE,  work on deadlift and proper squat  [place], PTA 07/11/21 9:03 AM Phone: [phone] Fax: [phone]

## 2021-07-14 ENCOUNTER — Ambulatory Visit: Payer: Medicare Other | Admitting: Physical Therapy

## 2021-07-14 ENCOUNTER — Encounter: Payer: Self-pay | Admitting: Physical Therapy

## 2021-07-14 ENCOUNTER — Other Ambulatory Visit: Payer: Self-pay

## 2021-07-14 DIAGNOSIS — M6281 Muscle weakness (generalized): Secondary | ICD-10-CM

## 2021-07-14 DIAGNOSIS — M25561 Pain in right knee: Secondary | ICD-10-CM

## 2021-07-14 NOTE — Therapy (Signed)
OUTPATIENT PHYSICAL THERAPY TREATMENT NOTE   Patient Name: Lisa Bauer MRN: [id number] DOB:03/06/1956, 66 y.o., female Today's Date: 07/14/2021  PCP: Wenda Low, MD REFERRING PROVIDER: Wenda Low, MD   PT End of Session - 07/14/21 0940     Visit Number 6    Number of Visits 12    Date for PT Re-Evaluation 08/04/21    Authorization Type [employer] Primary MCR  6th foto/ 10th progress note    PT Start Time 0933    PT Stop Time 1013    PT Time Calculation (min) 40 min              Past Medical History:  Diagnosis Date   Hypertension    Liver abscess 01/2018   bulb drain in place   Past Surgical History:  Procedure Laterality Date   IR RADIOLOGIST EVAL & MGMT  02/05/2018   IR THORACENTESIS ASP PLEURAL SPACE W/IMG GUIDE  02/06/2018   Patient Active Problem List   Diagnosis Date Noted   Hypertensive disorder 02/05/2018   Sciatica 02/05/2018   Uterine leiomyoma 02/05/2018   ARF (acute renal failure) ([place]) 01/11/2018   Severe sepsis with septic shock ([place]) 01/11/2018   Bacteremia due to Streptococcus 01/11/2018   Elevated liver function tests 01/11/2018   Anemia 01/11/2018   Hepatic abscess 01/09/2018   Bursitis of hip 08/09/2017    REFERRING DIAG: M25.569 (ICD-10-CM) - Knee pain  THERAPY DIAG:  Acute pain of right knee  Muscle weakness (generalized)  PERTINENT HISTORY: OA,Hepatic Abscess 2019 with ARF and sepsis, anemia , bursitis of hip, Sciatica  PRECAUTIONS: None  SUBJECTIVE:  I still have pain with stairs. I was sore in buttocks after last session.    PAIN:  Are you having pain? No NPRS scale: 0/10 at rest   going up stairs 3/10,   Pain location: R knee Pain orientation: Anterior and Upper  pole of patella PAIN TYPE: sharp and stabbing at injury   now aching Pain description: intermittent  Aggravating factors: going up stairs, wt bearing in and out of chairs Relieving factors: Ice  elevation. Iburprofen      OBJECTIVE:  All objective  findings taken on eval unless otherwise noted   DIAGNOSTIC FINDINGS: None for knee   PATIENT SURVEYS:  FOTO eval 72% predicted 79%   COGNITION:          Overall cognitive status: Within functional limits for tasks assessed                        SENSATION:          Light touch: Appears intact          Stereognosis: Appears intact          Hot/Cold: Appears intact          Proprioception: Appears intact   MUSCLE LENGTH: Hamstrings: WNL Thomas test: WNL   POSTURE:  Slender  slight forward head   PALPATION: TTP -slight tenderness over superior pole of patella but generally no pain   LE AROM/PROM:   A/PROM Right 06/23/2021 Left 06/23/2021  Hip flexion 110 112  Hip extension      Hip abduction      Hip adduction      Hip internal rotation      Hip external rotation      Knee flexion 140/P142 140 P144  Knee extension 0 0  Ankle dorsiflexion      Ankle plantarflexion  Ankle inversion      Ankle eversion       (Blank rows = not tested)   LE MMT:   MMT Right 06/23/2021 Left 06/23/2021 Right  07/11/21 Left 07/11/21  Hip flexion 4+/5 4+/5    Hip extension 4+/5 4+/5    Hip abduction 4-/5 4-/5 4-/5 4-/5  Hip adduction        Hip internal rotation        Hip external rotation        Knee flexion 4+/5 4+/5    Knee extension 4+/5 4+/5    Ankle dorsiflexion        Ankle plantarflexion 5/5 5/5    Ankle inversion        Ankle eversion         (Blank rows = not tested)   LOWER EXTREMITY SPECIAL TESTS:  Knee special tests: Anterior drawer test: negative, Thessaly test: negative, and Step up/down test: positive  Pain on R and knee valgus with step down.   FUNCTIONAL TESTS:  5 times sit to stand: 16.76 sec Squat- pt pitches forward and has reduced Hip hinge and inability  to squat at knee flexion 90 degrees ( 60 degrees)   GAIT: Distance walked: 150 Assistive device utilized: None Level of assistance: Complete Independence Comments: Normal gait         TODAY'S  TREATMENT:  [facility] Adult PT Treatment:                                                DATE: 07/11/21 Manual Therapy: Mcconnell tape to bilater knees  Therapeutic Exercise: Side hip abduction circles -stir the pot 10 x 2 each 2# Glut med side hip abduction x 10 2#  Side plank with  clam green band 10 x 1 each Single leg bridge x 15 each Static Bridge with blue band clam x 10x2  Side stepping squat with blue band around knees -10 ft x 6 Goblet squat 15# with blue band at knees tap to mat x 15 reps - no pain Step ups 6inch with opp knee drive x 15 each- no pain on right, min pain left 2/10 4 inch lateral step downs x 15 each 2/10 pain right  Deadlift 45# 10 x 2 Childs pose to end session   [facility] Adult PT Treatment:                                                DATE: 07/11/21 Therapeutic Exercise: Side hip abduction circles -stir the pot 10 x 2 each  Side clam green band 10 x 2 each Single leg bridge x 10 each Bridge with blue band clam x 15  Side stepping squat with blue band around knees  Goblet squat 15# with blue band at knees tap to mat x 15 reps  Step ups with blue band resistance from PTA -pt pulling lateral 10 x 2 each 3/10 pain Front plank 60 sec Side plank knees and elbow 30 sec , bilat  Deadlift 30# 10 x 2   [facility] Adult PT Treatment:  DATE: 07/07/21 Therapeutic Exercise: Following exercise post Mc Connell taping Mini Emom  4 rounds     Goblet squat (regular seat 18 inch)15 # KB x 10 Single arm lat pul  BTB x 15 R and L Step ups on 8 inch step 10 on R and 10 on L  ( can begin carrying KB starting at 10 lb and progressing) Front plank  up to a 30 sec up to  minute  Side plank 30 sec L and 30 sec R VC for position Kickstand deadlift with 5 lb wt on L and R  Self Care:  Educated on how to do [employer] taping at home (technique and supplies) with return demo x 3. Pt did R knee and pt did L knee. McConnell taping med to lateral pull  bil knees  pain decreased with squats/step  [facility] Adult PT Treatment:                                                DATE: 07-04-21 Therapeutic Exercise:  Elevated seat with there ex cushion  Goblet squat 10# KB 2x10  Manual: tape applied   Hip hinge training with wooden dowel for squat vs deadlift  Dead lift 10# x 10, Deadlift 25# 10 x 2   Step up 4 inch forward x 10 , lateral x 10- no pain with tape  Attempted 6 inch step up- increased pain with tape   Single Leg Heel Raise with Counter Support -1 sets - 30 reps  3 way hip while maintaining SLS on foam oval  Manual Therapy:  McConnell taping med to lateral pull bil knees  pain decreased with squats/step       PATIENT EDUCATION:  Education details: continue HEP- Person educated: Patient Education method: Explanation, Demonstration, Tactile cues, and Verbal cues Education comprehension: verbalized understanding, returned demonstration, and needs further education     HOME EXERCISE PROGRAM: Access Code: [email or web address] URL: [email or web address] Date: 06/30/2021 Prepared by: Voncille Lo  Program Notes  EMOM Goblet squat starts elevated with chair.  Goblet squat (regular seat 18 inch)15 # KB x 10 Single arm lat pul  BTB x 15 R and L Step ups on 8 inch step 10 on R and 10 on L  ( can begin carrying KB starting at 10 lb and progressing) Front plank  up to a 30 sec up to  minute   Exercises Goblet Squat with Kettlebell - 1 x daily - 7 x weekly - 3 sets - 10 reps Supine Hip Flexion with Resistance Loop - 1 x daily - 7 x weekly - 3 sets - 10 reps Side Stepping with Resistance at Ankles - 1 x daily - 7 x weekly - 3 sets - 10 reps Single Leg Heel Raise with Counter Support - 1 x daily - 7 x weekly - 1 sets - 30 reps Hip Extension with Resistance Loop - 1 x daily - 7 x weekly - 3 sets - 10 reps Hip Abduction with Resistance Loop - 1 x daily - 7 x weekly - 3 sets - 10 reps    ASSESSMENT:   CLINICAL IMPRESSION: Pt  arrives without pain but still with pain on stairs. Continued focus on lateral hip strength. Applied Mcconnel tape to decrease lateral tracking patellas. Able to complete weighted squats without pain. No pain with left 6 inch step ups  and 4 inch lateral step down. Pain rated 2/10 on the right knee with same activities. OVerall pain is decreasing with therex. Able to increased dead lift weight to 45# without pain. Childs pose at end of session to decrease lumbar tension.   REHAB POTENTIAL: Excellent   CLINICAL DECISION MAKING: Stable/uncomplicated   EVALUATION COMPLEXITY: Low     GOALS: Goals reviewed with patient? Yes   SHORT TERM GOALS:   STG Name Target Date Goal status  1 Pt will be independent with HEP Baseline: limited knowledge 07/14/2021 Achieved  2 Pt will be able to show reduced risk of fall with 5 x STS 13.0 sec or less Baseline:  eval 16.76 sec 07/14/2021 Unable to assess  3 Pain will decrease to 1/10 with all functional activities Baseline: pain to 4/10 with exercise and daily activities;07/14/21 pain up to 2/10 with step ups in clinc 07/14/2021 ongoing  4 Pt will return to walking 3 times a week for at least 3 miles Baseline: Pt has stopped walking due to fear of injuring knee; status: 07/14/21 has not returned to wlaking 07/14/2021 Ongoing    LONG TERM GOALS:    LTG Name Target Date Goal status  1 Pt will be independent with advanced HEP Baseline:no knowledge of progressive overload 08/04/2021 INITIAL  2 Pt will be able to demonstrate Goblet squats with 90/90 hip flex and knee while utilizing 25 # or more with symmetrical form Baseline: unable to squat more than 60 degrees knee flex without pitching trunk forward 08/04/2021 INITIAL  3 Pt will be able to deadlift  55 lb to show increased LE strength Baseline: 0 deadlift 08/04/2021 INITIAL  4 Pt will be able to negotiate stairs without exacerbating pain. Baseline: eval 4/10 pain 08/04/2021 INITIAL  5 FOTO will improve from 72%   to  79 %   indicating improved functional mobility Baseline:Eval 72% 08/04/2021 INITIAL  6 Pt will demonstrate floor to stand transfer to show evidence of reduced fall risk and to be able to participate in yoga and pilates pain free Baseline: Pt is hesitant to bend knee due to pain in flexion   08/04/2021 INITIAL    PLAN: PT FREQUENCY: 2x/week   PT DURATION: 6 weeks   PLANNED INTERVENTIONS: Therapeutic exercises, Therapeutic activity, Neuro Muscular re-education, Balance training, Gait training, Patient/Family education, Joint mobilization, Stair training, Dry Needling, Electrical stimulation, Cryotherapy, Moist heat, Taping, Ionotophoresis 4mg /ml Dexamethasone, and Manual therapy   PLAN FOR NEXT SESSION: , FOTO status lateral hip strength , step ups and step downs TKE,  work on deadlift and proper [place], PTA 07/14/21 11:27 AM Phone: [phone] Fax: [phone]

## 2021-07-18 NOTE — Therapy (Signed)
OUTPATIENT PHYSICAL THERAPY TREATMENT NOTE   Patient Name: Lisa Bauer MRN: [id number] DOB:04-Aug-1955, 66 y.o., female Today's Date: 07/19/2021  PCP: Wenda Low, MD REFERRING PROVIDER: Wenda Low, MD   PT End of Session - 07/19/21 1357     Visit Number 7    Number of Visits 12    Date for PT Re-Evaluation 08/04/21    Authorization Type [employer] Primary MCR  6th foto/ 10th progress note    PT Start Time 1104    PT Stop Time 1145    PT Time Calculation (min) 41 min    Activity Tolerance Patient tolerated treatment well    Behavior During Therapy [facility] for tasks assessed/performed               Past Medical History:  Diagnosis Date   Hypertension    Liver abscess 01/2018   bulb drain in place   Past Surgical History:  Procedure Laterality Date   IR RADIOLOGIST EVAL & MGMT  02/05/2018   IR THORACENTESIS ASP PLEURAL SPACE W/IMG GUIDE  02/06/2018   Patient Active Problem List   Diagnosis Date Noted   Hypertensive disorder 02/05/2018   Sciatica 02/05/2018   Uterine leiomyoma 02/05/2018   ARF (acute renal failure) ([place]) 01/11/2018   Severe sepsis with septic shock ([place]) 01/11/2018   Bacteremia due to Streptococcus 01/11/2018   Elevated liver function tests 01/11/2018   Anemia 01/11/2018   Hepatic abscess 01/09/2018   Bursitis of hip 08/09/2017    REFERRING DIAG: M25.569 (ICD-10-CM) - Knee pain  THERAPY DIAG:  Acute pain of right knee  Muscle weakness (generalized)  PERTINENT HISTORY: OA,Hepatic Abscess 2019 with ARF and sepsis, anemia , bursitis of hip, Sciatica  PRECAUTIONS: None  SUBJECTIVE:  I took a meloxicam yesterday for my knees were aching.  Today I havent done anything yet and I have no pain yet because I have not exercised.    PAIN:  Are you having pain? No NPRS scale: 0/10 at rest     Pain location: R knee Pain orientation: Anterior and Upper  pole of patella PAIN TYPE: sharp and stabbing at injury   now aching Pain description:  intermittent  Aggravating factors: going up stairs, wt bearing in and out of chairs Relieving factors: Ice  elevation. Iburprofen      OBJECTIVE:  All objective findings taken on eval unless otherwise noted   DIAGNOSTIC FINDINGS: None for knee   PATIENT SURVEYS:  FOTO eval 72% predicted 79%   COGNITION:          Overall cognitive status: Within functional limits for tasks assessed                        SENSATION:          Light touch: Appears intact          Stereognosis: Appears intact          Hot/Cold: Appears intact          Proprioception: Appears intact   MUSCLE LENGTH: Hamstrings: WNL Thomas test: WNL   POSTURE:  Slender  slight forward head   PALPATION: TTP -slight tenderness over superior pole of patella but generally no pain   LE AROM/PROM:   A/PROM Right 06/23/2021 Left 06/23/2021  Hip flexion 110 112  Hip extension      Hip abduction      Hip adduction      Hip internal rotation      Hip  external rotation      Knee flexion 140/P142 140 P144  Knee extension 0 0  Ankle dorsiflexion      Ankle plantarflexion      Ankle inversion      Ankle eversion       (Blank rows = not tested)   LE MMT:   MMT Right 06/23/2021 Left 06/23/2021 Right  07/11/21 Left 07/11/21  Hip flexion 4+/5 4+/5    Hip extension 4+/5 4+/5    Hip abduction 4-/5 4-/5 4-/5 4-/5  Hip adduction        Hip internal rotation        Hip external rotation        Knee flexion 4+/5 4+/5    Knee extension 4+/5 4+/5    Ankle dorsiflexion        Ankle plantarflexion 5/5 5/5    Ankle inversion        Ankle eversion         (Blank rows = not tested)   LOWER EXTREMITY SPECIAL TESTS:  Knee special tests: Anterior drawer test: negative, Thessaly test: negative, and Step up/down test: positive  Pain on R and knee valgus with step down.   FUNCTIONAL TESTS:  5 times sit to stand: 16.76 sec Squat- pt pitches forward and has reduced Hip hinge and inability  to squat at knee flexion 90 degrees  ( 60 degrees)   GAIT: Distance walked: 150 Assistive device utilized: None Level of assistance: Complete Independence Comments: Normal gait         TODAY'S TREATMENT:  [facility] Adult PT Treatment:                                                DATE: 07-19-21 Therapeutic Exercise: Teaching stretches 1: plank to downdog, Best overall stretch (lunge with thoracic rotataion) to Mini Emom   3 rounds     Goblet squat (regular seat 18 inch)20 # KB x 10 Step ups on 8 inch step 10 on R and 10 on L  ( can begin carrying KB starting at 15 lb  Single arm lat pul  BTB x 15 R and L Single leg bridge x 10 each off the edge of the mat for more ROM  Deadlift 45# 10 x 2    [facility] Adult PT Treatment:                                                DATE: 07/13/21 Manual Therapy: Mcconnell tape to bilater knees  Therapeutic Exercise: Side hip abduction circles -stir the pot 10 x 2 each 2# Glut med side hip abduction x 10 2#  Side plank with  clam green band 10 x 1 each Single leg bridge x 15 each Static Bridge with blue band clam x 10x2  Side stepping squat with blue band around knees -10 ft x 6 Goblet squat 15# with blue band at knees tap to mat x 15 reps - no pain Step ups 6inch with opp knee drive x 15 each- no pain on right, min pain left 2/10 4 inch lateral step downs x 15 each 2/10 pain right  Deadlift 45# 10 x 2 Childs pose to end session   [facility] Adult  PT Treatment:                                                DATE: 07/11/21 Therapeutic Exercise: Side hip abduction circles -stir the pot 10 x 2 each  Side clam green band 10 x 2 each Single leg bridge x 10 each Bridge with blue band clam x 15  Side stepping squat with blue band around knees  Goblet squat 15# with blue band at knees tap to mat x 15 reps  Step ups with blue band resistance from PTA -pt pulling lateral 10 x 2 each 3/10 pain Front plank 60 sec Side plank knees and elbow 30 sec , bilat  Deadlift 30# 10 x 2   [facility] Adult PT  Treatment:                                                DATE: 07/07/21 Therapeutic Exercise: Following exercise post Mc Connell taping Mini Emom  4 rounds     Goblet squat (regular seat 18 inch)15 # KB x 10 Single arm lat pul  BTB x 15 R and L Step ups on 8 inch step 10 on R and 10 on L  ( can begin carrying KB starting at 10 lb and progressing) Front plank  up to a 30 sec up to  minute  Side plank 30 sec L and 30 sec R VC for position Kickstand deadlift with 5 lb wt on L and R  Self Care:  Educated on how to do [employer] taping at home (technique and supplies) with return demo x 3. Pt did R knee and pt did L knee. McConnell taping med to lateral pull bil knees  pain decreased with squats/step  [facility] Adult PT Treatment:                                                DATE: 07-04-21 Therapeutic Exercise:  Elevated seat with there ex cushion  Goblet squat 10# KB 2x10  Manual: tape applied   Hip hinge training with wooden dowel for squat vs deadlift  Dead lift 10# x 10, Deadlift 25# 10 x 2   Step up 4 inch forward x 10 , lateral x 10- no pain with tape  Attempted 6 inch step up- increased pain with tape   Single Leg Heel Raise with Counter Support -1 sets - 30 reps  3 way hip while maintaining SLS on foam oval  Manual Therapy:  McConnell taping med to lateral pull bil knees  pain decreased with squats/step       PATIENT EDUCATION:  Education details: continue HEP- Person educated: Patient Education method: Explanation, Demonstration, Tactile cues, and Verbal cues Education comprehension: verbalized understanding, returned demonstration, and needs further education     HOME EXERCISE PROGRAM: Access Code: [email or web address] URL: [email or web address] Date: 06/30/2021 Prepared by: Voncille Lo  Program Notes  EMOM Goblet squat starts elevated with chair.  Goblet squat (regular seat 18 inch)15 # KB x 10 Single arm lat pul  BTB x 15 R and L Step ups on 8 inch  step 10  on R and 10 on L  ( can begin carrying KB starting at 10 lb and progressing) Front plank  up to a 30 sec up to  minute   Exercises Goblet Squat with Kettlebell - 1 x daily - 7 x weekly - 3 sets - 10 reps Supine Hip Flexion with Resistance Loop - 1 x daily - 7 x weekly - 3 sets - 10 reps Side Stepping with Resistance at Ankles - 1 x daily - 7 x weekly - 3 sets - 10 reps Single Leg Heel Raise with Counter Support - 1 x daily - 7 x weekly - 1 sets - 30 reps Hip Extension with Resistance Loop - 1 x daily - 7 x weekly - 3 sets - 10 reps Hip Abduction with Resistance Loop - 1 x daily - 7 x weekly - 3 sets - 10 reps    ASSESSMENT:   CLINICAL IMPRESSION: Pt arrives without pain and no Mc connell tape for knees today.  Pt taught full body stretches for warm up with lunge with thoracic twist followed by tall plank to downward dog.  Pt is building strength with progressive overload using EMOMS.  Pt was able to perform increased demand of weights and continuous exercise without adverse effect.  Pt is increasing strength and decreasing pain with increased tolerance to loading    REHAB POTENTIAL: Excellent   CLINICAL DECISION MAKING: Stable/uncomplicated   EVALUATION COMPLEXITY: Low     GOALS: Goals reviewed with patient? Yes   SHORT TERM GOALS:   STG Name Target Date Goal status  1 Pt will be independent with HEP Baseline: limited knowledge 07/14/2021 Achieved  2 Pt will be able to show reduced risk of fall with 5 x STS 13.0 sec or less Baseline:  eval 16.76 sec 07/14/2021 Unable to assess  3 Pain will decrease to 1/10 with all functional activities Baseline: pain to 4/10 with exercise and daily activities;07/14/21 pain up to 2/10 with step ups in clinc 07/14/2021 ongoing  4 Pt will return to walking 3 times a week for at least 3 miles Baseline: Pt has stopped walking due to fear of injuring knee; status: 07/14/21 has not returned to wlaking 07/14/2021 Ongoing    LONG TERM GOALS:    LTG Name Target  Date Goal status  1 Pt will be independent with advanced HEP Baseline:no knowledge of progressive overload 08/04/2021 ongoing  2 Pt will be able to demonstrate Goblet squats with 90/90 hip flex and knee while utilizing 25 # or more with symmetrical form Baseline: able to squat to chair with 20# 08/04/2021 ongoing  3 Pt will be able to deadlift  55 lb to show increased LE strength Baseline: 45 lb deadlift 08/04/2021 ongoing  4 Pt will be able to negotiate stairs without exacerbating pain. Baseline: eval 4/10 pain  minimal pain to 0/10 08/04/2021 ongoing  5 FOTO will improve from 72%   to  79 %  indicating improved functional mobility Baseline:Eval 72% 08/04/2021 ongoing  6 Pt will demonstrate floor to stand transfer to show evidence of reduced fall risk and to be able to participate in yoga and pilates pain free Baseline: able to lunge stretch and do tall plank to downward dog   08/04/2021 ongoing    PLAN: PT FREQUENCY: 2x/week   PT DURATION: 6 weeks   PLANNED INTERVENTIONS: Therapeutic exercises, Therapeutic activity, Neuro Muscular re-education, Balance training, Gait training, Patient/Family education, Joint mobilization, Stair training, Dry Needling, Electrical stimulation, Cryotherapy, Moist  heat, Taping, Ionotophoresis 4mg /ml Dexamethasone, and Manual therapy   PLAN FOR NEXT SESSION: , FOTO status lateral hip strength , step ups and step downs TKE,  work on deadlift and proper [place], PT, [place] Certified Exercise Expert for the Aging Adult  07/19/21 2:19 PM Phone: [phone] Fax: [phone]

## 2021-07-19 ENCOUNTER — Encounter: Payer: Self-pay | Admitting: Physical Therapy

## 2021-07-19 ENCOUNTER — Ambulatory Visit: Payer: Medicare Other | Admitting: Physical Therapy

## 2021-07-19 ENCOUNTER — Other Ambulatory Visit: Payer: Self-pay

## 2021-07-19 DIAGNOSIS — M25561 Pain in right knee: Secondary | ICD-10-CM

## 2021-07-19 DIAGNOSIS — M6281 Muscle weakness (generalized): Secondary | ICD-10-CM

## 2021-07-22 ENCOUNTER — Ambulatory Visit: Payer: Medicare Other | Admitting: Physical Therapy

## 2021-07-22 NOTE — Therapy (Signed)
OUTPATIENT PHYSICAL THERAPY TREATMENT NOTE   Patient Name: Lisa Bauer MRN: [id number] DOB:21-Jul-1955, 66 y.o., female Today's Date: 07/23/2021  PCP: Wenda Low, MD REFERRING PROVIDER: Wenda Low, MD   PT End of Session - 07/23/21 0817     Visit Number 8    Number of Visits 12    Date for PT Re-Evaluation 08/04/21    Authorization Type [employer] Primary MCR  6th foto/ 10th progress note    Progress Note Due on Visit 10    PT Start Time 0815    PT Stop Time 0900    PT Time Calculation (min) 45 min    Activity Tolerance Patient tolerated treatment well    Behavior During Therapy [facility] for tasks assessed/performed                Past Medical History:  Diagnosis Date   Hypertension    Liver abscess 01/2018   bulb drain in place   Past Surgical History:  Procedure Laterality Date   IR RADIOLOGIST EVAL & MGMT  02/05/2018   IR THORACENTESIS ASP PLEURAL SPACE W/IMG GUIDE  02/06/2018   Patient Active Problem List   Diagnosis Date Noted   Hypertensive disorder 02/05/2018   Sciatica 02/05/2018   Uterine leiomyoma 02/05/2018   ARF (acute renal failure) ([place]) 01/11/2018   Severe sepsis with septic shock ([place]) 01/11/2018   Bacteremia due to Streptococcus 01/11/2018   Elevated liver function tests 01/11/2018   Anemia 01/11/2018   Hepatic abscess 01/09/2018   Bursitis of hip 08/09/2017    REFERRING DIAG: M25.569 (ICD-10-CM) - Knee pain  THERAPY DIAG:  Acute pain of right knee  Muscle weakness (generalized)  PERTINENT HISTORY: OA,Hepatic Abscess 2019 with ARF and sepsis, anemia , bursitis of hip, Sciatica  PRECAUTIONS: None  SUBJECTIVE:  I'm feeling pretty good today, I had a little bit of pain the other day in my knee but it wasn't bad. I got to pilates 2x week and a strengthening class once a week.   PAIN:  Are you having pain? No NPRS scale: 0/10 currently 3/10 wrost in past 48 hours Pain location: R knee Pain orientation: Anterior and Upper  pole  of patella PAIN TYPE: sharp and stabbing at injury   now aching Pain description: intermittent  Aggravating factors: going up stairs, wt bearing in and out of chairs Relieving factors: Ice  elevation. Iburprofen      OBJECTIVE:  All objective findings taken on eval unless otherwise noted   DIAGNOSTIC FINDINGS: None for knee   PATIENT SURVEYS:  FOTO eval 72% predicted 79% 07/23/2021 73%   COGNITION:          Overall cognitive status: Within functional limits for tasks assessed                        SENSATION:          Light touch: Appears intact          Stereognosis: Appears intact          Hot/Cold: Appears intact          Proprioception: Appears intact   MUSCLE LENGTH: Hamstrings: WNL Thomas test: WNL   POSTURE:  Slender  slight forward head   PALPATION: TTP -slight tenderness over superior pole of patella but generally no pain   LE AROM/PROM:   A/PROM Right 06/23/2021 Left 06/23/2021  Hip flexion 110 112  Hip extension      Hip abduction  Hip adduction      Hip internal rotation      Hip external rotation      Knee flexion 140/P142 140 P144  Knee extension 0 0  Ankle dorsiflexion      Ankle plantarflexion      Ankle inversion      Ankle eversion       (Blank rows = not tested)   LE MMT:   MMT Right 06/23/2021 Left 06/23/2021 Right  07/11/21 Left 07/11/21  Hip flexion 4+/5 4+/5    Hip extension 4+/5 4+/5    Hip abduction 4-/5 4-/5 4-/5 4-/5  Hip adduction        Hip internal rotation        Hip external rotation        Knee flexion 4+/5 4+/5    Knee extension 4+/5 4+/5    Ankle dorsiflexion        Ankle plantarflexion 5/5 5/5    Ankle inversion        Ankle eversion         (Blank rows = not tested)   LOWER EXTREMITY SPECIAL TESTS:  Knee special tests: Anterior drawer test: negative, Thessaly test: negative, and Step up/down test: positive  Pain on R and knee valgus with step down.   FUNCTIONAL TESTS:  5 times sit to stand: 16.76  sec Squat- pt pitches forward and has reduced Hip hinge and inability  to squat at knee flexion 90 degrees ( 60 degrees) 07/23/2021: 5xSTS 8.8 seconds   GAIT: Distance walked: 150 Assistive device utilized: None Level of assistance: Complete Independence Comments: Normal gait         TODAY'S TREATMENT:  [facility] Adult PT Treatment:                                                DATE: 07/22/2021 Therapeutic Exercise: Goblet squat 20# KB x 10 to regular 18" seat Step ups 8" step holding 15# KB x 10 BIL Standing hip abduction 17.5#  x 10 BIL Standing hip extension 17.5# x 10 BIL Sidelying hip abduction circles -stir the pot 10 x 2 each 2# CW/CCW Single leg bridge x 10 Side stepping squat with BlueTB around knees 2 x 30' Childs pose x 30" Front plank on elbows 2 x 30" Heel taps from 4" step x 10 BIL   [facility] Adult PT Treatment:                                                DATE: 07-19-21 Therapeutic Exercise: Teaching stretches 1: plank to downdog, Best overall stretch (lunge with thoracic rotataion) to Mini Emom   3 rounds     Goblet squat (regular seat 18 inch)20 # KB x 10 Step ups on 8 inch step 10 on R and 10 on L  ( can begin carrying KB starting at 15 lb  Single arm lat pul  BTB x 15 R and L Single leg bridge x 10 each off the edge of the mat for more ROM  Deadlift 45# 10 x 2    [facility] Adult PT Treatment:  DATE: 07/13/21 Manual Therapy: Mcconnell tape to bilater knees  Therapeutic Exercise: Side hip abduction circles -stir the pot 10 x 2 each 2# Glut med side hip abduction x 10 2#  Side plank with  clam green band 10 x 1 each Single leg bridge x 15 each Static Bridge with blue band clam x 10x2  Side stepping squat with blue band around knees -10 ft x 6 Goblet squat 15# with blue band at knees tap to mat x 15 reps - no pain Step ups 6inch with opp knee drive x 15 each- no pain on right, min pain left 2/10 4 inch lateral step  downs x 15 each 2/10 pain right  Deadlift 45# 10 x 2 Childs pose to end session       PATIENT EDUCATION:  Education details: continue HEP- Person educated: Patient Education method: Consulting civil engineer, Media planner, Corporate treasurer cues, and Verbal cues Education comprehension: verbalized understanding, returned demonstration, and needs further education     HOME EXERCISE PROGRAM: Access Code: [email or web address] URL: [email or web address] Date: 06/30/2021 Prepared by: Voncille Lo  Program Notes  EMOM Goblet squat starts elevated with chair.  Goblet squat (regular seat 18 inch)15 # KB x 10 Single arm lat pul  BTB x 15 R and L Step ups on 8 inch step 10 on R and 10 on L  ( can begin carrying KB starting at 10 lb and progressing) Front plank  up to a 30 sec up to  minute   Exercises Goblet Squat with Kettlebell - 1 x daily - 7 x weekly - 3 sets - 10 reps Supine Hip Flexion with Resistance Loop - 1 x daily - 7 x weekly - 3 sets - 10 reps Side Stepping with Resistance at Ankles - 1 x daily - 7 x weekly - 3 sets - 10 reps Single Leg Heel Raise with Counter Support - 1 x daily - 7 x weekly - 1 sets - 30 reps Hip Extension with Resistance Loop - 1 x daily - 7 x weekly - 3 sets - 10 reps Hip Abduction with Resistance Loop - 1 x daily - 7 x weekly - 3 sets - 10 reps    ASSESSMENT:   CLINICAL IMPRESSION: Patient presents to PT without pain in BIL knees. She remains enthusiastic and demonstrates good participation throughout session. She reported mild pain with lateral squat steps in the R knee. She met her STS goal today. Patient continues to benefit from skilled PT services and should be progressed as able to improve functional independence.    REHAB POTENTIAL: Excellent   CLINICAL DECISION MAKING: Stable/uncomplicated   EVALUATION COMPLEXITY: Low     GOALS: Goals reviewed with patient? Yes   SHORT TERM GOALS:   STG Name Target Date Goal status  1 Pt will be independent with  HEP Baseline: limited knowledge 07/14/2021 Achieved  2 Pt will be able to show reduced risk of fall with 5 x STS 13.0 sec or less Baseline:  eval 16.76 sec Current 07/23/2021 8.8 seconds 07/14/2021 MET  3 Pain will decrease to 1/10 with all functional activities Baseline: pain to 4/10 with exercise and daily activities;07/14/21 pain up to 2/10 with step ups in clinc 07/14/2021 ongoing  4 Pt will return to walking 3 times a week for at least 3 miles Baseline: Pt has stopped walking due to fear of injuring knee; status: 07/14/21 has not returned to wlaking 07/14/2021 Ongoing    LONG TERM GOALS:    LTG Name Target Date Goal  status  1 Pt will be independent with advanced HEP Baseline:no knowledge of progressive overload 08/04/2021 ongoing  2 Pt will be able to demonstrate Goblet squats with 90/90 hip flex and knee while utilizing 25 # or more with symmetrical form Baseline: able to squat to chair with 20# 08/04/2021 ongoing  3 Pt will be able to deadlift  55 lb to show increased LE strength Baseline: 45 lb deadlift 08/04/2021 ongoing  4 Pt will be able to negotiate stairs without exacerbating pain. Baseline: eval 4/10 pain  minimal pain to 0/10 08/04/2021 ongoing  5 FOTO will improve from 72%   to  79 %  indicating improved functional mobility Baseline:Eval 72% 07/23/2021: 73% 08/04/2021 ongoing  6 Pt will demonstrate floor to stand transfer to show evidence of reduced fall risk and to be able to participate in yoga and pilates pain free Baseline: able to lunge stretch and do tall plank to downward dog   08/04/2021 ongoing    PLAN: PT FREQUENCY: 2x/week   PT DURATION: 6 weeks   PLANNED INTERVENTIONS: Therapeutic exercises, Therapeutic activity, Neuro Muscular re-education, Balance training, Gait training, Patient/Family education, Joint mobilization, Stair training, Dry Needling, Electrical stimulation, Cryotherapy, Moist heat, Taping, Ionotophoresis 2m/ml Dexamethasone, and Manual therapy   PLAN FOR NEXT  SESSION: FOTO status lateral hip strength , step ups and step downs TKE,  work on deadlift and proper squat  CSullivan PTA 07/23/21 9:35 AM

## 2021-07-23 ENCOUNTER — Other Ambulatory Visit: Payer: Self-pay

## 2021-07-23 ENCOUNTER — Ambulatory Visit: Payer: Medicare Other

## 2021-07-23 DIAGNOSIS — M25561 Pain in right knee: Secondary | ICD-10-CM

## 2021-07-23 DIAGNOSIS — M6281 Muscle weakness (generalized): Secondary | ICD-10-CM

## 2021-07-25 ENCOUNTER — Ambulatory Visit: Payer: Medicare Other | Admitting: Physical Therapy

## 2021-07-27 ENCOUNTER — Ambulatory Visit: Payer: Medicare Other | Admitting: Physical Therapy

## 2021-07-27 ENCOUNTER — Encounter: Payer: Self-pay | Admitting: Physical Therapy

## 2021-07-27 ENCOUNTER — Other Ambulatory Visit: Payer: Self-pay

## 2021-07-27 DIAGNOSIS — M25561 Pain in right knee: Secondary | ICD-10-CM | POA: Diagnosis not present

## 2021-07-27 DIAGNOSIS — M6281 Muscle weakness (generalized): Secondary | ICD-10-CM

## 2021-07-27 NOTE — Therapy (Signed)
OUTPATIENT PHYSICAL THERAPY TREATMENT NOTE   Patient Name: Lisa Bauer MRN: [id number] DOB:06-May-1956, 66 y.o., female Today's Date: 07/27/2021  PCP: Wenda Low, MD REFERRING PROVIDER: Wenda Low, MD   PT End of Session - 07/27/21 0933     Visit Number 9    Number of Visits 12    Date for PT Re-Evaluation 08/04/21    Authorization Type [employer] Primary MCR  6th foto/ 10th progress note    Progress Note Due on Visit 10    PT Start Time 0932    PT Stop Time 1010    PT Time Calculation (min) 38 min                Past Medical History:  Diagnosis Date   Hypertension    Liver abscess 01/2018   bulb drain in place   Past Surgical History:  Procedure Laterality Date   IR RADIOLOGIST EVAL & MGMT  02/05/2018   IR THORACENTESIS ASP PLEURAL SPACE W/IMG GUIDE  02/06/2018   Patient Active Problem List   Diagnosis Date Noted   Hypertensive disorder 02/05/2018   Sciatica 02/05/2018   Uterine leiomyoma 02/05/2018   ARF (acute renal failure) ([place]) 01/11/2018   Severe sepsis with septic shock ([place]) 01/11/2018   Bacteremia due to Streptococcus 01/11/2018   Elevated liver function tests 01/11/2018   Anemia 01/11/2018   Hepatic abscess 01/09/2018   Bursitis of hip 08/09/2017    REFERRING DIAG: M25.569 (ICD-10-CM) - Knee pain  THERAPY DIAG:  Acute pain of right knee  Muscle weakness (generalized)  PERTINENT HISTORY: OA,Hepatic Abscess 2019 with ARF and sepsis, anemia , bursitis of hip, Sciatica  PRECAUTIONS: None  SUBJECTIVE:  The knees are much better. Pain is 2 or less on stairs, otherwise no knee pain.    PAIN:  Are you having pain? No NPRS scale: 0/10 currently 2/10 wrost in past 48 hours Pain location: R knee Pain orientation: Anterior and Upper  pole of patella PAIN TYPE: sharp and stabbing at injury   now aching Pain description: intermittent  Aggravating factors: going up stairs, wt bearing in and out of chairs Relieving factors: Ice   elevation. Iburprofen      OBJECTIVE:  All objective findings taken on eval unless otherwise noted   DIAGNOSTIC FINDINGS: None for knee   PATIENT SURVEYS:  FOTO eval 75% predicted 79% 07/23/2021 73%   COGNITION:          Overall cognitive status: Within functional limits for tasks assessed                        SENSATION:          Light touch: Appears intact          Stereognosis: Appears intact          Hot/Cold: Appears intact          Proprioception: Appears intact   MUSCLE LENGTH: Hamstrings: WNL Thomas test: WNL   POSTURE:  Slender  slight forward head   PALPATION: TTP -slight tenderness over superior pole of patella but generally no pain   LE AROM/PROM:   A/PROM Right 06/23/2021 Left 06/23/2021  Hip flexion 110 112  Hip extension      Hip abduction      Hip adduction      Hip internal rotation      Hip external rotation      Knee flexion 140/P142 140 P144  Knee extension 0 0  Ankle dorsiflexion      Ankle plantarflexion      Ankle inversion      Ankle eversion       (Blank rows = not tested)   LE MMT:   MMT Right 06/23/2021 Left 06/23/2021 Right  07/11/21 Left 07/11/21  Hip flexion 4+/5 4+/5    Hip extension 4+/5 4+/5    Hip abduction 4-/5 4-/5 4-/5 4-/5  Hip adduction        Hip internal rotation        Hip external rotation        Knee flexion 4+/5 4+/5    Knee extension 4+/5 4+/5    Ankle dorsiflexion        Ankle plantarflexion 5/5 5/5    Ankle inversion        Ankle eversion         (Blank rows = not tested)   LOWER EXTREMITY SPECIAL TESTS:  Knee special tests: Anterior drawer test: negative, Thessaly test: negative, and Step up/down test: positive  Pain on R and knee valgus with step down.   FUNCTIONAL TESTS:  5 times sit to stand: 16.76 sec Squat- pt pitches forward and has reduced Hip hinge and inability  to squat at knee flexion 90 degrees ( 60 degrees) 07/23/2021: 5xSTS 8.8 seconds   GAIT: Distance walked: 150 Assistive device  utilized: None Level of assistance: Complete Independence Comments: Normal gait         TODAY'S TREATMENT:  [facility] Adult PT Treatment:                                                DATE: 07/27/2021 Therapeutic Exercise: Goblet squat 20# KB x 10 to regular 18" seat=-attempted 16 inch -unable to do with 25#   Standing hip abduction 17.5#  2 x 10 BIL Standing hip extension 17.5# 2 x 10 BIL Step ups 8" step holding 15# KB x 15 BIL 4 inch step  down x10 each 6 inch step down x 10 each  Side stepping squat with BlueTB around knees 2 x 45"    [facility] Adult PT Treatment:                                                DATE: 07/22/2021 Therapeutic Exercise: Goblet squat 20# KB x 10 to regular 18" seat Step ups 8" step holding 15# KB x 10 BIL Heel taps from 4" step x 10 BIL Standing hip abduction 17.5#  x 10 BIL Standing hip extension 17.5# x 10 BIL Sidelying hip abduction circles -stir the pot 10 x 2 each 2# CW/CCW Single leg bridge x 10 Side stepping squat with BlueTB around knees 2 x 30' Childs pose x 30" Front plank on elbows 2 x 30"    [facility] Adult PT Treatment:                                                DATE: 07-19-21 Therapeutic Exercise: Teaching stretches 1: plank to downdog, Best overall stretch (lunge with thoracic rotataion) to Mini Emom   3 rounds  Goblet squat (regular seat 18 inch)20 # KB x 10 Step ups on 8 inch step 10 on R and 10 on L  ( can begin carrying KB starting at 15 lb  Single arm lat pul  BTB x 15 R and L Single leg bridge x 10 each off the edge of the mat for more ROM  Deadlift 45# 10 x 2    [facility] Adult PT Treatment:                                                DATE: 07/13/21 Manual Therapy: Mcconnell tape to bilater knees  Therapeutic Exercise: Side hip abduction circles -stir the pot 10 x 2 each 2# Glut med side hip abduction x 10 2#  Side plank with  clam green band 10 x 1 each Single leg bridge x 15 each Static Bridge with blue band clam x  10x2  Side stepping squat with blue band around knees -10 ft x 6 Goblet squat 15# with blue band at knees tap to mat x 15 reps - no pain Step ups 6inch with opp knee drive x 15 each- no pain on right, min pain left 2/10 4 inch lateral step downs x 15 each 2/10 pain right  Deadlift 45# 10 x 2 Childs pose to end session       PATIENT EDUCATION:  Education details: continue HEP-FOTO results Person educated: Patient Education method: Explanation, Demonstration, Tactile cues, and Verbal cues Education comprehension: verbalized understanding, returned demonstration, and needs further education     HOME EXERCISE PROGRAM: Access Code: [email or web address] URL: [email or web address] Date: 06/30/2021 Prepared by: Voncille Lo  Program Notes  EMOM Goblet squat starts elevated with chair.  Goblet squat (regular seat 18 inch)15 # KB x 10 Single arm lat pul  BTB x 15 R and L Step ups on 8 inch step 10 on R and 10 on L  ( can begin carrying KB starting at 10 lb and progressing) Front plank  up to a 30 sec up to  minute   Exercises Goblet Squat with Kettlebell - 1 x daily - 7 x weekly - 3 sets - 10 reps Supine Hip Flexion with Resistance Loop - 1 x daily - 7 x weekly - 3 sets - 10 reps Side Stepping with Resistance at Ankles - 1 x daily - 7 x weekly - 3 sets - 10 reps Single Leg Heel Raise with Counter Support - 1 x daily - 7 x weekly - 1 sets - 30 reps Hip Extension with Resistance Loop - 1 x daily - 7 x weekly - 3 sets - 10 reps Hip Abduction with Resistance Loop - 1 x daily - 7 x weekly - 3 sets - 10 reps    ASSESSMENT:   CLINICAL IMPRESSION: Patient presents to PT without pain in BIL knees. Able to complete all therex today without increased knee pain. Able to perform 6 inch step down without pan. She can perform 90/90 squats , without weight. Will need to work on this to meet squat goal. Patient continues to benefit from skilled PT services and should be progressed as able to  improve functional independence.    REHAB POTENTIAL: Excellent   CLINICAL DECISION MAKING: Stable/uncomplicated   EVALUATION COMPLEXITY: Low     GOALS: Goals reviewed with patient? Yes   SHORT TERM GOALS:  STG Name Target Date Goal status  1 Pt will be independent with HEP Baseline: limited knowledge 07/14/2021 Achieved  2 Pt will be able to show reduced risk of fall with 5 x STS 13.0 sec or less Baseline:  eval 16.76 sec Current 07/23/2021 8.8 seconds 07/14/2021 MET  3 Pain will decrease to 1/10 with all functional activities Baseline: pain to 4/10 with exercise and daily activities;07/14/21 pain up to 2/10 with step ups in clinc Status: 07/27/21: 1-2/10 pain with steps  07/14/2021 Partially met  MET  4 Pt will return to walking 3 times a week for at least 3 miles Baseline: Pt has stopped walking due to fear of injuring knee; status: 07/14/21 has not returned to walking 07/27/21: plans to return to walking soon 07/14/2021 Ongoing    LONG TERM GOALS:    LTG Name Target Date Goal status  1 Pt will be independent with advanced HEP Baseline:no knowledge of progressive overload 08/04/2021 ongoing  2 Pt will be able to demonstrate Goblet squats with 90/90 hip flex and knee while utilizing 25 # or more with symmetrical form Baseline: able to squat to chair with 20# 08/04/2021 ongoing  3 Pt will be able to deadlift  55 lb to show increased LE strength Baseline: 45 lb deadlift 08/04/2021 ongoing  4 Pt will be able to negotiate stairs without exacerbating pain. Baseline: eval 4/10 pain  minimal pain to 0/10 Status 07/27/21: 1-2/10 pain with stairs 08/04/2021 ongoing  5 FOTO will improve from 72%   to  79 %  indicating improved functional mobility Baseline:Eval 72% 07/23/2021: 73% 08/04/2021 ongoing  6 Pt will demonstrate floor to stand transfer to show evidence of reduced fall risk and to be able to participate in yoga and pilates pain free Baseline: able to lunge stretch and do tall plank to downward dog    08/04/2021 ongoing    PLAN: PT FREQUENCY: 2x/week   PT DURATION: 6 weeks   PLANNED INTERVENTIONS: Therapeutic exercises, Therapeutic activity, Neuro Muscular re-education, Balance training, Gait training, Patient/Family education, Joint mobilization, Stair training, Dry Needling, Electrical stimulation, Cryotherapy, Moist heat, Taping, Ionotophoresis 66m/ml Dexamethasone, and Manual therapy   PLAN FOR NEXT SESSION: 90/90 squat with weight, deadlift 55# , goals, recheck hip abduction  JHessie Diener PTA 07/27/21 12:09 PM Phone: [phone]: [phone]

## 2021-07-29 ENCOUNTER — Ambulatory Visit: Payer: Medicare Other | Admitting: Physical Therapy

## 2021-07-29 ENCOUNTER — Other Ambulatory Visit: Payer: Self-pay

## 2021-07-29 ENCOUNTER — Encounter: Payer: Self-pay | Admitting: Physical Therapy

## 2021-07-29 DIAGNOSIS — M25561 Pain in right knee: Secondary | ICD-10-CM | POA: Diagnosis not present

## 2021-07-29 DIAGNOSIS — M6281 Muscle weakness (generalized): Secondary | ICD-10-CM

## 2021-07-29 NOTE — Therapy (Signed)
OUTPATIENT PHYSICAL THERAPY TREATMENT NOTE   Patient Name: Lisa Bauer MRN: [id number] DOB:Apr 30, 1956, 66 y.o., female Today's Date: 07/29/2021  PCP: Wenda Low, MD REFERRING PROVIDER: Wenda Low, MD   PT End of Session - 07/29/21 0803     Visit Number 10    Number of Visits 12    Date for PT Re-Evaluation 08/04/21    Authorization Type [employer] Primary MCR  6th foto/ 10th progress note    Progress Note Due on Visit 10    PT Start Time 0800    PT Stop Time 0843    PT Time Calculation (min) 43 min                Past Medical History:  Diagnosis Date   Hypertension    Liver abscess 01/2018   bulb drain in place   Past Surgical History:  Procedure Laterality Date   IR RADIOLOGIST EVAL & MGMT  02/05/2018   IR THORACENTESIS ASP PLEURAL SPACE W/IMG GUIDE  02/06/2018   Patient Active Problem List   Diagnosis Date Noted   Hypertensive disorder 02/05/2018   Sciatica 02/05/2018   Uterine leiomyoma 02/05/2018   ARF (acute renal failure) ([place]) 01/11/2018   Severe sepsis with septic shock ([place]) 01/11/2018   Bacteremia due to Streptococcus 01/11/2018   Elevated liver function tests 01/11/2018   Anemia 01/11/2018   Hepatic abscess 01/09/2018   Bursitis of hip 08/09/2017    REFERRING DIAG: M25.569 (ICD-10-CM) - Knee pain  THERAPY DIAG:  Acute pain of right knee  Muscle weakness (generalized)  PERTINENT HISTORY: OA,Hepatic Abscess 2019 with ARF and sepsis, anemia , bursitis of hip, Sciatica  PRECAUTIONS: None  SUBJECTIVE:  The knees are much better. Pain is 2 or less on stairs, otherwise no knee pain.    PAIN:  Are you having pain? No NPRS scale: 0/10 currently 2/10 wrost in past 48 hours Pain location: R knee Pain orientation: Anterior and Upper  pole of patella PAIN TYPE: sharp and stabbing at injury   now aching Pain description: intermittent  Aggravating factors: going up stairs, wt bearing in and out of chairs Relieving factors: Ice   elevation. Iburprofen      OBJECTIVE:  All objective findings taken on eval unless otherwise noted   DIAGNOSTIC FINDINGS: None for knee   PATIENT SURVEYS:  FOTO eval 75% predicted 79% 07/23/2021 73%   COGNITION:          Overall cognitive status: Within functional limits for tasks assessed                        SENSATION:          Light touch: Appears intact          Stereognosis: Appears intact          Hot/Cold: Appears intact          Proprioception: Appears intact   MUSCLE LENGTH: Hamstrings: WNL Thomas test: WNL   POSTURE:  Slender  slight forward head   PALPATION: TTP -slight tenderness over superior pole of patella but generally no pain   LE AROM/PROM:   A/PROM Right 06/23/2021 Left 06/23/2021  Hip flexion 110 112  Hip extension      Hip abduction      Hip adduction      Hip internal rotation      Hip external rotation      Knee flexion 140/P142 140 P144  Knee extension 0 0  Ankle dorsiflexion      Ankle plantarflexion      Ankle inversion      Ankle eversion       (Blank rows = not tested)   LE MMT:   MMT Right 06/23/2021 Left 06/23/2021 Right  07/11/21 Left 07/11/21 Right 07/29/21 Left 07/29/21  Hip flexion 4+/5 4+/5      Hip extension 4+/5 4+/5      Hip abduction 4-/5 4-/5 4-/5 4-/5 4/5 4/5  Hip adduction          Hip internal rotation          Hip external rotation          Knee flexion 4+/5 4+/5      Knee extension 4+/5 4+/5      Ankle dorsiflexion          Ankle plantarflexion 5/5 5/5      Ankle inversion          Ankle eversion           (Blank rows = not tested)   LOWER EXTREMITY SPECIAL TESTS:  Knee special tests: Anterior drawer test: negative, Thessaly test: negative, and Step up/down test: positive  Pain on R and knee valgus with step down.   FUNCTIONAL TESTS:  5 times sit to stand: 16.76 sec Squat- pt pitches forward and has reduced Hip hinge and inability  to squat at knee flexion 90 degrees ( 60 degrees) 07/23/2021: 5xSTS 8.8  seconds   GAIT: Distance walked: 150 Assistive device utilized: None Level of assistance: Complete Independence Comments: Normal gait         TODAY'S TREATMENT:  [facility] Adult PT Treatment:                                                DATE: 07/29/2021 Therapeutic Exercise: MMT 4/5 hip abduction Standing 2 way hip abdct/ext red band with SLS  Stanidng hip abdct with SLS on foam red band x 10 each -light touch  90/90 Goblet squat 25# KB x 10 x2 Step ups 8" step holding 15# KB x 15 BIL 4 inch step  down x10 each Side lying glut med hip abduction 3# 10 x 3 each    [facility] Adult PT Treatment:                                                DATE: 07/27/2021 Therapeutic Exercise: Goblet squat 20# KB x 10 to regular 18" seat=-attempted 16 inch -unable to do with 25#   Standing hip abduction 17.5#  2 x 10 BIL Standing hip extension 17.5# 2 x 10 BIL Step ups 8" step holding 15# KB x 15 BIL 4 inch step  down x10 each 6 inch step down x 10 each  Side stepping squat with BlueTB around knees 2 x 45"    [facility] Adult PT Treatment:                                                DATE: 07/22/2021 Therapeutic Exercise: Goblet squat 20# KB x 10 to regular 18" seat  Step ups 8" step holding 15# KB x 10 BIL Heel taps from 4" step x 10 BIL Standing hip abduction 17.5#  x 10 BIL Standing hip extension 17.5# x 10 BIL Sidelying hip abduction circles -stir the pot 10 x 2 each 2# CW/CCW Single leg bridge x 10 Side stepping squat with BlueTB around knees 2 x 30' Childs pose x 30" Front plank on elbows 2 x 30"    [facility] Adult PT Treatment:                                                DATE: 07-19-21 Therapeutic Exercise: Teaching stretches 1: plank to downdog, Best overall stretch (lunge with thoracic rotataion) to Mini Emom   3 rounds     Goblet squat (regular seat 18 inch)20 # KB x 10 Step ups on 8 inch step 10 on R and 10 on L  ( can begin carrying KB starting at 15 lb  Single arm lat pul  BTB x  15 R and L Single leg bridge x 10 each off the edge of the mat for more ROM  Deadlift 45# 10 x 2    [facility] Adult PT Treatment:                                                DATE: 07/13/21 Manual Therapy: Mcconnell tape to bilater knees  Therapeutic Exercise: Side hip abduction circles -stir the pot 10 x 2 each 2# Glut med side hip abduction x 10 2#  Side plank with  clam green band 10 x 1 each Single leg bridge x 15 each Static Bridge with blue band clam x 10x2  Side stepping squat with blue band around knees -10 ft x 6 Goblet squat 15# with blue band at knees tap to mat x 15 reps - no pain Step ups 6inch with opp knee drive x 15 each- no pain on right, min pain left 2/10 4 inch lateral step downs x 15 each 2/10 pain right  Deadlift 45# 10 x 2 Childs pose to end session       PATIENT EDUCATION:  Education details: continue HEP Person educated: Patient Education method: Consulting civil engineer, Media planner, Corporate treasurer cues, and Verbal cues Education comprehension: verbalized understanding, returned demonstration, and needs further education     HOME EXERCISE PROGRAM: Access Code: [email or web address] URL: [email or web address] Date: 06/30/2021 Prepared by: Voncille Lo  Program Notes  EMOM Goblet squat starts elevated with chair.  Goblet squat (regular seat 18 inch)15 # KB x 10 Single arm lat pul  BTB x 15 R and L Step ups on 8 inch step 10 on R and 10 on L  ( can begin carrying KB starting at 10 lb and progressing) Front plank  up to a 30 sec up to  minute   Exercises Goblet Squat with Kettlebell - 1 x daily - 7 x weekly - 3 sets - 10 reps Supine Hip Flexion with Resistance Loop - 1 x daily - 7 x weekly - 3 sets - 10 reps Side Stepping with Resistance at Ankles - 1 x daily - 7 x weekly - 3 sets - 10 reps Single Leg Heel Raise with Counter Support - 1 x daily - 7  x weekly - 1 sets - 30 reps Hip Extension with Resistance Loop - 1 x daily - 7 x weekly - 3 sets - 10 reps Hip  Abduction with Resistance Loop - 1 x daily - 7 x weekly - 3 sets - 10 reps    ASSESSMENT:   CLINICAL IMPRESSION: Patient presents to PT without pain in BIL knees. Able to complete all therex today without increased knee pain.  She can perform 90/90 squats with 25# KB and no increased pain. Also able to deadlift 55# without increased pain and good form. She can complete floor to stand transfer without difficulty. LTG# 2,3,6 met today.  Patient continues to benefit from skilled PT services and next session will focus on finalizing HEP and discharge.     REHAB POTENTIAL: Excellent   CLINICAL DECISION MAKING: Stable/uncomplicated   EVALUATION COMPLEXITY: Low     GOALS: Goals reviewed with patient? Yes   SHORT TERM GOALS:   STG Name Target Date Goal status  1 Pt will be independent with HEP Baseline: limited knowledge 07/14/2021 Achieved  2 Pt will be able to show reduced risk of fall with 5 x STS 13.0 sec or less Baseline:  eval 16.76 sec Current 07/23/2021 8.8 seconds 07/14/2021 MET  3 Pain will decrease to 1/10 with all functional activities Baseline: pain to 4/10 with exercise and daily activities;07/14/21 pain up to 2/10 with step ups in clinc Status: 07/27/21: 1-2/10 pain with steps  07/14/2021 Partially met  MET  4 Pt will return to walking 3 times a week for at least 3 miles Baseline: Pt has stopped walking due to fear of injuring knee; status: 07/14/21 has not returned to walking 07/27/21: plans to return to walking soon 07/14/2021 Ongoing    LONG TERM GOALS:    LTG Name Target Date Goal status  1 Pt will be independent with advanced HEP Baseline:no knowledge of progressive overload 08/04/2021 ongoing  2 Pt will be able to demonstrate Goblet squats with 90/90 hip flex and knee while utilizing 25 # or more with symmetrical form Baseline: able to squat to chair with 20# 08/04/2021 Met 07/29/21  3 Pt will be able to deadlift  55 lb to show increased LE strength Baseline: 45 lb deadlift  08/04/2021 Met 07/29/21  4 Pt will be able to negotiate stairs without exacerbating pain. Baseline: eval 4/10 pain  minimal pain to 0/10 Status 07/27/21: 1-2/10 pain with stairs 08/04/2021 ongoing  5 FOTO will improve from 72%   to  79 %  indicating improved functional mobility Baseline:Eval 72% 07/23/2021: 73% 08/04/2021 ongoing  6 Pt will demonstrate floor to stand transfer to show evidence of reduced fall risk and to be able to participate in yoga and pilates pain free Baseline: able to lunge stretch and do tall plank to downward dog   08/04/2021 Met 07/29/21    PLAN: PT FREQUENCY: 2x/week   PT DURATION: 6 weeks   PLANNED INTERVENTIONS: Therapeutic exercises, Therapeutic activity, Neuro Muscular re-education, Balance training, Gait training, Patient/Family education, Joint mobilization, Stair training, Dry Needling, Electrical stimulation, Cryotherapy, Moist heat, Taping, Ionotophoresis 47m/ml Dexamethasone, and Manual therapy   PLAN FOR NEXT SESSION: streamline HEP, hip abduction strength, FOTO, goal DC next visit.   JHessie Diener PTA 07/29/21 8:54 AM Phone: [phone]: [phone]

## 2021-08-01 ENCOUNTER — Other Ambulatory Visit: Payer: Self-pay

## 2021-08-01 ENCOUNTER — Ambulatory Visit: Payer: Medicare Other | Admitting: Physical Therapy

## 2021-08-01 ENCOUNTER — Encounter: Payer: Self-pay | Admitting: Physical Therapy

## 2021-08-01 DIAGNOSIS — M25561 Pain in right knee: Secondary | ICD-10-CM | POA: Diagnosis not present

## 2021-08-01 DIAGNOSIS — M6281 Muscle weakness (generalized): Secondary | ICD-10-CM

## 2021-08-01 NOTE — Therapy (Addendum)
OUTPATIENT PHYSICAL THERAPY TREATMENT NOTE/DISCHARGE NOTE    PHYSICAL THERAPY DISCHARGE SUMMARY  Visits from Start of Care: 11  Current functional level related to goals / functional outcomes: AS indicated below. Pt reports improved 75%   Remaining deficits: Can have 3/10 pain when carrying groceries  up and down steps several times but mostly    Education / Equipment: HEP   Patient agrees to discharge. Patient goals were met. Patient is being discharged due to meeting the stated rehab goals.   Patient Name: Lisa Bauer MRN: [id number] DOB:[birth year], 66 y.o., female Today's Date: 08/01/2021  PCP: Wenda Low, MD REFERRING PROVIDER: Wenda Low, MD   PT End of Session - 08/01/21 [phone]     Visit Number 11    Number of Visits 12    Date for PT Re-Evaluation 08/04/21    Authorization Type [employer] Primary MCR  6th foto/ 10th progress note    Progress Note Due on Visit 10    PT Start Time 0935    PT Stop Time 1013    PT Time Calculation (min) 38 min                Past Medical History:  Diagnosis Date   Hypertension    Liver abscess 01/2018   bulb drain in place   Past Surgical History:  Procedure Laterality Date   IR RADIOLOGIST EVAL & MGMT  02/05/2018   IR THORACENTESIS ASP PLEURAL SPACE W/IMG GUIDE  02/06/2018   Patient Active Problem List   Diagnosis Date Noted   Hypertensive disorder 02/05/2018   Sciatica 02/05/2018   Uterine leiomyoma 02/05/2018   ARF (acute renal failure) (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 Pain is 2 or less on stairs, otherwise no knee pain.    PAIN:  Are you having pain? No NPRS scale: 0/10 currently 3/10 wrost in past 48 hours Pain location: R knee Pain orientation: Anterior and Upper  pole of patella PAIN TYPE: sharp and stabbing at injury   now aching Pain description: intermittent  Aggravating factors: going up stairs, wt bearing in and out of chairs Relieving factors: Ice  elevation. Iburprofen      OBJECTIVE:  All objective findings taken on eval unless otherwise noted   DIAGNOSTIC FINDINGS: None for knee   PATIENT SURVEYS:  FOTO eval 75% predicted 79% 07/23/2021 73% 08/01/21: 85%   COGNITION:          Overall cognitive status: Within functional limits for tasks assessed                        SENSATION:          Light touch: Appears intact          Stereognosis: Appears intact          Hot/Cold: Appears intact          Proprioception: Appears intact   MUSCLE LENGTH: Hamstrings: WNL Thomas test: WNL   POSTURE:  Slender  slight  forward head   PALPATION: TTP -slight tenderness over superior pole of patella but generally no pain   LE AROM/PROM:   A/PROM Right 06/23/2021 Left 06/23/2021  Hip flexion 110 112  Hip extension      Hip abduction      Hip adduction      Hip internal rotation      Hip external rotation      Knee flexion 140/P142 140 P144  Knee extension 0 0  Ankle dorsiflexion      Ankle plantarflexion      Ankle inversion      Ankle eversion       (Blank rows = not tested)   LE MMT:   MMT Right 06/23/2021 Left 06/23/2021 Right  07/11/21 Left 07/11/21 Right 07/29/21 Left 07/29/21 Right  08/01/21  Hip flexion 4+/5 4+/5       Hip extension 4+/5 4+/5       Hip abduction 4-/5 4-/5 4-/5 4-/5 4/5 4/5 4+/5  Hip adduction           Hip internal rotation           Hip external rotation           Knee flexion 4+/5 4+/5       Knee extension 4+/5 4+/5        Ankle dorsiflexion           Ankle plantarflexion 5/5 5/5       Ankle inversion           Ankle eversion            (Blank rows = not tested)   LOWER EXTREMITY SPECIAL TESTS:  Knee special tests: Anterior drawer test: negative, Thessaly test: negative, and Step up/down test: positive  Pain on R and knee valgus with step down.   FUNCTIONAL TESTS:  5 times sit to stand: 16.76 sec Squat- pt pitches forward and has reduced Hip hinge and inability  to squat at knee flexion 90 degrees ( 60 degrees) 07/23/2021: 5xSTS 8.8 seconds   GAIT: Distance walked: 150 Assistive device utilized: None Level of assistance: Complete Independence Comments: Normal gait         TODAY'S TREATMENT:  [facility] Adult PT Treatment:                                                DATE: 08/01/2021 Therapeutic Exercise: MMT 4+/5 hip abduction right, left 4/5 Standing 2 way hip flex, abdct/ext blue  band with SLS x 15 each bilat  90/90 Goblet squat 25# KB x 10 x2- blue band at knees Step ups 8" step holding 15# KB x 15 BIL 6 inch step  down x10 each Side squat with blue band 20 feet each way   [facility] Adult PT Treatment:                                                DATE: 07/29/2021 Therapeutic Exercise: MMT 4/5 hip abduction Standing 2 way hip abdct/ext red band with SLS  Stanidng hip abdct with SLS on foam red band x 10 each -light touch  90/90 Goblet squat 25# KB x 10 x2 Step ups 8" step holding 15# KB x 15 BIL  4 inch step  down x10 each Side lying glut med hip abduction 3# 10 x 3 each    [facility] Adult PT Treatment:                                                DATE: 07/27/2021 Therapeutic Exercise: Goblet squat 20# KB x 10 to regular 18" seat=-attempted 16 inch -unable to do with 25#   Standing hip abduction 17.5#  2 x 10 BIL Standing hip extension 17.5# 2 x 10 BIL Step ups 8" step holding 15# KB x 15 BIL 4 inch step  down x10 each 6 inch step down x 10 each  Side stepping squat with BlueTB around  knees 2 x 45"    [facility] Adult PT Treatment:                                                DATE: 07/22/2021 Therapeutic Exercise: Goblet squat 20# KB x 10 to regular 18" seat Step ups 8" step holding 15# KB x 10 BIL Heel taps from 4" step x 10 BIL Standing hip abduction 17.5#  x 10 BIL Standing hip extension 17.5# x 10 BIL Sidelying hip abduction circles -stir the pot 10 x 2 each 2# CW/CCW Single leg bridge x 10 Side stepping squat with BlueTB around knees 2 x 30' Childs pose x 30" Front plank on elbows 2 x 30"    [facility] Adult PT Treatment:                                                DATE: 07-19-21 Therapeutic Exercise: Teaching stretches 1: plank to downdog, Best overall stretch (lunge with thoracic rotataion) to Mini Emom   3 rounds     Goblet squat (regular seat 18 inch)20 # KB x 10 Step ups on 8 inch step 10 on R and 10 on L  ( can begin carrying KB starting at 15 lb  Single arm lat pul  BTB x 15 R and L Single leg bridge x 10 each off the edge of the mat for more ROM  Deadlift 45# 10 x 2       PATIENT EDUCATION:  Education details: Review of HEP Person educated: Patient Education method: Explanation, Demonstration, Tactile cues, and Verbal cues Education comprehension: verbalized understanding, returned demonstration, and needs further education     HOME EXERCISE PROGRAM: Access Code: [email or web address] URL: [email or web address] Date: 06/30/2021 Prepared by: Voncille Lo  Program Notes  EMOM Goblet squat starts elevated with chair.  Goblet squat (regular seat 18 inch)15 # KB x 10 Single arm lat pul  BTB x 15 R and L Step ups on 8 inch step 10 on R and 10 on L  ( can begin carrying KB starting at 10 lb and progressing) Front plank  up to a 30 sec up to  minute   Exercises Goblet Squat with Kettlebell - 1 x daily - 7 x weekly - 3 sets - 10 reps Supine Hip Flexion with Resistance Loop - 1 x daily - 7 x weekly - 3 sets - 10  reps Side Stepping with  Resistance at Ankles - 1 x daily - 7 x weekly - 3 sets - 10 reps Single Leg Heel Raise with Counter Support - 1 x daily - 7 x weekly - 1 sets - 30 reps Hip Extension with Resistance Loop - 1 x daily - 7 x weekly - 3 sets - 10 reps Hip Abduction with Resistance Loop - 1 x daily - 7 x weekly - 3 sets - 10 reps    ASSESSMENT:   CLINICAL IMPRESSION: Pt reports 3/10 max pain with carrying several rounds of groceries up the steps. Without groceries, the steps are not painful. She reports 75% overall improvement since starting PT. She has not returned to walking but plans to start. She has met most LTGS and is ready to resume weight training class and continue her other community fitness.     REHAB POTENTIAL: Excellent   CLINICAL DECISION MAKING: Stable/uncomplicated   EVALUATION COMPLEXITY: Low     GOALS: Goals reviewed with patient? Yes   SHORT TERM GOALS:   STG Name Target Date Goal status  1 Pt will be independent with HEP Baseline: limited knowledge 07/14/2021 MET  2 Pt will be able to show reduced risk of fall with 5 x STS 13.0 sec or less Baseline:  eval 16.76 sec Current 07/23/2021 8.8 seconds 07/14/2021 MET  3 Pain will decrease to 1/10 with all functional activities Baseline: pain to 4/10 with exercise and daily activities;07/14/21 pain up to 2/10 with step ups in clinc Status: 07/27/21: 1-2/10 pain with steps  Status 08/01/21- 3/10 pain at most with carrying grocerries on stairs  07/14/2021 Partially met  MET  4 Pt will return to walking 3 times a week for at least 3 miles Baseline: Pt has stopped walking due to fear of injuring knee; status: 07/14/21 has not returned to walking 07/27/21: plans to return to walking soon 07/14/2021 NOT MET    LONG TERM GOALS:    LTG Name Target Date Goal status  1 Pt will be independent with advanced HEP Baseline:no knowledge of progressive overload 08/04/2021 MET  2 Pt will be able to demonstrate Goblet squats with 90/90 hip flex and knee while utilizing  25 # or more with symmetrical form Baseline: able to squat to chair with 20# 08/04/2021 Met 07/29/21  3 Pt will be able to deadlift  55 lb to show increased LE strength Baseline: 45 lb deadlift 08/04/2021 Met 07/29/21  4 Pt will be able to negotiate stairs without exacerbating pain. Baseline: eval 4/10 pain  minimal pain to 0/10 Status 07/27/21: 1-2/10 pain with stairs Status: 08/01/21: no pain on stairs  08/04/2021 MET 08/01/21  5 FOTO will improve from 72%   to  79 %  indicating improved functional mobility Baseline:Eval 72% 07/23/2021: 73% 08/01/21: 85% 08/04/2021 Met 08/01/21  6 Pt will demonstrate floor to stand transfer to show evidence of reduced fall risk and to be able to participate in yoga and pilates pain free Baseline: able to lunge stretch and do tall plank to downward dog   08/04/2021 Met 07/29/21    PLAN: PT FREQUENCY: 2x/week   PT DURATION: 6 weeks   PLANNED INTERVENTIONS: Therapeutic exercises, Therapeutic activity, Neuro Muscular re-education, Balance training, Gait training, Patient/Family education, Joint mobilization, Stair training, Dry Needling, Electrical stimulation, Cryotherapy, Moist heat, Taping, Ionotophoresis 47m/ml Dexamethasone, and Manual therapy   PLAN FOR NEXT SESSION: Discharge today   JHessie Diener PTA 08/01/21 11:42 AM Phone: [phone]: [phone]  For discharge  Voncille Lo, PT, [facility] Certified Exercise Expert for the Aging Adult  08/02/21 8:14 PM Phone: [phone] Fax: [phone]

## 2021-08-04 ENCOUNTER — Ambulatory Visit: Payer: Medicare Other | Admitting: Physical Therapy

## 2022-01-30 ENCOUNTER — Encounter: Payer: Self-pay | Admitting: Podiatry

## 2022-01-30 ENCOUNTER — Ambulatory Visit (INDEPENDENT_AMBULATORY_CARE_PROVIDER_SITE_OTHER): Payer: Medicare Other | Admitting: Podiatry

## 2022-01-30 DIAGNOSIS — B351 Tinea unguium: Secondary | ICD-10-CM | POA: Diagnosis not present

## 2022-01-30 MED ORDER — TERBINAFINE HCL 250 MG PO TABS: 250.0000 mg | ORAL_TABLET | Freq: Every day | ORAL | 0 refills | Status: AC

## 2022-01-30 NOTE — Progress Notes (Signed)
  Subjective:  Patient ID: Lisa Bauer, female    DOB: 1955-07-20,  MRN: [id number]  Chief Complaint  Patient presents with   Nail Problem    Nail fungus- thick toenails/painful. 2nd toe nail to left foot and right foot. Had for a couple of years. Has not tried any OTC medications.     66 y.o. female presents with the above complaint. History confirmed with patient.   Objective:  Physical Exam: warm, good capillary refill, no trophic changes or ulcerative lesions, normal DP and PT pulses, and normal sensory exam. Left Foot:  Second and fifth toenails yellow dystrophic with subungual debris there is a large complaint and acute the second toenail Right Foot: Third and fifth toenails yellow dystrophic with subungual debris     Assessment:   1. Onychomycosis      Plan:  Patient was evaluated and treated and all questions answered.  We discussed etiology treatment options of what is the likely etiology of onychomycosis.  A fungal sample was taken and sent for culture.  Lamisil Rx sent to pharmacy.  We discussed the risk and benefits of this.  She does have a history of a hepatic abscess that has not been an issue for her.  This has been a remote issue.  She recently had lab work done in reportedly everything was normal.  We may consider LFT checks after her first round of Lamisil in 3 months.  We also discussed laser treatment.  Photographs were taken.  I will see her back in 4 months  Return in about 4 months (around 06/01/2022) for follow up after nail fungus treatment.

## 2022-02-08 ENCOUNTER — Telehealth: Payer: Self-pay | Admitting: *Deleted

## 2022-02-08 NOTE — Telephone Encounter (Signed)
Lisa Bauer is wanting to know how many bags were sent on Aug. 28 th , not listed on requisition paperwork.

## 2022-06-27 ENCOUNTER — Other Ambulatory Visit: Payer: Self-pay | Admitting: Internal Medicine

## 2022-06-27 DIAGNOSIS — E2839 Other primary ovarian failure: Secondary | ICD-10-CM

## 2022-07-28 ENCOUNTER — Encounter (HOSPITAL_COMMUNITY): Payer: Self-pay | Admitting: Internal Medicine

## 2022-07-28 ENCOUNTER — Other Ambulatory Visit (HOSPITAL_COMMUNITY): Payer: Self-pay | Admitting: Internal Medicine

## 2022-07-28 ENCOUNTER — Ambulatory Visit (HOSPITAL_BASED_OUTPATIENT_CLINIC_OR_DEPARTMENT_OTHER)
Admission: RE | Admit: 2022-07-28 | Discharge: 2022-07-28 | Disposition: A | Discharge status: Home / Self Care | Payer: Medicare Other | Source: Ambulatory Visit | Attending: Internal Medicine | Admitting: Internal Medicine

## 2022-07-28 DIAGNOSIS — R109 Unspecified abdominal pain: Secondary | ICD-10-CM

## 2022-07-28 MED ORDER — IOHEXOL 300 MG/ML  SOLN
100.0000 mL | Freq: Once | INTRAMUSCULAR | Status: AC | PRN
  Administered 2022-07-28: 75 mL via INTRAVENOUS

## 2022-09-06 ENCOUNTER — Other Ambulatory Visit: Payer: TRICARE For Life (TFL)

## 2022-09-11 ENCOUNTER — Ambulatory Visit
Admission: RE | Admit: 2022-09-11 | Discharge: 2022-09-11 | Disposition: A | Discharge status: Home / Self Care | Payer: Medicare Other | Source: Ambulatory Visit | Attending: Internal Medicine | Admitting: Internal Medicine

## 2022-09-11 DIAGNOSIS — E2839 Other primary ovarian failure: Secondary | ICD-10-CM
